# Patient Record
Sex: Male | Born: 1940 | Race: White | Hispanic: No | Marital: Married | Smoking: Former smoker
Health system: Southern US, Community
[De-identification: ages and names within clinical notes are randomized; demographics above are authoritative.]

## PROBLEM LIST (undated history)

## (undated) DIAGNOSIS — R0789 Other chest pain: Secondary | ICD-10-CM

## (undated) DIAGNOSIS — N390 Urinary tract infection, site not specified: Secondary | ICD-10-CM

## (undated) DIAGNOSIS — H9190 Unspecified hearing loss, unspecified ear: Secondary | ICD-10-CM

## (undated) DIAGNOSIS — F329 Major depressive disorder, single episode, unspecified: Secondary | ICD-10-CM

## (undated) DIAGNOSIS — F32A Depression, unspecified: Secondary | ICD-10-CM

## (undated) DIAGNOSIS — M109 Gout, unspecified: Secondary | ICD-10-CM

## (undated) DIAGNOSIS — B351 Tinea unguium: Secondary | ICD-10-CM

## (undated) DIAGNOSIS — E785 Hyperlipidemia, unspecified: Secondary | ICD-10-CM

## (undated) DIAGNOSIS — E11319 Type 2 diabetes mellitus with unspecified diabetic retinopathy without macular edema: Secondary | ICD-10-CM

## (undated) DIAGNOSIS — I10 Essential (primary) hypertension: Secondary | ICD-10-CM

## (undated) DIAGNOSIS — E119 Type 2 diabetes mellitus without complications: Secondary | ICD-10-CM

## (undated) HISTORY — DX: Depression, unspecified: F32.A

## (undated) HISTORY — DX: Tinea unguium: B35.1

## (undated) HISTORY — PX: COLONOSCOPY: SHX174

## (undated) HISTORY — PX: CHOLECYSTECTOMY: SHX55

## (undated) HISTORY — DX: Unspecified hearing loss, unspecified ear: H91.90

## (undated) HISTORY — DX: Other chest pain: R07.89

## (undated) HISTORY — DX: Urinary tract infection, site not specified: N39.0

## (undated) HISTORY — DX: Essential (primary) hypertension: I10

## (undated) HISTORY — PX: TONSILLECTOMY: SUR1361

## (undated) HISTORY — DX: Gout, unspecified: M10.9

## (undated) HISTORY — DX: Type 2 diabetes mellitus without complications: E11.9

## (undated) HISTORY — DX: Major depressive disorder, single episode, unspecified: F32.9

## (undated) HISTORY — PX: CATARACT EXTRACTION, BILATERAL: SHX1313

---

## 1998-05-25 ENCOUNTER — Encounter: Payer: Self-pay | Admitting: Cardiology

## 1998-05-25 ENCOUNTER — Ambulatory Visit (HOSPITAL_COMMUNITY): Admission: RE | Admit: 1998-05-25 | Discharge: 1998-05-25 | Payer: Self-pay | Admitting: Cardiology

## 2004-02-23 ENCOUNTER — Encounter (INDEPENDENT_AMBULATORY_CARE_PROVIDER_SITE_OTHER): Payer: Self-pay | Admitting: *Deleted

## 2004-02-23 ENCOUNTER — Ambulatory Visit (HOSPITAL_COMMUNITY): Admission: RE | Admit: 2004-02-23 | Discharge: 2004-02-23 | Payer: Self-pay | Admitting: Gastroenterology

## 2008-11-02 ENCOUNTER — Observation Stay (HOSPITAL_COMMUNITY): Admission: EM | Admit: 2008-11-02 | Discharge: 2008-11-03 | Payer: Self-pay | Admitting: Emergency Medicine

## 2010-07-24 LAB — CBC
HCT: 42.7 % (ref 39.0–52.0)
Hemoglobin: 14.6 g/dL (ref 13.0–17.0)
MCHC: 34.2 g/dL (ref 30.0–36.0)
MCV: 89.5 fL (ref 78.0–100.0)
Platelets: 264 10*3/uL (ref 150–400)
RBC: 4.77 MIL/uL (ref 4.22–5.81)
RDW: 13.7 % (ref 11.5–15.5)
WBC: 7.9 10*3/uL (ref 4.0–10.5)

## 2010-07-24 LAB — DIFFERENTIAL
Basophils Absolute: 0.1 10*3/uL (ref 0.0–0.1)
Basophils Relative: 2 % — ABNORMAL HIGH (ref 0–1)
Eosinophils Absolute: 0.1 10*3/uL (ref 0.0–0.7)
Monocytes Absolute: 0.7 10*3/uL (ref 0.1–1.0)
Monocytes Relative: 9 % (ref 3–12)
Neutrophils Relative %: 59 % (ref 43–77)

## 2010-07-24 LAB — POCT I-STAT, CHEM 8
Calcium, Ion: 1.19 mmol/L (ref 1.12–1.32)
Glucose, Bld: 156 mg/dL — ABNORMAL HIGH (ref 70–99)
HCT: 44 % (ref 39.0–52.0)
Hemoglobin: 15 g/dL (ref 13.0–17.0)
Potassium: 4.1 mEq/L (ref 3.5–5.1)

## 2010-07-24 LAB — GLUCOSE, CAPILLARY: Glucose-Capillary: 163 mg/dL — ABNORMAL HIGH (ref 70–99)

## 2010-08-30 NOTE — Op Note (Signed)
NAME:  TATE, ZAGAL NO.:  [id number]   MEDICAL RECORD NO.:  [id number]          PATIENT TYPE:  OBV   LOCATION:  0098                         FACILITY:  [facility]   PHYSICIAN:  Artist Pais. Weingold, M.D.DATE OF BIRTH:  April 13, 1941   DATE OF PROCEDURE:  11/02/2008  DATE OF DISCHARGE:                               OPERATIVE REPORT   PREOPERATIVE DIAGNOSIS:  Right long, ring, and small finger deep  laceration, status post chain saw injury.   POSTOPERATIVE DIAGNOSIS:  Right long, ring, and small finger deep  laceration, status post chain saw injury.   OPERATION/PROCEDURE:  Exploration and repair right small finger ulnar  digital artery and nerve and right ring and long finger radial digital  artery and nerve.   ANESTHESIA:  General.   TOURNIQUET TIME:  Two hours.   COMPLICATIONS:  None.   DRAINS:  None.   DESCRIPTION OF PROCEDURE:  The patient taken to operating suite.  After  induction of adequate general anesthesia, right upper extremity is  prepped and draped in sterile fashion.  An Esmarch was used to  exsanguinate the limb.  Tourniquet inflated to 265 mmHg.  At this point  in time the right hand was explored.  There were lacerations to the  long, ring and small fingers.  They were each extended proximally and  distally in gentle S type fashion.  Visualization revealed complete  lacerations of the radial, neurovascular bundles of the long ring finger  and the ulnar neurovascular bundle of the small finger.  Tendons were  all intact.  Microscope was brought into the field.  Under microscopic  magnification, the ulnar artery and ulnar nerves were repaired using a  10-0 nylon in the digital arteries and 9-0 nylon on the digital nerves  on the radial side of the long and ring fingers and ulnar side of the  small finger.  After this was done, wound was irrigated and loosely  closed with 4-0 Vicryl Rapide suture.  Xeroform, 4 x 4s, fluffs and a  dorsal extension  block splint were applied.  The patient tolerated micro  procedures well and went to the recovery room in stable condition.      Artist Pais Mina Marble, M.D.  Electronically Signed     MAW/MEDQ  D:  11/02/2008  T:  11/03/2008  Job:  [id number]

## 2010-08-30 NOTE — Consult Note (Signed)
NAME:  Vincent Alvarez, Vincent Alvarez NO.:  [id number]   MEDICAL RECORD NO.:  [id number]          PATIENT TYPE:  OBV   LOCATION:  0098                         [facility]ACILITY:  [facility]   PHYSICIAN:  Artist Pais. Weingold, M.D.DATE OF BIRTH:  02/20/1941   DATE OF CONSULTATION:  DATE OF DISCHARGE:                                 CONSULTATION   REASON FOR CONSULTATION:  [name] is a 70 year old right-hand  dominant male who was working with a chain saw, accidentally lacerated  the right hand of long, ring and small fingers over the proximal  phalanges with arterial bleeding noted in the emergency room.  He is an  otherwise healthy 70 year old male.   ALLERGIES:  He has no known drug allergies.   MEDICATIONS:  He is currently on, Glucophage, Januvia, Actos, Prozac and  one antihypertensive which he cannot recall the name of.   ALLERGIES:  NO KNOWN DRUG ALLERGIES.   SOCIAL HISTORY:  He occasionally drinks alcohol, does not smoke.   PAST MEDICAL HISTORY:  Non-insulin diabetes mellitus and hypertension.   PHYSICAL EXAMINATION:  GENERAL:  A well-nourished male, pleasant, alert  and oriented x3.  Has deep lacerations of long, ring and small fingers.  The radial side of the long and ring finger, ulnar side of the small  finger with arterial bleeding.  X-ray showed small possible fracture of  the corners of the proximal phalanges of long and ring finger.  No other  significant findings.   HISTORY OF PRESENT ILLNESS:  A 70 year old male with deep lacerations to  the volar aspect of his dominant right long, ring and small fingers with  probable tendon, artery and nerve damage.  I discussed with both Mr.  Smolinsky and his wife we will take him to the operating room to explore  and repair as necessary the above injuries.      Artist Pais Mina Marble, M.D.  Electronically Signed     MAW/MEDQ  D:  11/02/2008  T:  11/03/2008  Job:  [id number]

## 2011-05-02 DIAGNOSIS — E1139 Type 2 diabetes mellitus with other diabetic ophthalmic complication: Secondary | ICD-10-CM | POA: Diagnosis not present

## 2011-05-02 DIAGNOSIS — E11319 Type 2 diabetes mellitus with unspecified diabetic retinopathy without macular edema: Secondary | ICD-10-CM | POA: Diagnosis not present

## 2011-05-25 DIAGNOSIS — H40009 Preglaucoma, unspecified, unspecified eye: Secondary | ICD-10-CM | POA: Diagnosis not present

## 2011-05-25 DIAGNOSIS — E1139 Type 2 diabetes mellitus with other diabetic ophthalmic complication: Secondary | ICD-10-CM | POA: Diagnosis not present

## 2011-06-06 DIAGNOSIS — H40009 Preglaucoma, unspecified, unspecified eye: Secondary | ICD-10-CM | POA: Diagnosis not present

## 2011-06-06 DIAGNOSIS — H2510 Age-related nuclear cataract, unspecified eye: Secondary | ICD-10-CM | POA: Diagnosis not present

## 2011-06-06 DIAGNOSIS — E1139 Type 2 diabetes mellitus with other diabetic ophthalmic complication: Secondary | ICD-10-CM | POA: Diagnosis not present

## 2011-08-28 DIAGNOSIS — E119 Type 2 diabetes mellitus without complications: Secondary | ICD-10-CM | POA: Diagnosis not present

## 2011-08-28 DIAGNOSIS — E785 Hyperlipidemia, unspecified: Secondary | ICD-10-CM | POA: Diagnosis not present

## 2011-08-28 DIAGNOSIS — I10 Essential (primary) hypertension: Secondary | ICD-10-CM | POA: Diagnosis not present

## 2011-08-28 DIAGNOSIS — B351 Tinea unguium: Secondary | ICD-10-CM | POA: Diagnosis not present

## 2011-09-13 DIAGNOSIS — B351 Tinea unguium: Secondary | ICD-10-CM | POA: Diagnosis not present

## 2011-09-13 DIAGNOSIS — M79609 Pain in unspecified limb: Secondary | ICD-10-CM | POA: Diagnosis not present

## 2011-12-05 DIAGNOSIS — H40019 Open angle with borderline findings, low risk, unspecified eye: Secondary | ICD-10-CM | POA: Diagnosis not present

## 2011-12-05 DIAGNOSIS — H40059 Ocular hypertension, unspecified eye: Secondary | ICD-10-CM | POA: Diagnosis not present

## 2012-01-30 DIAGNOSIS — E1139 Type 2 diabetes mellitus with other diabetic ophthalmic complication: Secondary | ICD-10-CM | POA: Diagnosis not present

## 2012-01-30 DIAGNOSIS — E11319 Type 2 diabetes mellitus with unspecified diabetic retinopathy without macular edema: Secondary | ICD-10-CM | POA: Diagnosis not present

## 2012-02-21 DIAGNOSIS — M542 Cervicalgia: Secondary | ICD-10-CM | POA: Diagnosis not present

## 2012-02-27 DIAGNOSIS — R011 Cardiac murmur, unspecified: Secondary | ICD-10-CM | POA: Diagnosis not present

## 2012-02-27 DIAGNOSIS — E119 Type 2 diabetes mellitus without complications: Secondary | ICD-10-CM | POA: Diagnosis not present

## 2012-02-27 DIAGNOSIS — Z23 Encounter for immunization: Secondary | ICD-10-CM | POA: Diagnosis not present

## 2012-02-27 DIAGNOSIS — Z125 Encounter for screening for malignant neoplasm of prostate: Secondary | ICD-10-CM | POA: Diagnosis not present

## 2012-02-27 DIAGNOSIS — R030 Elevated blood-pressure reading, without diagnosis of hypertension: Secondary | ICD-10-CM | POA: Diagnosis not present

## 2012-02-27 DIAGNOSIS — Z0000 Encounter for general adult medical examination without abnormal findings: Secondary | ICD-10-CM | POA: Diagnosis not present

## 2012-03-01 DIAGNOSIS — R011 Cardiac murmur, unspecified: Secondary | ICD-10-CM | POA: Diagnosis not present

## 2012-03-06 ENCOUNTER — Other Ambulatory Visit: Payer: Self-pay | Admitting: Gastroenterology

## 2012-03-06 DIAGNOSIS — Z86010 Personal history of colonic polyps: Secondary | ICD-10-CM | POA: Diagnosis not present

## 2012-03-06 DIAGNOSIS — Z09 Encounter for follow-up examination after completed treatment for conditions other than malignant neoplasm: Secondary | ICD-10-CM | POA: Diagnosis not present

## 2012-03-06 DIAGNOSIS — D126 Benign neoplasm of colon, unspecified: Secondary | ICD-10-CM | POA: Diagnosis not present

## 2012-03-06 DIAGNOSIS — Z800 Family history of malignant neoplasm of digestive organs: Secondary | ICD-10-CM | POA: Diagnosis not present

## 2012-06-11 DIAGNOSIS — E1139 Type 2 diabetes mellitus with other diabetic ophthalmic complication: Secondary | ICD-10-CM | POA: Diagnosis not present

## 2012-06-11 DIAGNOSIS — H2510 Age-related nuclear cataract, unspecified eye: Secondary | ICD-10-CM | POA: Diagnosis not present

## 2012-06-11 DIAGNOSIS — H52209 Unspecified astigmatism, unspecified eye: Secondary | ICD-10-CM | POA: Diagnosis not present

## 2012-06-11 DIAGNOSIS — E11319 Type 2 diabetes mellitus with unspecified diabetic retinopathy without macular edema: Secondary | ICD-10-CM | POA: Diagnosis not present

## 2012-08-08 DIAGNOSIS — S298XXA Other specified injuries of thorax, initial encounter: Secondary | ICD-10-CM | POA: Diagnosis not present

## 2012-08-08 DIAGNOSIS — E119 Type 2 diabetes mellitus without complications: Secondary | ICD-10-CM | POA: Diagnosis not present

## 2012-08-26 DIAGNOSIS — E119 Type 2 diabetes mellitus without complications: Secondary | ICD-10-CM | POA: Diagnosis not present

## 2012-08-26 DIAGNOSIS — F329 Major depressive disorder, single episode, unspecified: Secondary | ICD-10-CM | POA: Diagnosis not present

## 2012-08-26 DIAGNOSIS — E785 Hyperlipidemia, unspecified: Secondary | ICD-10-CM | POA: Diagnosis not present

## 2012-08-26 DIAGNOSIS — I10 Essential (primary) hypertension: Secondary | ICD-10-CM | POA: Diagnosis not present

## 2012-10-09 ENCOUNTER — Other Ambulatory Visit: Payer: Self-pay | Admitting: Gastroenterology

## 2012-10-09 DIAGNOSIS — Z86010 Personal history of colonic polyps: Secondary | ICD-10-CM | POA: Diagnosis not present

## 2012-10-09 DIAGNOSIS — D126 Benign neoplasm of colon, unspecified: Secondary | ICD-10-CM | POA: Diagnosis not present

## 2012-10-09 DIAGNOSIS — K620 Anal polyp: Secondary | ICD-10-CM | POA: Diagnosis not present

## 2012-10-09 DIAGNOSIS — K621 Rectal polyp: Secondary | ICD-10-CM | POA: Diagnosis not present

## 2012-10-09 DIAGNOSIS — Z09 Encounter for follow-up examination after completed treatment for conditions other than malignant neoplasm: Secondary | ICD-10-CM | POA: Diagnosis not present

## 2013-01-30 DIAGNOSIS — Z23 Encounter for immunization: Secondary | ICD-10-CM | POA: Diagnosis not present

## 2013-03-19 DIAGNOSIS — N400 Enlarged prostate without lower urinary tract symptoms: Secondary | ICD-10-CM | POA: Diagnosis not present

## 2013-03-19 DIAGNOSIS — Z125 Encounter for screening for malignant neoplasm of prostate: Secondary | ICD-10-CM | POA: Diagnosis not present

## 2013-03-19 DIAGNOSIS — M109 Gout, unspecified: Secondary | ICD-10-CM | POA: Diagnosis not present

## 2013-03-19 DIAGNOSIS — Z0000 Encounter for general adult medical examination without abnormal findings: Secondary | ICD-10-CM | POA: Diagnosis not present

## 2013-03-19 DIAGNOSIS — E119 Type 2 diabetes mellitus without complications: Secondary | ICD-10-CM | POA: Diagnosis not present

## 2013-03-20 ENCOUNTER — Other Ambulatory Visit: Payer: Self-pay | Admitting: Family Medicine

## 2013-03-20 DIAGNOSIS — Z139 Encounter for screening, unspecified: Secondary | ICD-10-CM

## 2013-03-26 ENCOUNTER — Ambulatory Visit
Admission: RE | Admit: 2013-03-26 | Discharge: 2013-03-26 | Disposition: A | Discharge status: Home / Self Care | Payer: Medicare Other | Source: Ambulatory Visit | Attending: Family Medicine | Admitting: Family Medicine

## 2013-03-26 DIAGNOSIS — Z139 Encounter for screening, unspecified: Secondary | ICD-10-CM

## 2013-03-26 DIAGNOSIS — Z136 Encounter for screening for cardiovascular disorders: Secondary | ICD-10-CM | POA: Diagnosis not present

## 2013-03-27 DIAGNOSIS — S90129A Contusion of unspecified lesser toe(s) without damage to nail, initial encounter: Secondary | ICD-10-CM | POA: Diagnosis not present

## 2013-03-27 DIAGNOSIS — S9030XA Contusion of unspecified foot, initial encounter: Secondary | ICD-10-CM | POA: Diagnosis not present

## 2013-06-17 DIAGNOSIS — E11319 Type 2 diabetes mellitus with unspecified diabetic retinopathy without macular edema: Secondary | ICD-10-CM | POA: Diagnosis not present

## 2013-06-17 DIAGNOSIS — H40019 Open angle with borderline findings, low risk, unspecified eye: Secondary | ICD-10-CM | POA: Diagnosis not present

## 2013-06-17 DIAGNOSIS — E1139 Type 2 diabetes mellitus with other diabetic ophthalmic complication: Secondary | ICD-10-CM | POA: Diagnosis not present

## 2013-06-17 DIAGNOSIS — H2510 Age-related nuclear cataract, unspecified eye: Secondary | ICD-10-CM | POA: Diagnosis not present

## 2013-07-30 DIAGNOSIS — H6120 Impacted cerumen, unspecified ear: Secondary | ICD-10-CM | POA: Diagnosis not present

## 2013-07-30 DIAGNOSIS — H903 Sensorineural hearing loss, bilateral: Secondary | ICD-10-CM | POA: Diagnosis not present

## 2013-09-18 DIAGNOSIS — I10 Essential (primary) hypertension: Secondary | ICD-10-CM | POA: Diagnosis not present

## 2013-09-18 DIAGNOSIS — Z23 Encounter for immunization: Secondary | ICD-10-CM | POA: Diagnosis not present

## 2013-09-18 DIAGNOSIS — F3289 Other specified depressive episodes: Secondary | ICD-10-CM | POA: Diagnosis not present

## 2013-09-18 DIAGNOSIS — J309 Allergic rhinitis, unspecified: Secondary | ICD-10-CM | POA: Diagnosis not present

## 2013-09-18 DIAGNOSIS — F329 Major depressive disorder, single episode, unspecified: Secondary | ICD-10-CM | POA: Diagnosis not present

## 2013-09-18 DIAGNOSIS — N400 Enlarged prostate without lower urinary tract symptoms: Secondary | ICD-10-CM | POA: Diagnosis not present

## 2013-09-18 DIAGNOSIS — E119 Type 2 diabetes mellitus without complications: Secondary | ICD-10-CM | POA: Diagnosis not present

## 2013-09-18 DIAGNOSIS — B351 Tinea unguium: Secondary | ICD-10-CM | POA: Diagnosis not present

## 2013-12-16 DIAGNOSIS — E1139 Type 2 diabetes mellitus with other diabetic ophthalmic complication: Secondary | ICD-10-CM | POA: Diagnosis not present

## 2013-12-19 ENCOUNTER — Encounter: Payer: Self-pay | Admitting: *Deleted

## 2014-01-19 DIAGNOSIS — Z23 Encounter for immunization: Secondary | ICD-10-CM | POA: Diagnosis not present

## 2014-03-24 DIAGNOSIS — Z0000 Encounter for general adult medical examination without abnormal findings: Secondary | ICD-10-CM | POA: Diagnosis not present

## 2014-03-24 DIAGNOSIS — E119 Type 2 diabetes mellitus without complications: Secondary | ICD-10-CM | POA: Diagnosis not present

## 2014-03-24 DIAGNOSIS — Z125 Encounter for screening for malignant neoplasm of prostate: Secondary | ICD-10-CM | POA: Diagnosis not present

## 2014-03-24 DIAGNOSIS — E785 Hyperlipidemia, unspecified: Secondary | ICD-10-CM | POA: Diagnosis not present

## 2014-03-24 DIAGNOSIS — I10 Essential (primary) hypertension: Secondary | ICD-10-CM | POA: Diagnosis not present

## 2014-07-14 DIAGNOSIS — E11339 Type 2 diabetes mellitus with moderate nonproliferative diabetic retinopathy without macular edema: Secondary | ICD-10-CM | POA: Diagnosis not present

## 2014-08-03 DIAGNOSIS — E11339 Type 2 diabetes mellitus with moderate nonproliferative diabetic retinopathy without macular edema: Secondary | ICD-10-CM | POA: Diagnosis not present

## 2014-08-03 DIAGNOSIS — H52203 Unspecified astigmatism, bilateral: Secondary | ICD-10-CM | POA: Diagnosis not present

## 2014-08-03 DIAGNOSIS — H2513 Age-related nuclear cataract, bilateral: Secondary | ICD-10-CM | POA: Diagnosis not present

## 2014-08-03 DIAGNOSIS — H40013 Open angle with borderline findings, low risk, bilateral: Secondary | ICD-10-CM | POA: Diagnosis not present

## 2014-09-23 DIAGNOSIS — E11319 Type 2 diabetes mellitus with unspecified diabetic retinopathy without macular edema: Secondary | ICD-10-CM | POA: Diagnosis not present

## 2014-11-23 DIAGNOSIS — R21 Rash and other nonspecific skin eruption: Secondary | ICD-10-CM | POA: Diagnosis not present

## 2014-12-31 DIAGNOSIS — H43813 Vitreous degeneration, bilateral: Secondary | ICD-10-CM | POA: Diagnosis not present

## 2014-12-31 DIAGNOSIS — E11339 Type 2 diabetes mellitus with moderate nonproliferative diabetic retinopathy without macular edema: Secondary | ICD-10-CM | POA: Diagnosis not present

## 2015-02-02 DIAGNOSIS — Z23 Encounter for immunization: Secondary | ICD-10-CM | POA: Diagnosis not present

## 2015-03-26 DIAGNOSIS — Z125 Encounter for screening for malignant neoplasm of prostate: Secondary | ICD-10-CM | POA: Diagnosis not present

## 2015-03-26 DIAGNOSIS — I10 Essential (primary) hypertension: Secondary | ICD-10-CM | POA: Diagnosis not present

## 2015-03-26 DIAGNOSIS — F324 Major depressive disorder, single episode, in partial remission: Secondary | ICD-10-CM | POA: Diagnosis not present

## 2015-03-26 DIAGNOSIS — E785 Hyperlipidemia, unspecified: Secondary | ICD-10-CM | POA: Diagnosis not present

## 2015-03-26 DIAGNOSIS — E11319 Type 2 diabetes mellitus with unspecified diabetic retinopathy without macular edema: Secondary | ICD-10-CM | POA: Diagnosis not present

## 2015-03-26 DIAGNOSIS — B351 Tinea unguium: Secondary | ICD-10-CM | POA: Diagnosis not present

## 2015-03-26 DIAGNOSIS — Z7984 Long term (current) use of oral hypoglycemic drugs: Secondary | ICD-10-CM | POA: Diagnosis not present

## 2015-03-26 DIAGNOSIS — N400 Enlarged prostate without lower urinary tract symptoms: Secondary | ICD-10-CM | POA: Diagnosis not present

## 2015-08-04 DIAGNOSIS — H524 Presbyopia: Secondary | ICD-10-CM | POA: Diagnosis not present

## 2015-08-04 DIAGNOSIS — E113393 Type 2 diabetes mellitus with moderate nonproliferative diabetic retinopathy without macular edema, bilateral: Secondary | ICD-10-CM | POA: Diagnosis not present

## 2015-08-04 DIAGNOSIS — H2513 Age-related nuclear cataract, bilateral: Secondary | ICD-10-CM | POA: Diagnosis not present

## 2015-08-19 DIAGNOSIS — H25811 Combined forms of age-related cataract, right eye: Secondary | ICD-10-CM | POA: Diagnosis not present

## 2015-08-19 DIAGNOSIS — H2511 Age-related nuclear cataract, right eye: Secondary | ICD-10-CM | POA: Diagnosis not present

## 2015-08-26 DIAGNOSIS — H2512 Age-related nuclear cataract, left eye: Secondary | ICD-10-CM | POA: Diagnosis not present

## 2015-08-26 DIAGNOSIS — H25812 Combined forms of age-related cataract, left eye: Secondary | ICD-10-CM | POA: Diagnosis not present

## 2015-09-14 DIAGNOSIS — J019 Acute sinusitis, unspecified: Secondary | ICD-10-CM | POA: Diagnosis not present

## 2015-09-14 DIAGNOSIS — J209 Acute bronchitis, unspecified: Secondary | ICD-10-CM | POA: Diagnosis not present

## 2015-09-28 DIAGNOSIS — E11319 Type 2 diabetes mellitus with unspecified diabetic retinopathy without macular edema: Secondary | ICD-10-CM | POA: Diagnosis not present

## 2015-09-28 DIAGNOSIS — I10 Essential (primary) hypertension: Secondary | ICD-10-CM | POA: Diagnosis not present

## 2015-09-28 DIAGNOSIS — Z7984 Long term (current) use of oral hypoglycemic drugs: Secondary | ICD-10-CM | POA: Diagnosis not present

## 2015-09-30 DIAGNOSIS — E113393 Type 2 diabetes mellitus with moderate nonproliferative diabetic retinopathy without macular edema, bilateral: Secondary | ICD-10-CM | POA: Diagnosis not present

## 2015-11-16 DIAGNOSIS — L509 Urticaria, unspecified: Secondary | ICD-10-CM | POA: Diagnosis not present

## 2015-11-18 DIAGNOSIS — R21 Rash and other nonspecific skin eruption: Secondary | ICD-10-CM | POA: Diagnosis not present

## 2015-11-18 DIAGNOSIS — L509 Urticaria, unspecified: Secondary | ICD-10-CM | POA: Diagnosis not present

## 2015-11-22 DIAGNOSIS — L509 Urticaria, unspecified: Secondary | ICD-10-CM | POA: Diagnosis not present

## 2015-11-22 DIAGNOSIS — R21 Rash and other nonspecific skin eruption: Secondary | ICD-10-CM | POA: Diagnosis not present

## 2015-11-22 DIAGNOSIS — Z7984 Long term (current) use of oral hypoglycemic drugs: Secondary | ICD-10-CM | POA: Diagnosis not present

## 2015-11-22 DIAGNOSIS — E11319 Type 2 diabetes mellitus with unspecified diabetic retinopathy without macular edema: Secondary | ICD-10-CM | POA: Diagnosis not present

## 2015-11-22 DIAGNOSIS — L239 Allergic contact dermatitis, unspecified cause: Secondary | ICD-10-CM | POA: Diagnosis not present

## 2015-12-18 ENCOUNTER — Emergency Department (HOSPITAL_COMMUNITY): Payer: Medicare Other

## 2015-12-18 ENCOUNTER — Encounter (HOSPITAL_COMMUNITY): Payer: Self-pay

## 2015-12-18 ENCOUNTER — Observation Stay (HOSPITAL_COMMUNITY)
Admission: EM | Admit: 2015-12-18 | Discharge: 2015-12-20 | Disposition: A | Discharge status: Home / Self Care | Payer: Medicare Other | Attending: Family Medicine | Admitting: Family Medicine

## 2015-12-18 DIAGNOSIS — G459 Transient cerebral ischemic attack, unspecified: Principal | ICD-10-CM | POA: Insufficient documentation

## 2015-12-18 DIAGNOSIS — E11319 Type 2 diabetes mellitus with unspecified diabetic retinopathy without macular edema: Secondary | ICD-10-CM | POA: Insufficient documentation

## 2015-12-18 DIAGNOSIS — I10 Essential (primary) hypertension: Secondary | ICD-10-CM | POA: Diagnosis not present

## 2015-12-18 DIAGNOSIS — Z66 Do not resuscitate: Secondary | ICD-10-CM | POA: Diagnosis not present

## 2015-12-18 DIAGNOSIS — Z6835 Body mass index (BMI) 35.0-35.9, adult: Secondary | ICD-10-CM | POA: Insufficient documentation

## 2015-12-18 DIAGNOSIS — R4701 Aphasia: Secondary | ICD-10-CM | POA: Insufficient documentation

## 2015-12-18 DIAGNOSIS — E1169 Type 2 diabetes mellitus with other specified complication: Secondary | ICD-10-CM | POA: Insufficient documentation

## 2015-12-18 DIAGNOSIS — R200 Anesthesia of skin: Secondary | ICD-10-CM | POA: Diagnosis not present

## 2015-12-18 DIAGNOSIS — Z7984 Long term (current) use of oral hypoglycemic drugs: Secondary | ICD-10-CM | POA: Diagnosis not present

## 2015-12-18 DIAGNOSIS — E669 Obesity, unspecified: Secondary | ICD-10-CM | POA: Insufficient documentation

## 2015-12-18 DIAGNOSIS — Z87891 Personal history of nicotine dependence: Secondary | ICD-10-CM | POA: Insufficient documentation

## 2015-12-18 DIAGNOSIS — F329 Major depressive disorder, single episode, unspecified: Secondary | ICD-10-CM

## 2015-12-18 DIAGNOSIS — Z79899 Other long term (current) drug therapy: Secondary | ICD-10-CM | POA: Insufficient documentation

## 2015-12-18 DIAGNOSIS — E119 Type 2 diabetes mellitus without complications: Secondary | ICD-10-CM

## 2015-12-18 DIAGNOSIS — G451 Carotid artery syndrome (hemispheric): Secondary | ICD-10-CM

## 2015-12-18 DIAGNOSIS — R29810 Facial weakness: Secondary | ICD-10-CM | POA: Diagnosis present

## 2015-12-18 DIAGNOSIS — F32A Depression, unspecified: Secondary | ICD-10-CM | POA: Diagnosis present

## 2015-12-18 DIAGNOSIS — E785 Hyperlipidemia, unspecified: Secondary | ICD-10-CM | POA: Insufficient documentation

## 2015-12-18 DIAGNOSIS — I639 Cerebral infarction, unspecified: Secondary | ICD-10-CM

## 2015-12-18 HISTORY — DX: Hyperlipidemia, unspecified: E78.5

## 2015-12-18 HISTORY — DX: Type 2 diabetes mellitus with unspecified diabetic retinopathy without macular edema: E11.319

## 2015-12-18 LAB — COMPREHENSIVE METABOLIC PANEL
ALK PHOS: 68 U/L (ref 38–126)
ALT: 25 U/L (ref 17–63)
AST: 20 U/L (ref 15–41)
Albumin: 4.4 g/dL (ref 3.5–5.0)
Anion gap: 8 (ref 5–15)
BILIRUBIN TOTAL: 0.7 mg/dL (ref 0.3–1.2)
BUN: 19 mg/dL (ref 6–20)
CALCIUM: 9.9 mg/dL (ref 8.9–10.3)
CO2: 26 mmol/L (ref 22–32)
CREATININE: 0.92 mg/dL (ref 0.61–1.24)
Chloride: 101 mmol/L (ref 101–111)
GFR calc Af Amer: >60 mL/min (ref >60)
Glucose, Bld: 166 mg/dL — ABNORMAL HIGH (ref 65–99)
Potassium: 4.1 mmol/L (ref 3.5–5.1)
Sodium: 135 mmol/L (ref 135–145)
TOTAL PROTEIN: 7.3 g/dL (ref 6.5–8.1)

## 2015-12-18 LAB — DIFFERENTIAL
BASOS ABS: 0 10*3/uL (ref 0.0–0.1)
Basophils Relative: 0 %
Eosinophils Absolute: 0.2 10*3/uL (ref 0.0–0.7)
Eosinophils Relative: 3 %
LYMPHS ABS: 2 10*3/uL (ref 0.7–4.0)
Lymphocytes Relative: 27 %
MONOS PCT: 10 %
Monocytes Absolute: 0.7 10*3/uL (ref 0.1–1.0)
NEUTROS ABS: 4.4 10*3/uL (ref 1.7–7.7)
Neutrophils Relative %: 60 %

## 2015-12-18 LAB — CBC
HEMATOCRIT: 41 % (ref 39.0–52.0)
HEMOGLOBIN: 14.2 g/dL (ref 13.0–17.0)
MCH: 30.4 pg (ref 26.0–34.0)
MCHC: 34.6 g/dL (ref 30.0–36.0)
MCV: 87.8 fL (ref 78.0–100.0)
Platelets: 209 10*3/uL (ref 150–400)
RBC: 4.67 MIL/uL (ref 4.22–5.81)
RDW: 13.6 % (ref 11.5–15.5)
WBC: 7.4 10*3/uL (ref 4.0–10.5)

## 2015-12-18 LAB — PROTIME-INR
INR: 1.01
Prothrombin Time: 13.3 seconds (ref 11.4–15.2)

## 2015-12-18 LAB — I-STAT TROPONIN, ED: TROPONIN I, POC: 0 ng/mL (ref 0.00–0.08)

## 2015-12-18 LAB — GLUCOSE, CAPILLARY: Glucose-Capillary: 149 mg/dL — ABNORMAL HIGH (ref 65–99)

## 2015-12-18 LAB — I-STAT CHEM 8, ED
BUN: 18 mg/dL (ref 6–20)
CREATININE: 1 mg/dL (ref 0.61–1.24)
Calcium, Ion: 1.22 mmol/L (ref 1.15–1.40)
Chloride: 97 mmol/L — ABNORMAL LOW (ref 101–111)
Glucose, Bld: 158 mg/dL — ABNORMAL HIGH (ref 65–99)
HEMATOCRIT: 43 % (ref 39.0–52.0)
Hemoglobin: 14.6 g/dL (ref 13.0–17.0)
Potassium: 4.1 mmol/L (ref 3.5–5.1)
Sodium: 136 mmol/L (ref 135–145)
TCO2: 26 mmol/L (ref 0–100)

## 2015-12-18 LAB — CBG MONITORING, ED: Glucose-Capillary: 158 mg/dL — ABNORMAL HIGH (ref 65–99)

## 2015-12-18 LAB — MRSA PCR SCREENING: MRSA by PCR: NEGATIVE

## 2015-12-18 LAB — APTT: APTT: 30 s (ref 24–36)

## 2015-12-18 MED ORDER — INSULIN ASPART 100 UNIT/ML ~~LOC~~ SOLN
0.0000 [IU] | Freq: Three times a day (TID) | SUBCUTANEOUS | Status: DC
  Administered 2015-12-19: 2 [IU] via SUBCUTANEOUS
  Administered 2015-12-19: 3 [IU] via SUBCUTANEOUS
  Administered 2015-12-19: 5 [IU] via SUBCUTANEOUS
  Administered 2015-12-20: 2 [IU] via SUBCUTANEOUS
  Administered 2015-12-20: 3 [IU] via SUBCUTANEOUS

## 2015-12-18 MED ORDER — ASPIRIN 325 MG PO TABS
325.0000 mg | ORAL_TABLET | Freq: Every day | ORAL | Status: DC
  Administered 2015-12-19 – 2015-12-20 (×2): 325 mg via ORAL
  Filled 2015-12-18 (×2): qty 1

## 2015-12-18 MED ORDER — FLUOXETINE HCL 20 MG PO CAPS
20.0000 mg | ORAL_CAPSULE | Freq: Every day | ORAL | Status: DC
  Administered 2015-12-19 – 2015-12-20 (×2): 20 mg via ORAL
  Filled 2015-12-18 (×2): qty 1

## 2015-12-18 MED ORDER — ASPIRIN 300 MG RE SUPP: 300.0000 mg | Freq: Every day | RECTAL | Status: DC

## 2015-12-18 MED ORDER — ATORVASTATIN CALCIUM 10 MG PO TABS
20.0000 mg | ORAL_TABLET | Freq: Every day | ORAL | Status: DC
  Administered 2015-12-19 – 2015-12-20 (×2): 20 mg via ORAL
  Filled 2015-12-18 (×2): qty 2

## 2015-12-18 MED ORDER — SODIUM CHLORIDE 0.9 % IV SOLN
INTRAVENOUS | Status: DC
  Administered 2015-12-19: 01:00:00 via INTRAVENOUS

## 2015-12-18 MED ORDER — ACETAMINOPHEN 650 MG RE SUPP: 650.0000 mg | RECTAL | Status: DC | PRN

## 2015-12-18 MED ORDER — ENOXAPARIN SODIUM 40 MG/0.4ML ~~LOC~~ SOLN
40.0000 mg | SUBCUTANEOUS | Status: DC
  Administered 2015-12-19 – 2015-12-20 (×2): 40 mg via SUBCUTANEOUS
  Filled 2015-12-18 (×2): qty 0.4

## 2015-12-18 MED ORDER — SENNOSIDES-DOCUSATE SODIUM 8.6-50 MG PO TABS: 1.0000 | ORAL_TABLET | Freq: Every evening | ORAL | Status: DC | PRN

## 2015-12-18 MED ORDER — STROKE: EARLY STAGES OF RECOVERY BOOK
Freq: Once | Status: AC
  Administered 2015-12-19: 01:00:00
  Filled 2015-12-18: qty 1

## 2015-12-18 MED ORDER — ACETAMINOPHEN 325 MG PO TABS
650.0000 mg | ORAL_TABLET | ORAL | Status: DC | PRN
  Administered 2015-12-19: 650 mg via ORAL
  Filled 2015-12-18: qty 2

## 2015-12-18 NOTE — H&P (Signed)
History and Physical    Vincent Alvarez [id number] DOB: [birth year] DOA: 12/18/2015  PCP: [facility] Consultants:  Ophthalmology - [place] Patient coming from: home - lives with wife Chief Complaint: R arm and facial numbess  HPI: Vincent Alvarez is a 75 y.o. male with medical history significant of DM with retinopathy, depression, HTN, HLD presenting with possible CVA.  Patient reports that he was watching a ball game about 1430 and noticed R-sided facial numbness.  Half of his tongue and gums on right felt like he had a shot of novicaine.  Then his arm started going numb, he couldn't hold a glass of tea without it turning over.   Also with difficulty word finding, expressive aphasia and some slurring and right facial droop noticed by wife.  Went to [facility] and was transferred to [facility].  No h/o prior.  He did have a concussion from a fall 10-12 years ago.  Only symptom now is a frontal headache which started after all of the other symptoms.    ED Course: Per Dr. Leonette Monarch: Seen at [facility] long prior to transfer for sudden onset of right-sided facial and upper extremity numbness. Code stroke initiated. Case was discussed with neurology who decided to hold TPA at that time given the improving symptoms. Patient was hemodynamically stable. CT head without acute intracranial hemorrhage. Transferred here without complication. Upon arrival symptoms have completely resolved.  Review of Systems: As per HPI; otherwise 10 point review of systems reviewed and negative.   Ambulatory Status:  Occasionally uses a cane for walking - some stumbling in the last few months without obvious reason  Past Medical History:  Diagnosis Date  . Atypical chest pain   . Depression   . Diabetes mellitus without complication ([place])   . Diabetic retinopathy ([place])   . Gout   . Hearing loss   . Hyperlipidemia   . Hypertension   . Onychomycosis   . UTI (lower urinary tract infection)     Past Surgical History:  Procedure  Laterality Date  . CATARACT EXTRACTION, BILATERAL    . CHOLECYSTECTOMY    . COLONOSCOPY    . TONSILLECTOMY      Social History   Social History  . Marital status: Married    Spouse name: N/A  . Number of children: N/A  . Years of education: N/A   Occupational History  . retired - Actor    Social History Main Topics  . Smoking status: Former Smoker    Quit date: 06/18/1985  . Smokeless tobacco: Never Used  . Alcohol use 1.2 oz/week    2 Glasses of wine per week     Comment: thinks he may be drinking too much, 2/4 + CAGE questions  . Drug use: No  . Sexual activity: Not on file   Other Topics Concern  . Not on file   Social History Narrative  . No narrative on file    Allergies  Allergen Reactions  . Shrimp [Shellfish Allergy] Nausea And Vomiting    Family History  Problem Relation Age of Onset  . CAD Mother 7  . Colon cancer Father 31    Prior to Admission medications   Medication Sig Start Date End Date Taking? Authorizing Provider  amLODipine (NORVASC) 10 MG tablet Take 10 mg by mouth every morning.    Yes Historical Provider, MD  atorvastatin (LIPITOR) 20 MG tablet Take 20 mg by mouth every morning.    Yes Historical Provider, MD  FLUoxetine (PROZAC) 20  MG capsule Take 20 mg by mouth every morning.    Yes Historical Provider, MD  metFORMIN (GLUCOPHAGE) 500 MG tablet Take 1,000 mg by mouth 2 (two) times daily with a meal.    Yes Historical Provider, MD  ramipril (ALTACE) 10 MG capsule Take 10 mg by mouth daily.   Yes Historical Provider, MD  sitaGLIPtin (JANUVIA) 100 MG tablet Take 100 mg by mouth daily.   Yes Historical Provider, MD  tamsulosin (FLOMAX) 0.4 MG CAPS capsule Take 0.4 mg by mouth daily after supper.    Yes Historical Provider, MD    Physical Exam: Vitals:   12/18/15 1830 12/18/15 1900 12/18/15 1915 12/18/15 2026  BP: 142/75 144/75 125/84 (!) 153/71  Pulse: 76 67 69 67  Resp: 17 19 12 16   Temp:    98.6 F (37 C)  TempSrc:    Oral   SpO2: 94% 95% 94% 95%  Weight:    104.1 kg (229 lb 6.4 oz)  Height:    5\' 8"  (1.727 m)     General: Appears calm and comfortable and is NAD; very pleasant and conversant Eyes:  PERRL, EOMI, normal lids, iris ENT:  grossly normal hearing, lips & tongue, mmm Neck:  no LAD, masses or thyromegaly Cardiovascular:  RRR, no m/r/g. No LE edema.  Respiratory:  CTA bilaterally, no w/r/r. Normal respiratory effort. Abdomen:  soft, ntnd, NABS Skin:  no rash or induration seen on limited exam Musculoskeletal:  grossly normal tone BUE/BLE, good ROM, no bony abnormality Psychiatric:  grossly normal mood and affect, speech fluent and appropriate, AOx3 Neurologic:  CN 2-12 grossly intact, moves all extremities in coordinated fashion, sensation intact  Labs on Admission: I have personally reviewed following labs and imaging studies  CBC:  Recent Labs Lab 12/18/15 1615 12/18/15 1628  WBC 7.4  --   NEUTROABS 4.4  --   HGB 14.2 14.6  HCT 41.0 43.0  MCV 87.8  --   PLT 209  --    Basic Metabolic Panel:  Recent Labs Lab 12/18/15 1615 12/18/15 1628  NA 135 136  K 4.1 4.1  CL 101 97*  CO2 26  --   GLUCOSE 166* 158*  BUN 19 18  CREATININE 0.92 1.00  CALCIUM 9.9  --    GFR: Estimated Creatinine Clearance: 73.5 mL/min (by C-G formula based on SCr of 1 mg/dL). Liver Function Tests:  Recent Labs Lab 12/18/15 1615  AST 20  ALT 25  ALKPHOS 68  BILITOT 0.7  PROT 7.3  ALBUMIN 4.4   No results for input(s): LIPASE, AMYLASE in the last 168 hours. No results for input(s): AMMONIA in the last 168 hours. Coagulation Profile:  Recent Labs Lab 12/18/15 1615  INR 1.01   Cardiac Enzymes: No results for input(s): CKTOTAL, CKMB, CKMBINDEX, TROPONINI in the last 168 hours. BNP (last 3 results) No results for input(s): PROBNP in the last 8760 hours. HbA1C: No results for input(s): HGBA1C in the last 72 hours. CBG:  Recent Labs Lab 12/18/15 1615 12/18/15 2131  GLUCAP 158* 149*    Lipid Profile: No results for input(s): CHOL, HDL, LDLCALC, TRIG, CHOLHDL, LDLDIRECT in the last 72 hours. Thyroid Function Tests: No results for input(s): TSH, T4TOTAL, FREET4, T3FREE, THYROIDAB in the last 72 hours. Anemia Panel: No results for input(s): VITAMINB12, FOLATE, FERRITIN, TIBC, IRON, RETICCTPCT in the last 72 hours. Urine analysis: No results found for: COLORURINE, APPEARANCEUR, LABSPEC, PHURINE, GLUCOSEU, HGBUR, BILIRUBINUR, KETONESUR, PROTEINUR, UROBILINOGEN, NITRITE, LEUKOCYTESUR  Creatinine Clearance: Estimated Creatinine Clearance:  73.5 mL/min (by C-G formula based on SCr of 1 mg/dL).  Sepsis Labs: @LABRCNTIP (procalcitonin:4,lacticidven:4) )No results found for this or any previous visit (from the past 240 hour(s)).   Radiological Exams on Admission: Ct Head Code Stroke W/o Cm  Result Date: 12/18/2015 CLINICAL DATA:  Code stroke. Right-sided weakness and numbness beginning today. EXAM: CT HEAD WITHOUT CONTRAST TECHNIQUE: Contiguous axial images were obtained from the base of the skull through the vertex without intravenous contrast. COMPARISON:  None. FINDINGS: There is no evidence of acute cortical infarct, intracranial hemorrhage, mass, midline shift, or extra-axial fluid collection. Ventricles and sulci are within normal limits for age. 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 Prior bilateral cataract extraction is noted. The visualized paranasal sinuses and mastoid air cells are clear. No acute osseous abnormality is seen. Calcified atherosclerosis is noted at the skullbase. ASPECTS [facility] Stroke Program Early CT Score) - Ganglionic level infarction (caudate, lentiform nuclei, internal capsule, insula, M1-M3 cortex): 7 - Supraganglionic infarction (M4-M6 cortex): 3 Total score (0-10  with 10 being normal): 10 IMPRESSION: 1. No evidence of acute intracranial abnormality. 2. ASPECTS is 10. 3. Extensive chronic small vessel ischemic disease. These results were called by telephone at the time of interpretation on 12/18/2015 at 4:37 pm to Dr. Julianne Rice , who verbally acknowledged these results. Electronically Signed   By: Logan Bores M.D.   On: 12/18/2015 16:37    EKG: Independently reviewed.  NSR with rate 73; RBBB and nonspecific ST changes with no evidence of acute ischemia  Assessment/Plan Active Problems:   TIA (transient ischemic attack)   Diabetes mellitus, type 2 (HCC)   Essential hypertension   Hyperlipidemia   Depression   TIA -Symptoms concerning for CVA, although symptoms did resolve quickly  -Will admit to observation status for CVA/TIA evaluation -Telemetry monitoring -MRI/MRA -Carotid dopplers -Echo -Risk stratification with FLP, A1c -ASA daily  HTN -Allow permissive HTN -Hold CCB, alpha blocker, and ACE; plan to restart in 48-72 hours  HLD -Continue Lipitor -Check FLP  DM -Will check A1c -hold Glucophage and Januvia -Cover with SSI  Depression -Continue Prozac  DVT prophylaxis:  Lovenox  Code Status: DNR - confirmed with patient Family Communication: None present Disposition Plan:  Home once clinically improved assuming PT/OT/SLP agree Consults called: Neurology (by ER)  Admission status: Observation to telemetry    Karmen Bongo MD [facility]  If 7PM-7AM, please contact night-coverage [email or web address] Password [email or web address]  12/18/2015, 11:09 PM

## 2015-12-18 NOTE — ED Notes (Signed)
Dr. Kirkpatrick Neurology at bedside. 

## 2015-12-18 NOTE — ED Provider Notes (Signed)
[facility] DEPT Provider Note   CSN: [id number] Arrival date & time: 12/18/15  1601     History   Chief Complaint Chief Complaint  Patient presents with  . Code Stroke    HPI Vincent Alvarez is a 75 y.o. male.  HPI  The patient was transferred here from [facility] with concern for acute stroke. Last known normal 2:30 this afternoon. Patient with right face and upper extremity numbness. Code stroke was initiated there and CT head unremarkable. Neurology was consulted at that time who chose to hold TPA due to the improving symptoms. They did request transfer here for admission and continued workup.  Upon arrival here patient's symptoms had completely resolved. Currently not complaining of any deficits.  Past Medical History:  Diagnosis Date  . Atypical chest pain   . Depression   . Depression   . Diabetes mellitus without complication ([place])   . Gout   . Hearing loss   . Hypertension   . Onychomycosis   . UTI (lower urinary tract infection)     There are no active problems to display for this patient.   Past Surgical History:  Procedure Laterality Date  . COLONOSCOPY         Home Medications    Prior to Admission medications   Medication Sig Start Date End Date Taking? Authorizing Provider  amLODipine (NORVASC) 10 MG tablet Take 10 mg by mouth daily.    Historical Provider, MD  atorvastatin (LIPITOR) 20 MG tablet Take 20 mg by mouth daily.    Historical Provider, MD  FLUoxetine (PROZAC) 20 MG capsule Take 20 mg by mouth daily.    Historical Provider, MD  metFORMIN (GLUCOPHAGE) 500 MG tablet Take by mouth 2 (two) times daily with a meal.    Historical Provider, MD  ramipril (ALTACE) 10 MG capsule Take 10 mg by mouth daily.    Historical Provider, MD  sitaGLIPtin (JANUVIA) 100 MG tablet Take 100 mg by mouth daily.    Historical Provider, MD  tamsulosin (FLOMAX) 0.4 MG CAPS capsule Take 0.4 mg by mouth.    Historical Provider, MD    Family History Family  History  Problem Relation Age of Onset  . Family history unknown: Yes    Social History Social History  Substance Use Topics  . Smoking status: Former Smoker    Quit date: 06/19/1995  . Smokeless tobacco: Not on file  . Alcohol use 0.6 oz/week    1 Glasses of wine per week     Allergies   Review of patient's allergies indicates no known allergies.   Review of Systems Review of Systems  Constitutional: Negative for chills, fatigue and fever.  HENT: Negative for congestion and sore throat.   Eyes: Negative for visual disturbance.  Respiratory: Negative for cough, chest tightness and shortness of breath.   Cardiovascular: Negative for chest pain and palpitations.  Gastrointestinal: Negative for abdominal pain, blood in stool, diarrhea, nausea and vomiting.  Genitourinary: Negative for decreased urine volume and difficulty urinating.  Musculoskeletal: Negative for back pain and neck stiffness.  Skin: Negative for rash.  Neurological: Negative for light-headedness and headaches.  Psychiatric/Behavioral: Negative for confusion.  All other systems reviewed and are negative.    Physical Exam Updated Vital Signs BP 120/79 (BP Location: Left Arm)   Pulse 74   Temp 98 F (36.7 C) (Oral)   Resp 16   Ht 5\' 8"  (1.727 m)   Wt 223 lb (101.2 kg)   SpO2 98%  BMI 33.91 kg/m   Physical Exam  Constitutional: He is oriented to person, place, and time. He appears well-developed and well-nourished. No distress.  HENT:  Head: Normocephalic and atraumatic.  Nose: Nose normal.  Eyes: Conjunctivae and EOM are normal. Pupils are equal, round, and reactive to light. Right eye exhibits no discharge. Left eye exhibits no discharge. No scleral icterus.  Neck: Normal range of motion. Neck supple.  Cardiovascular: Normal rate and regular rhythm.  Exam reveals no gallop and no friction rub.   No murmur heard. Pulmonary/Chest: Effort normal and breath sounds normal. No stridor. No respiratory  distress. He has no rales.  Abdominal: Soft. He exhibits no distension. There is no tenderness.  Musculoskeletal: He exhibits no edema or tenderness.  Neurological: He is alert and oriented to person, place, and time.  Mental Status: Alert and oriented to person, place, and time. Attention and concentration normal. Speech clear. Recent memory is intac  Cranial Nerves  II Visual Fields: Intact to confrontation. Visual fields intact. III, IV, VI: Pupils equal and reactive to light and near. Full eye movement without nystagmus  V Facial Sensation: Normal. No weakness of masticatory muscles  VII: No facial weakness or asymmetry  VIII Auditory Acuity: Grossly normal  IX/X: The uvula is midline; the palate elevates symmetrically  XI: Normal sternocleidomastoid and trapezius strength  XII: The tongue is midline. No atrophy or fasciculations.   Motor System: Muscle Strength: 5/5 and symmetric in the upper and lower extremities. No pronation or drift.  Muscle Tone: Tone and muscle bulk are normal in the upper and lower extremities.   Reflexes: DTRs: 1+ and symmetrical in all four extremities. Plantar responses are flexor bilaterally.  Coordination: Intact finger-to-nose. No tremor.  Sensation: Intact to light touch, and pinprick. Negative Romberg test.  Gait: deferred    Skin: Skin is warm and dry. No rash noted. He is not diaphoretic. No erythema.  Psychiatric: He has a normal mood and affect.  Vitals reviewed.    ED Treatments / Results  Labs (all labs ordered are listed, but only abnormal results are displayed) Labs Reviewed  COMPREHENSIVE METABOLIC PANEL - Abnormal; Notable for the following:       Result Value   Glucose, Bld 166 (*)    All other components within normal limits  CBG MONITORING, ED - Abnormal; Notable for the following:    Glucose-Capillary 158 (*)    All other components within normal limits  I-STAT CHEM 8, ED - Abnormal; Notable for the following:    Chloride 97  (*)    Glucose, Bld 158 (*)    All other components within normal limits  PROTIME-INR  APTT  CBC  DIFFERENTIAL  I-STAT TROPOININ, ED    EKG  EKG Interpretation  Date/Time:  Saturday December 18 2015 16:17:29 EDT Ventricular Rate:  73 PR Interval:    QRS Duration: 137 QT Interval:  407 QTC Calculation: 449 R Axis:   -78 Text Interpretation:  Sinus rhythm Right bundle branch block Inferior infarct, old Confirmed by Lita Mains  MD, DAVID ([id number]) on 12/18/2015 4:28:32 PM       Radiology Ct Head Code Stroke W/o Cm  Result Date: 12/18/2015 CLINICAL DATA:  Code stroke. Right-sided weakness and numbness beginning today. EXAM: CT HEAD WITHOUT CONTRAST TECHNIQUE: Contiguous axial images were obtained from the base of the skull through the vertex without intravenous contrast. COMPARISON:  None. FINDINGS: There is no evidence of acute cortical infarct, intracranial hemorrhage, mass, midline shift, or extra-axial fluid  collection. Ventricles and sulci are within normal limits for age. 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 Prior bilateral cataract extraction is noted. The visualized paranasal sinuses and mastoid air cells are clear. No acute osseous abnormality is seen. Calcified atherosclerosis is noted at the skullbase. ASPECTS [facility] Stroke Program Early CT Score) - Ganglionic level infarction (caudate, lentiform nuclei, internal capsule, insula, M1-M3 cortex): 7 - Supraganglionic infarction (M4-M6 cortex): 3 Total score (0-10 with 10 being normal): 10 IMPRESSION: 1. No evidence of acute intracranial abnormality. 2. ASPECTS is 10. 3. Extensive chronic small vessel ischemic disease. These results were called by telephone at the time of interpretation on 12/18/2015 at 4:37 pm to Dr. Julianne Rice , who verbally  acknowledged these results. Electronically Signed   By: Logan Bores M.D.   On: 12/18/2015 16:37    Procedures Procedures (including critical care time)  Medications Ordered in ED Medications - No data to display   Initial Impression / Assessment and Plan / ED Course  I have reviewed the triage vital signs and the nursing notes.  Pertinent labs & imaging results that were available during my care of the patient were reviewed by me and considered in my medical decision making (see chart for details).  Clinical Course    Seen at [facility] long prior to transfer for sudden onset of right-sided facial and upper extremity numbness. Code stroke initiated. Case was discussed with neurology who decided to hold TPA at that time given the improving symptoms. Patient was hemodynamically stable. CT head without acute intracranial hemorrhage. Transferred here without complication. Upon arrival symptoms have completely resolved.  Neurology at bedside who feel that the patient still has mild deficit on the right. Still not a candidate for TPA at this time. We'll continue with admission for continued workup.    Final Clinical Impressions(s) / ED Diagnoses   Final diagnoses:  Cerebral infarction due to unspecified mechanism    Disposition: Admit  Condition: stable    Fatima Blank, MD 12/19/15 0021

## 2015-12-18 NOTE — ED Triage Notes (Signed)
He reports sudden onset of paresthsia of right sided of face and also at that time he discovered that he had weakness and incoordination of his right hand ("couldn't pick up a glass".  He now states his right hand strength and function have recovered; but he continues to have some very mild slurring of speech and he states "It is hard to form my words--to say what I am trying to say". I met him at triage and brought him directly to resuc. B.  He also c/o moderate frontal h/a.

## 2015-12-18 NOTE — ED Provider Notes (Signed)
[place] DEPT Provider Note   CSN: [id number] Arrival date & time: 12/18/15  1601     History   Chief Complaint Chief Complaint  Patient presents with  . Code Stroke    HPI Vincent Alvarez is a 75 y.o. male.  HPI Patient last seen normal at 2:30 pm this afternoon. States he was watching television when developed difficulty speaking, right-sided facial numbness, right grip weakness and word finding difficulty. Weakness has improved significantly. Patient still has some very mild right-sided facial weakness. Also having word finding difficulty and wife states his speech is slightly slurred.   Past Medical History:  Diagnosis Date  . Atypical chest pain   . Depression   . Depression   . Diabetes mellitus without complication ([place])   . Gout   . Hearing loss   . Hypertension   . Onychomycosis   . UTI (lower urinary tract infection)     There are no active problems to display for this patient.   Past Surgical History:  Procedure Laterality Date  . COLONOSCOPY         Home Medications    Prior to Admission medications   Medication Sig Start Date End Date Taking? Authorizing Provider  amLODipine (NORVASC) 10 MG tablet Take 10 mg by mouth daily.    Historical Provider, MD  atorvastatin (LIPITOR) 20 MG tablet Take 20 mg by mouth daily.    Historical Provider, MD  FLUoxetine (PROZAC) 20 MG capsule Take 20 mg by mouth daily.    Historical Provider, MD  metFORMIN (GLUCOPHAGE) 500 MG tablet Take by mouth 2 (two) times daily with a meal.    Historical Provider, MD  ramipril (ALTACE) 10 MG capsule Take 10 mg by mouth daily.    Historical Provider, MD  sitaGLIPtin (JANUVIA) 100 MG tablet Take 100 mg by mouth daily.    Historical Provider, MD  tamsulosin (FLOMAX) 0.4 MG CAPS capsule Take 0.4 mg by mouth.    Historical Provider, MD    Family History Family History  Problem Relation Age of Onset  . Family history unknown: Yes    Social History Social History    Substance Use Topics  . Smoking status: Former Smoker    Quit date: 06/19/1995  . Smokeless tobacco: Not on file  . Alcohol use 0.6 oz/week    1 Glasses of wine per week     Allergies   Review of patient's allergies indicates no known allergies.   Review of Systems Review of Systems  Constitutional: Negative for chills and fever.  Eyes: Negative for visual disturbance.  Respiratory: Negative for shortness of breath.   Cardiovascular: Negative for chest pain.  Gastrointestinal: Negative for abdominal pain, nausea and vomiting.  Musculoskeletal: Negative for neck pain.  Skin: Negative for rash and wound.  Neurological: Positive for speech difficulty, weakness and numbness. Negative for light-headedness and headaches.  Psychiatric/Behavioral: Positive for confusion.  All other systems reviewed and are negative.    Physical Exam Updated Vital Signs BP 120/79 (BP Location: Left Arm)   Pulse 74   Temp 98 F (36.7 C) (Oral)   Resp 16   Ht 5\' 8"  (1.727 m)   Wt 223 lb (101.2 kg)   SpO2 98%   BMI 33.91 kg/m   Physical Exam  Constitutional: He is oriented to person, place, and time. He appears well-developed and well-nourished.  HENT:  Head: Normocephalic and atraumatic.  Mouth/Throat: Oropharynx is clear and moist. No oropharyngeal exudate.  Eyes: EOM are normal. Pupils  are equal, round, and reactive to light.  Neck: Normal range of motion. Neck supple.  Cardiovascular: Normal rate and regular rhythm.   Pulmonary/Chest: Effort normal and breath sounds normal.  Abdominal: Soft. Bowel sounds are normal. There is no tenderness. There is no rebound and no guarding.  Musculoskeletal: Normal range of motion. He exhibits no edema or tenderness.  Neurological: He is alert and oriented to person, place, and time.  Patient with slight slurring of words. Mild expressive aphasia. Mild right-sided facial numbness. Equal grip strength bilaterally. No drift in any extremity. Bilateral  finger to nose intact.  Skin: Skin is warm and dry. Capillary refill takes less than 2 seconds. No rash noted. No erythema.  Psychiatric: He has a normal mood and affect. His behavior is normal.  Nursing note and vitals reviewed.    ED Treatments / Results  Labs (all labs ordered are listed, but only abnormal results are displayed) Labs Reviewed  COMPREHENSIVE METABOLIC PANEL - Abnormal; Notable for the following:       Result Value   Glucose, Bld 166 (*)    All other components within normal limits  CBG MONITORING, ED - Abnormal; Notable for the following:    Glucose-Capillary 158 (*)    All other components within normal limits  I-STAT CHEM 8, ED - Abnormal; Notable for the following:    Chloride 97 (*)    Glucose, Bld 158 (*)    All other components within normal limits  PROTIME-INR  APTT  CBC  DIFFERENTIAL  I-STAT TROPOININ, ED    EKG  EKG Interpretation  Date/Time:  Saturday December 18 2015 16:17:29 EDT Ventricular Rate:  73 PR Interval:    QRS Duration: 137 QT Interval:  407 QTC Calculation: 449 R Axis:   -78 Text Interpretation:  Sinus rhythm Right bundle branch block Inferior infarct, old Confirmed by Lita Mains  MD, Tiandra Swoveland ([id number]) on 12/18/2015 4:28:32 PM       Radiology Ct Head Code Stroke W/o Cm  Result Date: 12/18/2015 CLINICAL DATA:  Code stroke. Right-sided weakness and numbness beginning today. EXAM: CT HEAD WITHOUT CONTRAST TECHNIQUE: Contiguous axial images were obtained from the base of the skull through the vertex without intravenous contrast. COMPARISON:  None. FINDINGS: There is no evidence of acute cortical infarct, intracranial hemorrhage, mass, midline shift, or extra-axial fluid collection. Ventricles and sulci are within normal limits for age. 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 Prior bilateral cataract extraction is noted. The visualized paranasal sinuses and mastoid air cells are clear. No acute osseous abnormality is seen. Calcified atherosclerosis is noted at the skullbase. ASPECTS [facility] Stroke Program Early CT Score) - Ganglionic level infarction (caudate, lentiform nuclei, internal capsule, insula, M1-M3 cortex): 7 - Supraganglionic infarction (M4-M6 cortex): 3 Total score (0-10 with 10 being normal): 10 IMPRESSION: 1. No evidence of acute intracranial abnormality. 2. ASPECTS is 10. 3. Extensive chronic small vessel ischemic disease. These results were called by telephone at the time of interpretation on 12/18/2015 at 4:37 pm to Dr. Julianne Rice , who verbally acknowledged these results. Electronically Signed   By: Logan Bores M.D.   On: 12/18/2015 16:37   CRITICAL CARE Performed by: Lita Mains, Anitra Doxtater Total critical care time: 30 minutes Critical care time was exclusive of separately billable procedures and treating other patients. Critical care was necessary to treat or prevent  imminent or life-threatening deterioration. Critical care was time spent personally by me on the following activities: development of treatment plan with patient and/or surrogate as well as nursing, discussions with consultants, evaluation of patient's response to treatment, examination of patient, obtaining history from patient or surrogate, ordering and performing treatments and interventions, ordering and review of laboratory studies, ordering and review of radiographic studies, pulse oximetry and re-evaluation of patient's condition. Procedures Procedures (including critical care time)  Medications Ordered in ED Medications - No data to display   Initial Impression / Assessment and Plan / ED Course  I have reviewed the triage vital signs and the nursing notes.  Pertinent labs & imaging results that were available during my care of the patient were  reviewed by me and considered in my medical decision making (see chart for details).  Clinical Course   Code stroke called after initial evaluation for mild ongoing speech deficits. Discussed with Dr. Shon Hale. Given the fact the patient is only having very intermittent word finding ability and no definite slurred speech inject with the wife states his speech is different than normal, decision made to withhold TPA. Discussed this with the patient and he agrees. Dr. Shon Hale. Patient in [facility] emergency [facility] at [facility]. Discussed with Dr. Wyvonnia Dusky will accept the patient in transfer. CT head performed and shows no acute abnormality. Vital signs remained stable.  Final Clinical Impressions(s) / ED Diagnoses   Final diagnoses:  Cerebral infarction due to unspecified mechanism    New Prescriptions New Prescriptions   No medications on file     Julianne Rice, MD 12/18/15 1710

## 2015-12-18 NOTE — ED Notes (Signed)
Pt came in c/o of right sided tingling of the face that progressed to right sided weakness and slurred speech. Symptoms have resolved as of now. Pt A+O x4 and passed his swallow study. Tiny amount of right sided weakness but pt could not tell

## 2015-12-18 NOTE — ED Notes (Signed)
He remains awake, alert and in no distress and is transferred to [facility] via [facility] at this time.

## 2015-12-18 NOTE — ED Notes (Signed)
My efforts thus far have been to establish IV therapy and draw and send labs, which were obtained before he went to CT. As I write this, he has just returned from CT.  He remains awake, alert and in no distress. He is able to tell me immediately and correctly without detectable slurring his height and weight.

## 2015-12-18 NOTE — Consult Note (Signed)
Neurology Consultation Reason for Consult: Right-sided weakness Referring Physician: Cardama, P  CC: Right-sided weakness  History is obtained from: Patient  HPI: Vincent Alvarez is a 75 y.o. male who complained of right facial and arm numbness started around 2:30 PM. He states that he was dropping things repeatedly from his right arm. He then noticed significant improvement after coming to the hospital. Initially, Code stroke was considered, but given the improvement in the mild symptoms, it was decided that TPA was not indicated.   LKW: 2:30 PM tpa given?: no, mild symptoms, rapidly improving symptoms    ROS: A 14 point ROS was performed and is negative except as noted in the HPI.   Past Medical History:  Diagnosis Date  . Atypical chest pain   . Depression   . Depression   . Diabetes mellitus without complication ([place])   . Gout   . Hearing loss   . Hypertension   . Onychomycosis   . UTI (lower urinary tract infection)      Family History  Problem Relation Age of Onset  . Family history unknown: Yes     Social History:  reports that he quit smoking about 20 years ago. He does not have any smokeless tobacco history on file. He reports that he drinks about 0.6 oz of alcohol per week . He reports that he does not use drugs.   Exam: Current vital signs: BP 125/84   Pulse 69   Temp 98 F (36.7 C) (Oral)   Resp 12   Ht 5\' 8"  (1.727 m)   Wt 101.2 kg (223 lb)   SpO2 94%   BMI 33.91 kg/m  Vital signs in last 24 hours: Temp:  [98 F (36.7 C)] 98 F (36.7 C) (09/02 1606) Pulse Rate:  [64-82] 69 (09/02 1915) Resp:  [12-19] 12 (09/02 1915) BP: (120-159)/(66-84) 125/84 (09/02 1915) SpO2:  [94 %-100 %] 94 % (09/02 1915) Weight:  [101.2 kg (223 lb)] 101.2 kg (223 lb) (09/02 1628)   Physical Exam  Constitutional: Appears well-developed and well-nourished.  Psych: Affect appropriate to situation Eyes: No scleral injection HENT: No OP obstrucion Head:  Normocephalic.  Cardiovascular: Normal rate and regular rhythm.  Respiratory: Effort normal and breath sounds normal to anterior ascultation GI: Soft.  No distension. There is no tenderness.  Skin: WDI  Neuro: Mental Status: Patient is awake, alert, oriented to person, place, month, year, and situation. Patient is able to give a clear and coherent history. No signs of aphasia or neglect Cranial Nerves: II: Visual Fields are full. Pupils are equal, round, and reactive to light.   III,IV, VI: EOMI without ptosis or diploplia.  V: Facial sensation is symmetric to temperature VII: Facial movement is very slightly weak on the right VIII: hearing is intact to voice X: Uvula elevates symmetrically XI: Shoulder shrug is symmetric. XII: tongue is midline without atrophy or fasciculations.  Motor: Tone is normal. Bulk is normal. 5/5 strength was present in the left arm and leg, 4+/5 weakness to confrontation in the right arm and leg without drift. Sensory: Sensation is symmetric to light touch and temperature in the arms and legs. Cerebellar: FNF and HKS are intact on the right, on the left he has mild horizontal tremor on finger nose finger of the left arm as well as difficulty with rapid repetitive movements in the left arm. This is very mild and there is no passpointing.   I have reviewed labs in epic and the results pertinent to  this consultation are: CMP-unremarkable  I have reviewed the images obtained:ct head - no acute findings  Impression: 75 yo M with mild right sided weakness. I suspect that he has had a small ischemic stroke and he will need to be admitted for further workup. With improvement, TIA is possible, but I suspect that there is some infarct given persistent symptoms at this time.   Recommendations: 1. HgbA1c, fasting lipid panel 2. MRI, MRA  of the brain without contrast 3. Frequent neuro checks 4. Echocardiogram 5. Carotid dopplers 6. Prophylactic  therapy-Antiplatelet med: Aspirin - dose 325mg  PO or 300mg  PR 7. Risk factor modification 8. Telemetry monitoring 9. PT consult, OT consult, Speech consult 10. please page stroke NP  Or  PA  Or MD  from 8am -4 pm starting 9/3 as this patient will be followed by the stroke team at this point.   You can look them up on [email or web address]     Roland Rack, MD [facility] [phone]  If 7pm- 7am, please page neurology on call as listed in [place].

## 2015-12-18 NOTE — ED Notes (Signed)
Patient has mild numbness in right arm at present time, states has significantly resolved since episode starting at 1430 today. Pt sts had mild frontal headache and dizziness that has resolved at this time. Patient speech is clear and patient is able to communicate without difficulty.

## 2015-12-18 NOTE — ED Notes (Signed)
Neuro MD at bedside

## 2015-12-18 NOTE — ED Notes (Signed)
I have just phoned report to [facility], who are en route. Dr. Lita Mains has just spoken with pt. In the presence of his wife and informed them pt. Will be imminently transferred to [facility] and that since his neuro symptoms are improving we do NOT intend to administer tpa, this plan with which they agree.

## 2015-12-19 ENCOUNTER — Observation Stay (HOSPITAL_BASED_OUTPATIENT_CLINIC_OR_DEPARTMENT_OTHER): Payer: Medicare Other

## 2015-12-19 ENCOUNTER — Observation Stay (HOSPITAL_COMMUNITY): Payer: Medicare Other

## 2015-12-19 DIAGNOSIS — I639 Cerebral infarction, unspecified: Secondary | ICD-10-CM

## 2015-12-19 DIAGNOSIS — R200 Anesthesia of skin: Secondary | ICD-10-CM | POA: Diagnosis not present

## 2015-12-19 DIAGNOSIS — I10 Essential (primary) hypertension: Secondary | ICD-10-CM | POA: Diagnosis not present

## 2015-12-19 DIAGNOSIS — E785 Hyperlipidemia, unspecified: Secondary | ICD-10-CM | POA: Diagnosis not present

## 2015-12-19 DIAGNOSIS — E11319 Type 2 diabetes mellitus with unspecified diabetic retinopathy without macular edema: Secondary | ICD-10-CM | POA: Diagnosis not present

## 2015-12-19 DIAGNOSIS — G459 Transient cerebral ischemic attack, unspecified: Secondary | ICD-10-CM | POA: Diagnosis not present

## 2015-12-19 LAB — LIPID PANEL
CHOL/HDL RATIO: 2.5 ratio
Cholesterol: 114 mg/dL (ref 0–200)
HDL: 46 mg/dL (ref >40)
LDL Cholesterol: 40 mg/dL (ref 0–99)
Triglycerides: 141 mg/dL (ref <150)
VLDL: 28 mg/dL (ref 0–40)

## 2015-12-19 LAB — GLUCOSE, CAPILLARY
GLUCOSE-CAPILLARY: 140 mg/dL — AB (ref 65–99)
Glucose-Capillary: 188 mg/dL — ABNORMAL HIGH (ref 65–99)
Glucose-Capillary: 209 mg/dL — ABNORMAL HIGH (ref 65–99)
Glucose-Capillary: 196 mg/dL — ABNORMAL HIGH (ref 65–99)

## 2015-12-19 LAB — RAPID URINE DRUG SCREEN, HOSP PERFORMED
Amphetamines: NOT DETECTED
BARBITURATES: NOT DETECTED
Benzodiazepines: NOT DETECTED
COCAINE: NOT DETECTED
Opiates: NOT DETECTED
TETRAHYDROCANNABINOL: NOT DETECTED

## 2015-12-19 LAB — HEMOGLOBIN A1C
HEMOGLOBIN A1C: 7.3 % — AB (ref 4.8–5.6)
MEAN PLASMA GLUCOSE: 163 mg/dL

## 2015-12-19 NOTE — Evaluation (Signed)
Physical Therapy Evaluation Patient Details Name: Vincent Alvarez MRN: [id number] DOB: Jun 30, 1940 Today's Date: 12/19/2015   History of Present Illness  75 y.o. male admitted for difficulty speaking, R sided facial numbness, R grip weakness and numbness. MRI (-) for acute infarct. PMH significant for HTN, DM type II, atypical chest pain, gout, hearing loss, and depression.  Clinical Impression  Pt functioning near baseline but demo's R sided weakness and impaired balance/increased falls risk. Recommended pt use cane to improve safety with ambulation. Will trial next session. Acute PT to follow. Recommend outpt PT to improve balance/decrease falls risk.    Follow Up Recommendations Outpatient PT;Supervision - Intermittent    Equipment Recommendations       Recommendations for Other Services       Precautions / Restrictions Precautions Precautions: Fall Restrictions Weight Bearing Restrictions: No      Mobility  Bed Mobility Overal bed mobility: Modified Independent             General bed mobility comments: use of bed rail  Transfers Overall transfer level: Needs assistance Equipment used: None Transfers: Sit to/from Stand Sit to Stand: Min guard            Ambulation/Gait Ambulation/Gait assistance: Min guard Ambulation Distance (Feet): 200 Feet Assistive device: None Gait Pattern/deviations: Step-through pattern   Gait velocity interpretation: at or above normal speed for age/gender General Gait Details: no overt episodes of LOB  Stairs Stairs: Yes Stairs assistance: Supervision Stair Management: One rail Right;Alternating pattern Number of Stairs: 2 General stair comments: use of R hand rail to mimic home set up  Wheelchair Mobility    Modified Rankin (Stroke Patients Only)       Balance Overall balance assessment: Needs assistance               Single Leg Stance - Right Leg:  (unable without external support) Single Leg Stance -  Left Leg:  (unable without external support) Tandem Stance - Right Leg:  (pt with immeadiate LOB, unable to maintain) Tandem Stance - Left Leg:  (immeadiate LOB, unable to maintain without external support)         Standardized Balance Assessment Standardized Balance Assessment : Dynamic Gait Index   Dynamic Gait Index Level Surface: Normal Change in Gait Speed: Mild Impairment Gait with Horizontal Head Turns: Mild Impairment Gait with Vertical Head Turns: Mild Impairment Gait and Pivot Turn: Mild Impairment Step Over Obstacle: Mild Impairment Step Around Obstacles: Mild Impairment Steps: Mild Impairment Total Score: 17       Pertinent Vitals/Pain Pain Assessment: No/denies pain    Home Living Family/patient expects to be discharged to:: Private residence Living Arrangements: Spouse/significant other Available Help at Discharge: Family;Available PRN/intermittently Type of Home: House Home Access: Stairs to enter Entrance Stairs-Rails: Right Entrance Stairs-Number of Steps: 2 Home Layout: One level Home Equipment: Walker - 2 wheels;Cane - single point      Prior Function Level of Independence: Independent with assistive device(s)         Comments: Occasionally uses [employer] for community mobility, drives, used to be a Dietitian in the [place]: Right    Extremity/Trunk Assessment   Upper Extremity Assessment: Defer to OT evaluation           Lower Extremity Assessment: RLE deficits/detail RLE Deficits / Details: mild weakness in RLE muscle groups, overall 4+/5; AROM wfl; no numbness/tingling reported    Cervical / Trunk Assessment: Normal  Communication   Communication: No difficulties  Cognition Arousal/Alertness: Awake/alert Behavior During Therapy: WFL for tasks assessed/performed Overall Cognitive Status: Within Functional Limits for tasks assessed                      General Comments       Exercises        Assessment/Plan    PT Assessment Patient needs continued PT services  PT Diagnosis Difficulty walking;Generalized weakness   PT Problem List Decreased strength;Decreased activity tolerance;Decreased balance;Decreased mobility  PT Treatment Interventions DME instruction;Gait training;Stair training;Functional mobility training;Therapeutic activities;Therapeutic exercise;Balance training;Neuromuscular re-education   PT Goals (Current goals can be found in the Care Plan section) Acute Rehab PT Goals PT Goal Formulation: With patient/family Time For Goal Achievement: 12/26/15 Potential to Achieve Goals: Good Additional Goals Additional Goal #1: Pt to score >19 on DGI to indicate minimal falls risk.    Frequency Min 3X/week   Barriers to discharge Decreased caregiver support home alone during the day    Co-evaluation               End of Session Equipment Utilized During Treatment: Gait belt Activity Tolerance: Patient tolerated treatment well Patient left: in chair;with call bell/phone within reach;with chair alarm set Nurse Communication: Mobility status    Functional Assessment Tool Used: clinical jdugement Functional Limitation: Mobility: Walking and moving around Mobility: Walking and Moving Around Current Status [id number]): At least 1 percent but less than 20 percent impaired, limited or restricted Mobility: Walking and Moving Around Goal Status [phone]): At least 1 percent but less than 20 percent impaired, limited or restricted    Time: 1432-1449 PT Time Calculation (min) (ACUTE ONLY): 17 min   Charges:   PT Evaluation $PT Eval Moderate Complexity: 1 Procedure     PT G Codes:   PT G-Codes **NOT FOR INPATIENT CLASS** Functional Assessment Tool Used: clinical jdugement Functional Limitation: Mobility: Walking and moving around Mobility: Walking and Moving Around Current Status [id number]): At least 1 percent but less than 20 percent impaired,  limited or restricted Mobility: Walking and Moving Around Goal Status [phone]): At least 1 percent but less than 20 percent impaired, limited or restricted    Vincent Alvarez 12/19/2015, 3:01 PM   Kittie Plater, PT, DPT Pager #: [phone] Office #: [phone]

## 2015-12-19 NOTE — Progress Notes (Signed)
STROKE TEAM PROGRESS NOTE   HISTORY OF PRESENT ILLNESS (per record) Vincent Alvarez is a 75 y.o. male who complained of right facial and arm numbness started around 2:30 PM. He states that he was dropping things repeatedly from his right arm. He then noticed significant improvement after coming to the hospital. Initially, Code stroke was considered, but given the improvement in the mild symptoms, it was decided that TPA was not indicated.   LKW: 2:30 PM tpa given?: no, mild symptoms, rapidly improving symptoms   SUBJECTIVE (INTERVAL HISTORY) His wife was at the bedside and able to describe the event that lasted approximately 2 hours.   is at the bedside.  Overall he feels his condition is completely resolved. He was off the floor for diagnostic studies. Examined upon his return.  ROS positive for left upper quadrant pain that lasted a few minutes, 9/10 and felt like a cramp.  No longer present.  He did have this pain yesterday and it radiated to chest then.   OBJECTIVE Temp:  [98 F (36.7 C)-98.6 F (37 C)] 98.3 F (36.8 C) (09/03 0625) Pulse Rate:  [64-82] 67 (09/03 0625) Cardiac Rhythm: Normal sinus rhythm (09/02 2024) Resp:  [12-19] 18 (09/03 0625) BP: (120-159)/(66-84) 141/70 (09/03 0625) SpO2:  [94 %-100 %] 95 % (09/03 0625) Weight:  [101.2 kg (223 lb)-104.1 kg (229 lb 6.4 oz)] 104.1 kg (229 lb 6.4 oz) (09/02 2026)  CBC:  Recent Labs Lab 12/18/15 1615 12/18/15 1628  WBC 7.4  --   NEUTROABS 4.4  --   HGB 14.2 14.6  HCT 41.0 43.0  MCV 87.8  --   PLT 209  --     Basic Metabolic Panel:  Recent Labs Lab 12/18/15 1615 12/18/15 1628  NA 135 136  K 4.1 4.1  CL 101 97*  CO2 26  --   GLUCOSE 166* 158*  BUN 19 18  CREATININE 0.92 1.00  CALCIUM 9.9  --     Lipid Panel:    Component Value Date/Time   CHOL 114 12/19/2015 0353   TRIG 141 12/19/2015 0353   HDL 46 12/19/2015 0353   CHOLHDL 2.5 12/19/2015 0353   VLDL 28 12/19/2015 0353   LDLCALC 40 12/19/2015 0353    HgbA1c: No results found for: HGBA1C Urine Drug Screen:    Component Value Date/Time   LABOPIA NONE DETECTED 12/19/2015 0300   COCAINSCRNUR NONE DETECTED 12/19/2015 0300   LABBENZ NONE DETECTED 12/19/2015 0300   AMPHETMU NONE DETECTED 12/19/2015 0300   THCU NONE DETECTED 12/19/2015 0300   LABBARB NONE DETECTED 12/19/2015 0300      IMAGING  Mr Jodene Nam Head/brain Wo Cm 12/19/2015  MRI HEAD  1. No acute intracranial infarct or other process identified.  2. 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   MRA HEAD  Normal intracranial MRA.   Ct Head Code Stroke W/o Cm 12/18/2015 1. No evidence of acute intracranial abnormality.  2. ASPECTS is 10.  3. Extensive chronic small vessel ischemic disease.     PHYSICAL EXAM Constitutional: Appears well-developed and well-nourished.  Psych: Affect appropriate to situation Eyes: No scleral injection HENT: No OP obstrucion Head: Normocephalic.  Cardiovascular: Normal rate and regular rhythm.  Respiratory: Effort normal and breath sounds normal to anterior ascultation GI: Soft.  No distension. There is no tenderness.  Skin: WDI  Neuro: Mental Status: Patient is awake, alert, oriented to person, place, month, year, and situation. Patient is able to give a clear and coherent history. Patient  did have a few moments when he had difficulty thinking of a word.  He states this is much better than yesterday when he had trouble finding words and reading words Cranial Nerves: II: Visual Fields are full. Pupils are equal, round, and reactive to light.   III,IV, VI: EOMI without ptosis or diploplia.  V: Facial sensation is symmetric to temperature VII: Facial movement is very slightly weak on the right VIII: hearing is intact to voice; does wear hearing devices X: Uvula elevates symmetrically XI: Shoulder shrug is symmetric. XII: tongue is midline without atrophy or fasciculations.  Motor: Tone is normal. Bulk is  normal. 5/5 strength was present in the left arm and leg, 4+/5 weakness to confrontation in the right arm and leg without drift. Sensory: Sensation is symmetric to light touch and temperature in the arms and legs. Cerebellar: FNF and HKS are intact on the right, on the left he has mild horizontal tremor on finger nose finger of the left arm as well as difficulty with rapid repetitive movements in the left arm. This is very mild and there is no passpointing.   ASSESSMENT/PLAN Vincent Alvarez is a 75 y.o. male with history of hypertension, hyperlipidemia, hearing loss, gout, diabetes mellitus, and depression  presenting with. right facial and arm numbness as well as dropping things from his right upper extremity. He did not receive IV t-PA due to mild deficits.  Possible TIA:  Dominant   Resultant  Right-sided weakness; word finding trouble  MRI  No acute intracranial infarct or other process identified.  MRA  negative  Carotid Doppler - pending  2D Echo - pending  LDL 40  HgbA1c pending  VTE prophylaxis - Lovenox  Diet heart healthy/carb modified Room service appropriate? Yes; Fluid consistency: Thin  No antithrombotic prior to admission, now on aspirin 325 mg daily  Patient counseled to be compliant with his antithrombotic medications  Ongoing aggressive stroke risk factor management  Therapy recommendations:  pending   Disposition: Pending  Hypertension  Stable  Permissive hypertension (OK if < 220/120) but gradually normalize in 5-7 days  Long-term BP goal normotensive  Hyperlipidemia  Home meds:  Lipitor 20 mg daily resumed in hospital  LDL 40, goal < 70  Continue statin at discharge  Diabetes  HgbA1c pending, goal < 7.0  Uncontrolled  Other Stroke Risk Factors  Advanced age  Cigarette smoker - quit 1987  ETOH use - 2 glasses of wine per week  Obesity, Body mass index is 34.88 kg/m., recommend weight loss, diet and exercise as appropriate    Other Active Problems    Hospital day # 0  ATTENDING NOTE: Patient was seen and examined by me personally. Documentation reflects findings. The laboratory and radiographic studies reviewed by me. ROS completed by me personally and pertinent positives fully documented  Condition: stable  Assessment and plan completed by me personally and fully documented above. Plans/Recommendations include:     Completing work-up  Will follow  SIGNED BY: Dr. Elissa Hefty     To contact Stroke Continuity provider, please refer to [email or web address] After hours, contact General Neurology

## 2015-12-19 NOTE — Progress Notes (Addendum)
PROGRESS NOTE    Vincent Alvarez  [id number]  DOB: Mar 01, 1941  DOA: 12/18/2015 PCP: No primary care provider on file. Outpatient Specialists:   Hospital course: Vincent Alvarez is a 75 y.o. male with medical history significant of DM with retinopathy, depression, HTN, HLD presenting with possible CVA.  Patient reports that he was watching a ball game about 1430 and noticed R-sided facial numbness.  Half of his tongue and gums on right felt like he had a shot of novicaine.  Then his arm started going numb, he couldn't hold a glass of tea without it turning over.   Also with difficulty word finding, expressive aphasia and some slurring and right facial droop noticed by wife.  Went to [facility] and was transferred to [facility]  No h/o prior.  He did have a concussion from a fall 10-12 years ago.  Only symptom now is a frontal headache which started after all of the other symptoms.  Assessment & Plan:   TIA -Symptoms initially concerning for CVA, although symptoms did resolve quickly  -Will admit to observation status for CVA/TIA evaluation -Telemetry monitoring -MRI/MRA negative for acute findings, no CVA seen.  -Carotid dopplers pending -Echo pending -Risk stratification with FLP, A1c -ASA daily for antiplatelet therapy -Await stroke team recommendations.  HTN -currently holding CCB, alpha blocker, and ACE; plan to restart in 48-72 hours  HLD -Continue Lipitor -lipids optimally controlled, LDL 40  DM -A1c pending -hold Glucophage and Januvia -Cover with SSI  CBG (last 3)   Recent Labs  12/18/15 1615 12/18/15 2131 12/19/15 0742  GLUCAP 158* 149* 188*   Depression -Continue Prozac  DVT prophylaxis:  Lovenox  Code Status: DNR - confirmed with patient Family Communication: None present Disposition Plan: TBD Consults called: Neurology (by ER)  Admission status: Observation to telemetry   Procedures:  MRI/MRA   Subjective: Pt without complaints today,  headache resolved.  Objective: Vitals:   12/19/15 0025 12/19/15 0225 12/19/15 0425 12/19/15 0625  BP: 134/71 135/71 139/74 (!) 141/70  Pulse: 70 66 64 67  Resp: 16 16 16 18   Temp: 98.1 F (36.7 C) 98.1 F (36.7 C) 98.3 F (36.8 C) 98.3 F (36.8 C)  TempSrc: Oral Oral Oral Oral  SpO2: 97% 96% 96% 95%  Weight:      Height:        Intake/Output Summary (Last 24 hours) at 12/19/15 0923 Last data filed at 12/19/15 0325  Gross per 24 hour  Intake              240 ml  Output              490 ml  Net             -250 ml   Filed Weights   12/18/15 1628 12/18/15 2026  Weight: 101.2 kg (223 lb) 104.1 kg (229 lb 6.4 oz)    Exam:   General: Appears calm and comfortable and is NAD; very pleasant and conversant  Eyes:  PERRL, EOMI, normal lids, iris  ENT:  grossly normal hearing, lips & tongue, mmm  Neck:  no LAD, masses or thyromegaly  Cardiovascular:  RRR, no m/r/g. No LE edema.   Respiratory:  CTA bilaterally, no w/r/r. Normal respiratory effort.  Abdomen:  soft, ntnd, NABS  Skin:  no rash or induration seen on limited exam  Musculoskeletal:  grossly normal tone BUE/BLE, good ROM, no bony abnormality  Psychiatric:  grossly normal mood and affect, speech fluent and appropriate, AOx3  Neurologic:  CN 2-12 grossly intact, moves all extremities in coordinated fashion, sensation intact  Data Reviewed: Basic Metabolic Panel:  Recent Labs Lab 12/18/15 1615 12/18/15 1628  NA 135 136  K 4.1 4.1  CL 101 97*  CO2 26  --   GLUCOSE 166* 158*  BUN 19 18  CREATININE 0.92 1.00  CALCIUM 9.9  --    Liver Function Tests:  Recent Labs Lab 12/18/15 1615  AST 20  ALT 25  ALKPHOS 68  BILITOT 0.7  PROT 7.3  ALBUMIN 4.4   No results for input(s): LIPASE, AMYLASE in the last 168 hours. No results for input(s): AMMONIA in the last 168 hours. CBC:  Recent Labs Lab 12/18/15 1615 12/18/15 1628  WBC 7.4  --   NEUTROABS 4.4  --   HGB 14.2 14.6  HCT 41.0 43.0  MCV  87.8  --   PLT 209  --    Cardiac Enzymes: No results for input(s): CKTOTAL, CKMB, CKMBINDEX, TROPONINI in the last 168 hours. CBG (last 3)   Recent Labs  12/18/15 1615 12/18/15 2131 12/19/15 0742  GLUCAP 158* 149* 188*   Recent Results (from the past 240 hour(s))  MRSA PCR Screening     Status: None   Collection Time: 12/18/15  8:49 PM  Result Value Ref Range Status   MRSA by PCR NEGATIVE NEGATIVE Final    Comment:        The GeneXpert MRSA Assay (FDA approved for NASAL specimens only), is one component of a comprehensive MRSA colonization surveillance program. It is not intended to diagnose MRSA infection nor to guide or monitor treatment for MRSA infections.      Studies: Mr Brain Wo Contrast  Result Date: 12/19/2015 CLINICAL DATA:  Initial evaluation for acute right facial and arm numbness. Now improved. EXAM: MRI HEAD WITHOUT CONTRAST MRA HEAD WITHOUT CONTRAST TECHNIQUE: Multiplanar, multiecho pulse sequences of the brain and surrounding structures were obtained without intravenous contrast. Angiographic images of the head were obtained using MRA technique without contrast. COMPARISON:  Prior CT from 12/19/2015. FINDINGS: MRI HEAD FINDINGS Diffuse prominence of the CSF containing spaces is compatible with generalized cerebral atrophy, moderate nature. Patchy and confluent T2/FLAIR hyperintensity within the periventricular and deep white matter both cerebral hemispheres most consistent with chronic small vessel ischemic disease, also moderate nature. Probable small remote lacunar infarct noted within the left thalamus. No abnormal foci of restricted diffusion to suggest acute or subacute infarct. Gray-white matter differentiation maintained. Major intracranial vascular flow voids are preserved. No acute or chronic intracranial hemorrhage. No mass lesion, midline shift, or mass effect. No hydrocephalus. No extra-axial fluid collection. Major dural sinuses are grossly patent.  Craniocervical junction within normal limits. Mild degenerate spondylolysis noted at C3-4 without significant stenosis. Remainder the visualized upper cervical spine unremarkable. Pituitary gland within normal limits. No acute abnormality about the globes and orbits. Patient is status post lens extraction bilaterally. Mild mucosal thickening throughout the paranasal sinuses. No air-fluid level to suggest active sinus infection. Trace opacity within the right mastoid air cells. Inner ear structures grossly normal. Bone marrow signal intensity within normal limits. No scalp soft tissue abnormality. MRA HEAD FINDINGS ANTERIOR CIRCULATION: Distal cervical segments of the internal carotid arteries are patent with antegrade flow. Petrous, cavernous, and supraclinoid segments widely patent without significant stenosis. Left A1 segment dominant and widely patent. Right A1 segment hypoplastic, all likely accounts for the slightly diminutive right ICA as compared the left. Anterior communicating artery normal. Anterior cerebral arteries well  opacified. M1 segments patent without stenosis or occlusion. MCA bifurcations normal. Distal MCA branches well opacified and symmetric. POSTERIOR CIRCULATION: Right vertebral artery dominant and widely patent to the vertebrobasilar junction. Left vertebral artery diffusely hypoplastic but patent as well. Basilar artery mildly tortuous but widely patent to its distal aspect. Superior cerebral arteries patent bilaterally. Both of the posterior cerebral arteries arise from the basilar artery are well opacified to their distal aspects. Small left posterior communicating artery noted. No aneurysm or vascular malformation. IMPRESSION: MRI HEAD IMPRESSION: 1. No acute intracranial infarct or other process identified. 2. 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 MRA HEAD IMPRESSION: Normal intracranial MRA. Electronically Signed   By: Jeannine Boga M.D.   On:  12/19/2015 06:45   Mr Jodene Nam Head/brain [id number] Cm  Result Date: 12/19/2015 CLINICAL DATA:  Initial evaluation for acute right facial and arm numbness. Now improved. EXAM: MRI HEAD WITHOUT CONTRAST MRA HEAD WITHOUT CONTRAST TECHNIQUE: Multiplanar, multiecho pulse sequences of the brain and surrounding structures were obtained without intravenous contrast. Angiographic images of the head were obtained using MRA technique without contrast. COMPARISON:  Prior CT from 12/19/2015. FINDINGS: MRI HEAD FINDINGS Diffuse prominence of the CSF containing spaces is compatible with generalized cerebral atrophy, moderate nature. Patchy and confluent T2/FLAIR hyperintensity within the periventricular and deep white matter both cerebral hemispheres most consistent with chronic small vessel ischemic disease, also moderate nature. Probable small remote lacunar infarct noted within the left thalamus. No abnormal foci of restricted diffusion to suggest acute or subacute infarct. Gray-white matter differentiation maintained. Major intracranial vascular flow voids are preserved. No acute or chronic intracranial hemorrhage. No mass lesion, midline shift, or mass effect. No hydrocephalus. No extra-axial fluid collection. Major dural sinuses are grossly patent. Craniocervical junction within normal limits. Mild degenerate spondylolysis noted at C3-4 without significant stenosis. Remainder the visualized upper cervical spine unremarkable. Pituitary gland within normal limits. No acute abnormality about the globes and orbits. Patient is status post lens extraction bilaterally. Mild mucosal thickening throughout the paranasal sinuses. No air-fluid level to suggest active sinus infection. Trace opacity within the right mastoid air cells. Inner ear structures grossly normal. Bone marrow signal intensity within normal limits. No scalp soft tissue abnormality. MRA HEAD FINDINGS ANTERIOR CIRCULATION: Distal cervical segments of the internal carotid  arteries are patent with antegrade flow. Petrous, cavernous, and supraclinoid segments widely patent without significant stenosis. Left A1 segment dominant and widely patent. Right A1 segment hypoplastic, all likely accounts for the slightly diminutive right ICA as compared the left. Anterior communicating artery normal. Anterior cerebral arteries well opacified. M1 segments patent without stenosis or occlusion. MCA bifurcations normal. Distal MCA branches well opacified and symmetric. POSTERIOR CIRCULATION: Right vertebral artery dominant and widely patent to the vertebrobasilar junction. Left vertebral artery diffusely hypoplastic but patent as well. Basilar artery mildly tortuous but widely patent to its distal aspect. Superior cerebral arteries patent bilaterally. Both of the posterior cerebral arteries arise from the basilar artery are well opacified to their distal aspects. Small left posterior communicating artery noted. No aneurysm or vascular malformation. IMPRESSION: MRI HEAD IMPRESSION: 1. No acute intracranial infarct or other process identified. 2. 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 MRA HEAD IMPRESSION: Normal intracranial MRA. Electronically Signed   By: Jeannine Boga M.D.   On: 12/19/2015 06:45   Ct Head Code Stroke W/o Cm  Result Date: 12/18/2015 CLINICAL DATA:  Code stroke. Right-sided weakness and numbness beginning today. EXAM: CT HEAD WITHOUT CONTRAST TECHNIQUE: Contiguous axial  images were obtained from the base of the skull through the vertex without intravenous contrast. COMPARISON:  None. FINDINGS: There is no evidence of acute cortical infarct, intracranial hemorrhage, mass, midline shift, or extra-axial fluid collection. Ventricles and sulci are within normal limits for age. 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 Chronic lacunar  infarcts are noted in the left putamen and external capsule. Prior bilateral cataract extraction is noted. The visualized paranasal sinuses and mastoid air cells are clear. No acute osseous abnormality is seen. Calcified atherosclerosis is noted at the skullbase. ASPECTS [facility] Stroke Program Early CT Score) - Ganglionic level infarction (caudate, lentiform nuclei, internal capsule, insula, M1-M3 cortex): 7 - Supraganglionic infarction (M4-M6 cortex): 3 Total score (0-10 with 10 being normal): 10 IMPRESSION: 1. No evidence of acute intracranial abnormality. 2. ASPECTS is 10. 3. Extensive chronic small vessel ischemic disease. These results were called by telephone at the time of interpretation on 12/18/2015 at 4:37 pm to Dr. Julianne Rice , who verbally acknowledged these results. Electronically Signed   By: Logan Bores M.D.   On: 12/18/2015 16:37     Scheduled Meds: . aspirin  300 mg Rectal Daily   Or  . aspirin  325 mg Oral Daily  . atorvastatin  20 mg Oral Daily  . enoxaparin (LOVENOX) injection  40 mg Subcutaneous Q24H  . FLUoxetine  20 mg Oral Daily  . insulin aspart  0-15 Units Subcutaneous TID WC   Continuous Infusions: . 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

## 2015-12-19 NOTE — Progress Notes (Signed)
VASCULAR LAB PRELIMINARY  PRELIMINARY  PRELIMINARY  PRELIMINARY  Carotid duplex completed.    Preliminary report:  Bilateral:  1-39% ICA stenosis.  Vertebral artery flow is antegrade.     [place], RVS 12/19/2015, 1:04 PM

## 2015-12-19 NOTE — Evaluation (Addendum)
Occupational Therapy Evaluation Patient Details Name: Vincent Alvarez MRN: [id number] DOB: 22-Oct-1940 Today's Date: 12/19/2015    History of Present Illness 75 y.o. male admitted for difficulty speaking, R sided facial numbness, R grip weakness and numbness. MRI (-) for acute infarct. PMH significant for HTN, DM type II, atypical chest pain, gout, hearing loss, and depression.   Clinical Impression   PTA, pt was independent with all ADLs and occasionally used [facility] for community mobility. Pt currently presents with mild R side weakness, impulsivity, and decreased safety awareness and required supervision for all ADLs and transfers. Pt plans to d/c home with intermittent assistance from his wife. Pt will benefit from continued acute OT to increase independence and safety with ADLs and mobility to allow for safe discharge home. No OT follow up or DME recommended at this time.    Follow Up Recommendations  No OT follow up;Supervision - Intermittent    Equipment Recommendations  None recommended by OT    Recommendations for Other Services       Precautions / Restrictions Precautions Precautions: None Restrictions Weight Bearing Restrictions: No      Mobility Bed Mobility Overal bed mobility: Modified Independent                Transfers Overall transfer level: Needs assistance Equipment used: None Transfers: Sit to/from Stand Sit to Stand: Supervision         General transfer comment: Supervision for safety due to some impulsivity and line management.    Balance Overall balance assessment: Needs assistance Sitting-balance support: No upper extremity supported;Feet supported Sitting balance-Leahy Scale: Normal     Standing balance support: No upper extremity supported;During functional activity Standing balance-Leahy Scale: Good                              ADL Overall ADL's : Needs assistance/impaired     Grooming: Wash/dry  hands;Supervision/safety;Standing   Upper Body Bathing: Supervision/ safety;Sitting   Lower Body Bathing: Supervison/ safety;Sit to/from stand           Toilet Transfer: Supervision/safety;Ambulation;Regular Toilet   Toileting- Water quality scientist and Hygiene: Supervision/safety;Sit to/from stand   Tub/ Shower Transfer: Walk-in shower;Supervision/safety;Ambulation   Functional mobility during ADLs: Supervision/safety General ADL Comments: Supervision mainly for impulsivity and line management.     Vision Vision Assessment?: Yes Eye Alignment: Within Functional Limits Ocular Range of Motion: Within Functional Limits Alignment/Gaze Preference: Within Defined Limits Tracking/Visual Pursuits: Able to track stimulus in all quads without difficulty Saccades: Decreased speed of saccadic movement;Additional head turns occurred during testing Convergence: Impaired (comment) (L eye does not move medially) Visual Fields: No apparent deficits Additional Comments: During convergence testing, pt's L eye noted to not move medially. Horizontal nystagmus noted with tracking. Advised pt to follow up with current eye doctor   Perception Perception Perception Tested?: Yes Perception Deficits: Inattention/neglect Inattention/Neglect: Appears intact   Praxis Praxis Praxis tested?: Within functional limits    Pertinent Vitals/Pain Pain Assessment: No/denies pain     Hand Dominance Right   Extremity/Trunk Assessment Upper Extremity Assessment Upper Extremity Assessment: RUE deficits/detail RUE Deficits / Details: very subtle weakness in all muscle groups of RUE, overall 4+/5; AROM wfl; pt reports some numbness in fingers from previous chainsaw accident RUE Sensation:  (numbness in fingers)   Lower Extremity Assessment Lower Extremity Assessment: RLE deficits/detail RLE Deficits / Details: mild weakness in RLE muscle groups, overall 4+/5; AROM wfl; no numbness/tingling reported  Cervical / Trunk Assessment Cervical / Trunk Assessment: Normal   Communication Communication Communication: No difficulties   Cognition Arousal/Alertness: Awake/alert Behavior During Therapy: WFL for tasks assessed/performed Overall Cognitive Status: No family/caregiver present to determine baseline cognitive functioning                     General Comments       Exercises       Shoulder Instructions      Home Living Family/patient expects to be discharged to:: Private residence Living Arrangements: Spouse/significant other Available Help at Discharge: Family;Available PRN/intermittently (wife works during the day M-F) Type of Home: House Home Access: Stairs to enter [employer] of Steps: 2 Entrance Stairs-Rails: Right Home Layout: One level     Bathroom Shower/Tub: Walk-in shower;Door   [employer] Toilet: Standard     Home Equipment: Environmental consultant - 2 wheels;Cane - single point          Prior Functioning/Environment Level of Independence: Independent with assistive device(s)        Comments: Occasionally uses [facility] for community mobility, drives, used to be a Dietitian in the [employer]    OT Diagnosis: Paresis   OT Problem List: Decreased strength;Impaired balance (sitting and/or standing);Decreased safety awareness;Decreased cognition   OT Treatment/Interventions: Self-care/ADL training;Therapeutic exercise;DME and/or AE instruction;Therapeutic activities;Patient/family education;Balance training    OT Goals(Current goals can be found in the care plan section) Acute Rehab OT Goals Patient Stated Goal: to get some rest OT Goal Formulation: With patient Time For Goal Achievement: 01/02/16 Potential to Achieve Goals: Good ADL Goals Pt Will Transfer to Toilet: (P) with modified independence;ambulating;regular height toilet Pt Will Perform Toileting - Clothing Manipulation and hygiene: (P) with modified independence;sit to/from stand  OT  Frequency: Min 2X/week   Barriers to D/C:            Co-evaluation              End of Session Equipment Utilized During Treatment: Gait belt Nurse Communication: Mobility status  Activity Tolerance: Patient tolerated treatment well Patient left: in bed;with call bell/phone within reach;with bed alarm set   Time: [id number] OT Time Calculation (min): 30 min Charges:  OT General Charges $OT Visit: 1 Procedure OT Evaluation $OT Eval Moderate Complexity: 1 Procedure OT Treatments $Self Care/Home Management : 8-22 mins G-Codes: OT G-codes **NOT FOR INPATIENT CLASS** Functional Assessment Tool Used: clinical judgement Functional Limitation: Self care Self Care Current Status [id number]): At least 1 percent but less than 20 percent impaired, limited or restricted Self Care Goal Status [id number]): 0 percent impaired, limited or restricted  Redmond Baseman, OTR/L Pager: [phone] 12/19/2015, 12:00 PM

## 2015-12-20 ENCOUNTER — Observation Stay (HOSPITAL_BASED_OUTPATIENT_CLINIC_OR_DEPARTMENT_OTHER): Payer: Medicare Other

## 2015-12-20 DIAGNOSIS — E11319 Type 2 diabetes mellitus with unspecified diabetic retinopathy without macular edema: Secondary | ICD-10-CM | POA: Diagnosis not present

## 2015-12-20 DIAGNOSIS — G459 Transient cerebral ischemic attack, unspecified: Secondary | ICD-10-CM | POA: Diagnosis not present

## 2015-12-20 DIAGNOSIS — E785 Hyperlipidemia, unspecified: Secondary | ICD-10-CM | POA: Diagnosis not present

## 2015-12-20 DIAGNOSIS — I10 Essential (primary) hypertension: Secondary | ICD-10-CM | POA: Diagnosis not present

## 2015-12-20 DIAGNOSIS — I639 Cerebral infarction, unspecified: Secondary | ICD-10-CM | POA: Diagnosis not present

## 2015-12-20 LAB — VAS US CAROTID
LCCADDIAS: -12 cm/s
LCCADSYS: -77 cm/s
LEFT ECA DIAS: -10 cm/s
LEFT VERTEBRAL DIAS: 8 cm/s
LICADDIAS: -26 cm/s
LICADSYS: -85 cm/s
Left CCA prox dias: 16 cm/s
Left CCA prox sys: 99 cm/s
Left ICA prox dias: -21 cm/s
Left ICA prox sys: -77 cm/s
RCCADSYS: -74 cm/s
RCCAPSYS: 115 cm/s
RIGHT ECA DIAS: -8 cm/s
RIGHT VERTEBRAL DIAS: -7 cm/s
Right CCA prox dias: 11 cm/s

## 2015-12-20 LAB — GLUCOSE, CAPILLARY
GLUCOSE-CAPILLARY: 162 mg/dL — AB (ref 65–99)
GLUCOSE-CAPILLARY: 146 mg/dL — AB (ref 65–99)

## 2015-12-20 MED ORDER — ASPIRIN 325 MG PO TABS: 325.0000 mg | ORAL_TABLET | Freq: Every day | ORAL | 0 refills | Status: DC

## 2015-12-20 NOTE — Care Management Note (Signed)
Case Management Note  Patient Details  Name: Vincent Alvarez MRN: [id number] Date of Birth: November 22, 1940  Subjective/Objective:  Pt in with TIA. He is from home with his spouse.                  Action/Plan: PT recommending outpatient therapy. MD in agreement. CM met with the patient and his wife and asked about [facility]. He was interested. Orders placed in EPIC and information on the AVS.   Expected Discharge Date:  12/19/15               Expected Discharge Plan:  Home/Self Care  In-House Referral:     Discharge planning Services  CM Consult  Post Acute Care Choice:    Choice offered to:     DME Arranged:    DME Agency:     HH Arranged:    HH Agency:     Status of Service:  Completed, signed off  If discussed at [employer] of Stay Meetings, dates discussed:    Additional Comments:  Pollie Friar, RN 12/20/2015, 3:51 PM

## 2015-12-20 NOTE — Progress Notes (Signed)
Nutrition Brief Note  Received consult per TIA/CVA Protocol. Patient reports good appetite and intake now and PTA. No recent change in weight. Wife is the cook and she says she is too tired to cook "from scratch," therefore, they eat a lot of processed, high sodium, high fat foods.  Wt Readings from Last 15 Encounters:  12/18/15 229 lb 6.4 oz (104.1 kg)    Body mass index is 34.88 kg/m. Patient meets criteria for obesity based on current BMI.   Current diet order is Heart healthy, CHO modified, patient is consuming approximately 100% of meals at this time. Labs and medications reviewed.   At patient's request, provided "TIA Nutrition Therapy" handout from the [employer] of [employer]. Wife and husband to review and contact RD with any questions. RD contact information provided.   No further nutrition interventions warranted at this time. If nutrition issues arise, please consult RD.   Molli Barrows, RD, LDN, [place] Pager [phone] After Hours Pager [phone]

## 2015-12-20 NOTE — Discharge Summary (Addendum)
Physician Discharge Summary  Vincent Alvarez [id number] DOB: 1940-10-23 DOA: 12/18/2015  PCP:  Melinda Crutch, MD  Admit date: 12/18/2015 Discharge date: 12/20/2015  Admitted From: Home Disposition:  Home  Recommendations for Outpatient Follow-up:  1. Follow up with PCP in 1-2 weeks 2. Please obtain BMP/CBC in one week 3. Please follow up on the following pending results: Final Echocardiogram results from 12/20/15. 4. Please arrange outpatient PT for patient   Discharge Condition: STABLE CODE STATUS: FULL Diet recommendation: Heart Healthy / Carb Modified  Brief/Interim Summary: Hospital course: Suhan Dumas Whitworthis a 75 y.o.malewith medical history significant of DM with retinopathy, depression, HTN, HLD presenting with possible CVA. Patient reports that he was watching a ball game about 1430 and noticed R-sided facial numbness. Half of his tongue and gums on right felt like he had a shot of novicaine. Then his arm started going numb, he couldn't hold a glass of tea without it turning over. Also with difficulty word finding, expressive aphasia and some slurring and right facial droop noticed by wife. Went to [facility] and was transferred to [facility]. No h/o prior. He did have a concussion from a fall 10-12 years ago. Only symptom nowis a frontal headache which started after all of the other symptoms.  Assessment & Plan:   TIA -Symptoms initially concerning for CVA, although symptoms did resolve quickly  -completing TIA evaluation -Telemetry monitoring: no acute findings. -MRI/MRA negative for acute findings, no CVA seen.  -Carotid dopplers: no significant stenosis.  -Echo pending at time of discharge.  -Risk stratification with FLP, A1c - 7.3%.   Further glycemic control recommendations outpatient with PCP.  -ASA daily for antiplatelet therapy - Follow up final echo results with PCP.   HTN -allowing permissive hypertension, currently holding CCB, alpha blocker,and ACE;plan to  restart at discharge, follow up with PCP.  HLD -Continue Lipitor -lipids optimally controlled, LDL 40  DM, type 2, non insulin requiring with vascular complications, uncontrolled -as evidenced by A1c 7.3%, goal <7.  -hold Glucophage and Januvia -Cover with SSI in hospital., resume home meds at discharge.  CBG (last 3)   Recent Labs (last 2 labs)    Recent Labs  12/19/15 1537 12/19/15 1959 12/20/15 0637  GLUCAP 140* 196* 162*     Depression -Continue Prozac  DVT prophylaxis:Lovenox  Code Status:DNR- confirmed with patient Family Communication:None present Disposition Plan:Home with outpatient PT, possibly dc later today if all studies come back Consults called:Neurology (by ER) Admission status:Observation to telemetry   Procedures:  MRI/MRA  Discharge Diagnoses:  Active Problems:   TIA (transient ischemic attack)   Diabetes mellitus, type 2 ([place])   Essential hypertension   Hyperlipidemia   Depression  Discharge Instructions  Discharge Instructions    Increase activity slowly    Complete by:  As directed       Medication List    TAKE these medications   amLODipine 5 MG tablet Commonly known as:  NORVASC Take 5 mg by mouth every morning.   aspirin 325 MG tablet Take 1 tablet (325 mg total) by mouth daily.   atorvastatin 20 MG tablet Commonly known as:  LIPITOR Take 20 mg by mouth every morning.   FLOMAX 0.4 MG Caps capsule Generic drug:  tamsulosin Take 0.4 mg by mouth daily after supper.   FLUoxetine 20 MG capsule Commonly known as:  PROZAC Take 20 mg by mouth every morning.   fluticasone 50 MCG/ACT nasal spray Commonly known as:  FLONASE Place 1 spray into both nostrils  daily as needed for allergies.   metFORMIN 500 MG tablet Commonly known as:  GLUCOPHAGE Take 1,000 mg by mouth 2 (two) times daily with a meal.   ramipril 10 MG capsule Commonly known as:  ALTACE Take 10 mg by mouth daily.   sitaGLIPtin 100 MG  tablet Commonly known as:  JANUVIA Take 100 mg by mouth daily.      Follow-up Information     Melinda Crutch, MD. Schedule an appointment as soon as possible for a visit in 1 week(s).   Specialty:  Family Medicine Why:  Hospital Follow Up Contact information: [place] [id number] [phone]          Allergies  Allergen Reactions  . Shrimp [Shellfish Allergy] Nausea And Vomiting    Procedures/Studies: Mr Brain Wo Contrast  Result Date: 12/19/2015 CLINICAL DATA:  Initial evaluation for acute right facial and arm numbness. Now improved. EXAM: MRI HEAD WITHOUT CONTRAST MRA HEAD WITHOUT CONTRAST TECHNIQUE: Multiplanar, multiecho pulse sequences of the brain and surrounding structures were obtained without intravenous contrast. Angiographic images of the head were obtained using MRA technique without contrast. COMPARISON:  Prior CT from 12/19/2015. FINDINGS: MRI HEAD FINDINGS Diffuse prominence of the CSF containing spaces is compatible with generalized cerebral atrophy, moderate nature. Patchy and confluent T2/FLAIR hyperintensity within the periventricular and deep white matter both cerebral hemispheres most consistent with chronic small vessel ischemic disease, also moderate nature. Probable small remote lacunar infarct noted within the left thalamus. No abnormal foci of restricted diffusion to suggest acute or subacute infarct. Gray-white matter differentiation maintained. Major intracranial vascular flow voids are preserved. No acute or chronic intracranial hemorrhage. No mass lesion, midline shift, or mass effect. No hydrocephalus. No extra-axial fluid collection. Major dural sinuses are grossly patent. Craniocervical junction within normal limits. Mild degenerate spondylolysis noted at C3-4 without significant stenosis. Remainder the visualized upper cervical spine unremarkable. Pituitary gland within normal limits. No acute abnormality about the globes and orbits.  Patient is status post lens extraction bilaterally. Mild mucosal thickening throughout the paranasal sinuses. No air-fluid level to suggest active sinus infection. Trace opacity within the right mastoid air cells. Inner ear structures grossly normal. Bone marrow signal intensity within normal limits. No scalp soft tissue abnormality. MRA HEAD FINDINGS ANTERIOR CIRCULATION: Distal cervical segments of the internal carotid arteries are patent with antegrade flow. Petrous, cavernous, and supraclinoid segments widely patent without significant stenosis. Left A1 segment dominant and widely patent. Right A1 segment hypoplastic, all likely accounts for the slightly diminutive right ICA as compared the left. Anterior communicating artery normal. Anterior cerebral arteries well opacified. M1 segments patent without stenosis or occlusion. MCA bifurcations normal. Distal MCA branches well opacified and symmetric. POSTERIOR CIRCULATION: Right vertebral artery dominant and widely patent to the vertebrobasilar junction. Left vertebral artery diffusely hypoplastic but patent as well. Basilar artery mildly tortuous but widely patent to its distal aspect. Superior cerebral arteries patent bilaterally. Both of the posterior cerebral arteries arise from the basilar artery are well opacified to their distal aspects. Small left posterior communicating artery noted. No aneurysm or vascular malformation. IMPRESSION: MRI HEAD IMPRESSION: 1. No acute intracranial infarct or other process identified. 2. 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 MRA HEAD IMPRESSION: Normal intracranial MRA. Electronically Signed   By: Jeannine Boga M.D.   On: 12/19/2015 06:45   Mr Jodene Nam Head/brain [id number] Cm  Result Date: 12/19/2015 CLINICAL DATA:  Initial evaluation for acute right facial and arm numbness. Now improved. EXAM:  MRI HEAD WITHOUT CONTRAST MRA HEAD WITHOUT CONTRAST TECHNIQUE: Multiplanar, multiecho pulse sequences  of the brain and surrounding structures were obtained without intravenous contrast. Angiographic images of the head were obtained using MRA technique without contrast. COMPARISON:  Prior CT from 12/19/2015. FINDINGS: MRI HEAD FINDINGS Diffuse prominence of the CSF containing spaces is compatible with generalized cerebral atrophy, moderate nature. Patchy and confluent T2/FLAIR hyperintensity within the periventricular and deep white matter both cerebral hemispheres most consistent with chronic small vessel ischemic disease, also moderate nature. Probable small remote lacunar infarct noted within the left thalamus. No abnormal foci of restricted diffusion to suggest acute or subacute infarct. Gray-white matter differentiation maintained. Major intracranial vascular flow voids are preserved. No acute or chronic intracranial hemorrhage. No mass lesion, midline shift, or mass effect. No hydrocephalus. No extra-axial fluid collection. Major dural sinuses are grossly patent. Craniocervical junction within normal limits. Mild degenerate spondylolysis noted at C3-4 without significant stenosis. Remainder the visualized upper cervical spine unremarkable. Pituitary gland within normal limits. No acute abnormality about the globes and orbits. Patient is status post lens extraction bilaterally. Mild mucosal thickening throughout the paranasal sinuses. No air-fluid level to suggest active sinus infection. Trace opacity within the right mastoid air cells. Inner ear structures grossly normal. Bone marrow signal intensity within normal limits. No scalp soft tissue abnormality. MRA HEAD FINDINGS ANTERIOR CIRCULATION: Distal cervical segments of the internal carotid arteries are patent with antegrade flow. Petrous, cavernous, and supraclinoid segments widely patent without significant stenosis. Left A1 segment dominant and widely patent. Right A1 segment hypoplastic, all likely accounts for the slightly diminutive right ICA as  compared the left. Anterior communicating artery normal. Anterior cerebral arteries well opacified. M1 segments patent without stenosis or occlusion. MCA bifurcations normal. Distal MCA branches well opacified and symmetric. POSTERIOR CIRCULATION: Right vertebral artery dominant and widely patent to the vertebrobasilar junction. Left vertebral artery diffusely hypoplastic but patent as well. Basilar artery mildly tortuous but widely patent to its distal aspect. Superior cerebral arteries patent bilaterally. Both of the posterior cerebral arteries arise from the basilar artery are well opacified to their distal aspects. Small left posterior communicating artery noted. No aneurysm or vascular malformation. IMPRESSION: MRI HEAD IMPRESSION: 1. No acute intracranial infarct or other process identified. 2. 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 MRA HEAD IMPRESSION: Normal intracranial MRA. Electronically Signed   By: Jeannine Boga M.D.   On: 12/19/2015 06:45   Ct Head Code Stroke W/o Cm  Result Date: 12/18/2015 CLINICAL DATA:  Code stroke. Right-sided weakness and numbness beginning today. EXAM: CT HEAD WITHOUT CONTRAST TECHNIQUE: Contiguous axial images were obtained from the base of the skull through the vertex without intravenous contrast. COMPARISON:  None. FINDINGS: There is no evidence of acute cortical infarct, intracranial hemorrhage, mass, midline shift, or extra-axial fluid collection. Ventricles and sulci are within normal limits for age. 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 Prior bilateral cataract extraction is noted. The visualized paranasal sinuses and mastoid air cells are clear. No acute osseous abnormality is seen. Calcified atherosclerosis is noted at the skullbase.  ASPECTS [facility] Stroke Program Early CT Score) - Ganglionic level infarction (caudate, lentiform nuclei, internal capsule, insula, M1-M3 cortex): 7 - Supraganglionic infarction (M4-M6 cortex): 3 Total score (0-10 with 10 being normal): 10 IMPRESSION: 1. No evidence of acute intracranial abnormality. 2. ASPECTS is  10. 3. Extensive chronic small vessel ischemic disease. These results were called by telephone at the time of interpretation on 12/18/2015 at 4:37 pm to Dr. Julianne Rice , who verbally acknowledged these results. Electronically Signed   By: Logan Bores M.D.   On: 12/18/2015 16:37     Subjective: Pt without complaints.  Feels much better.    Discharge Exam: Vitals:   12/20/15 1135 12/20/15 1414  BP: (!) 144/80 140/75  Pulse: 63   Resp: 19 (!) 21  Temp: 98.1 F (36.7 C) 98 F (36.7 C)   Vitals:   12/20/15 0205 12/20/15 0529 12/20/15 1135 12/20/15 1414  BP: (!) 146/81 (!) 146/80 (!) 144/80 140/75  Pulse: 71 68 63   Resp: 18 16 19  (!) 21  Temp: 98.1 F (36.7 C) 98 F (36.7 C) 98.1 F (36.7 C) 98 F (36.7 C)  TempSrc: Oral Oral Oral Oral  SpO2: 98% 96% 100% 100%  Weight:      Height:        General:Appears calm and comfortable and is NAD; very pleasant and conversant  Eyes:PERRL, EOMI, normal lids, iris  [id number] normal hearing, lips &tongue, mmm  Neck:no LAD, masses or thyromegaly  Cardiovascular:RRR, no m/r/g. No LE edema.   Respiratory:CTA bilaterally, no w/r/r. Normal respiratory effort.  Abdomen:soft, ntnd, NABS  Skin:no rash or induration seen on limited exam  Musculoskeletal:grossly normal tone BUE/BLE, good ROM, no bony abnormality  Psychiatric:grossly normal mood and affect, speech fluent and appropriate, AOx3  Neurologic:CN 2-12 grossly intact, moves all extremities in coordinated fashion, sensation intact   The results of significant diagnostics from this hospitalization (including imaging, microbiology, ancillary and  laboratory) are listed below for reference.     Microbiology: Recent Results (from the past 240 hour(s))  MRSA PCR Screening     Status: None   Collection Time: 12/18/15  8:49 PM  Result Value Ref Range Status   MRSA by PCR NEGATIVE NEGATIVE Final    Comment:        The GeneXpert MRSA Assay (FDA approved for NASAL specimens only), is one component of a comprehensive MRSA colonization surveillance program. It is not intended to diagnose MRSA infection nor to guide or monitor treatment for MRSA infections.      Labs: BNP (last 3 results) No results for input(s): BNP in the last 8760 hours. Basic Metabolic Panel:  Recent Labs Lab 12/18/15 1615 12/18/15 1628  NA 135 136  K 4.1 4.1  CL 101 97*  CO2 26  --   GLUCOSE 166* 158*  BUN 19 18  CREATININE 0.92 1.00  CALCIUM 9.9  --    Liver Function Tests:  Recent Labs Lab 12/18/15 1615  AST 20  ALT 25  ALKPHOS 68  BILITOT 0.7  PROT 7.3  ALBUMIN 4.4   No results for input(s): LIPASE, AMYLASE in the last 168 hours. No results for input(s): AMMONIA in the last 168 hours. CBC:  Recent Labs Lab 12/18/15 1615 12/18/15 1628  WBC 7.4  --   NEUTROABS 4.4  --   HGB 14.2 14.6  HCT 41.0 43.0  MCV 87.8  --   PLT 209  --    Cardiac Enzymes: No results for input(s): CKTOTAL, CKMB, CKMBINDEX, TROPONINI in the last 168 hours. BNP: Invalid input(s): POCBNP CBG:  Recent Labs Lab 12/19/15 1046 12/19/15 1537 12/19/15 1959 12/20/15 0637 12/20/15 1135  GLUCAP 209* 140* 196* 162* 146*   D-Dimer No results for input(s): DDIMER in the last 72 hours. Hgb  A1c  Recent Labs  12/19/15 0353  HGBA1C 7.3*   Lipid Profile  Recent Labs  12/19/15 0353  CHOL 114  HDL 46  LDLCALC 40  TRIG 141  CHOLHDL 2.5   Thyroid function studies No results for input(s): TSH, T4TOTAL, T3FREE, THYROIDAB in the last 72 hours.  Invalid input(s): FREET3 Anemia work up No results for input(s): VITAMINB12, FOLATE, FERRITIN, TIBC,  IRON, RETICCTPCT in the last 72 hours. Urinalysis No results found for: COLORURINE, APPEARANCEUR, [place], [place], [place], [place], [place], [place], PROTEINUR, UROBILINOGEN, NITRITE, LEUKOCYTESUR Sepsis Labs Invalid input(s): PROCALCITONIN,  WBC,  LACTICIDVEN Microbiology Recent Results (from the past 240 hour(s))  MRSA PCR Screening     Status: None   Collection Time: 12/18/15  8:49 PM  Result Value Ref Range Status   MRSA by PCR NEGATIVE NEGATIVE Final    Comment:        The GeneXpert MRSA Assay (FDA approved for NASAL specimens only), is one component of a comprehensive MRSA colonization surveillance program. It is not intended to diagnose MRSA infection nor to guide or monitor treatment for MRSA infections.    Time coordinating discharge: Over 30 minutes  SIGNED:   Irwin Brakeman, MD  [facility] 12/20/2015, 3:04 PM Pager   If 7PM-7AM, please contact night-coverage [email or web address] Password [email or web address]

## 2015-12-20 NOTE — Progress Notes (Signed)
Physical Therapy Treatment Patient Details Name: Vincent Alvarez MRN: [id number] DOB: 1940/05/17 Today's Date: 12/20/2015    History of Present Illness 75 y.o. male admitted for difficulty speaking, R sided facial numbness, R grip weakness and numbness. MRI (-) for acute infarct. PMH significant for HTN, DM type II, atypical chest pain, gout, hearing loss, and depression.    PT Comments    Patient is progressing toward mobility goals. Supervision/min guard for OOB mobility. Gait/stair training with SPC this session. Current plan remains appropriate.   Follow Up Recommendations  Outpatient PT;Supervision - Intermittent     Equipment Recommendations  None recommended by PT (pt reported having SPC at home)    Recommendations for Other Services       Precautions / Restrictions Precautions Precautions: Fall Restrictions Weight Bearing Restrictions: No    Mobility  Bed Mobility Overal bed mobility: Modified Independent             General bed mobility comments: HOB elevated   Transfers Overall transfer level: Needs assistance Equipment used: None Transfers: Sit to/from Stand Sit to Stand: Supervision         General transfer comment: supervision for safety  Ambulation/Gait Ambulation/Gait assistance: Supervision Ambulation Distance (Feet): 220 Feet Assistive device: Straight cane Gait Pattern/deviations: Step-through pattern Gait velocity: decreased   General Gait Details: slow, grossly steady gait with some unsteadiness noted with horizontal heads turns especially toward the L side   Stairs   Stairs assistance: Supervision Stair Management: One rail Right;Alternating pattern Number of Stairs: 2 General stair comments: cues for sequencing with use of SPC  Wheelchair Mobility    Modified Rankin (Stroke Patients Only)       Balance                 Single Leg Stance - Right Leg:  (unable without external support) Single Leg Stance - Left  Leg:  (unable without external support) Tandem Stance - Right Leg:  (pt with immeadiate LOB, unable to maintain) Tandem Stance - Left Leg:  (immeadiate LOB, unable to maintain without external support)            Cognition Arousal/Alertness: Awake/alert Behavior During Therapy: WFL for tasks assessed/performed Overall Cognitive Status: Within Functional Limits for tasks assessed                      Exercises      General Comments        Pertinent Vitals/Pain Pain Assessment: No/denies pain    Home Living                      Prior Function            PT Goals (current goals can now be found in the care plan section) Acute Rehab PT Goals Patient Stated Goal: go home PT Goal Formulation: With patient/family Time For Goal Achievement: 12/26/15 Potential to Achieve Goals: Good Progress towards PT goals: Progressing toward goals    Frequency  Min 3X/week    PT Plan Current plan remains appropriate    Co-evaluation             End of Session Equipment Utilized During Treatment: Gait belt Activity Tolerance: Patient tolerated treatment well Patient left: in chair;with call bell/phone within reach;with chair alarm set     Time: [id number] PT Time Calculation (min) (ACUTE ONLY): 23 min  Charges:  $Gait Training: 8-22 mins $Therapeutic Activity: 8-22 mins  G Codes:     [place]ina April, PTA Pager: [phone]   12/20/2015, 3:16 PM

## 2015-12-20 NOTE — Progress Notes (Signed)
STROKE TEAM PROGRESS NOTE   HISTORY OF PRESENT ILLNESS (per record) Vincent Alvarez is a 75 y.o. male who complained of right facial and arm numbness started around 2:30 PM. He states that he was dropping things repeatedly from his right arm. He then noticed significant improvement after coming to the hospital. Initially, Code stroke was considered, but given the improvement in the mild symptoms, it was decided that TPA was not indicated.   LKW: 2:30 PM tpa given?: no, mild symptoms, rapidly improving symptoms   SUBJECTIVE (INTERVAL HISTORY) His family is not at the bedside and able to describe the event that lasted approximately 2 hours.     Overall he feels his condition is completely resolved.    OBJECTIVE Temp:  [98 F (36.7 C)-98.4 F (36.9 C)] 98 F (36.7 C) (09/04 1414) Pulse Rate:  [62-71] 63 (09/04 1135) Cardiac Rhythm: Normal sinus rhythm (09/04 0744) Resp:  [16-21] 21 (09/04 1414) BP: (123-164)/(73-86) 140/75 (09/04 1414) SpO2:  [95 %-100 %] 100 % (09/04 1414)  CBC:   Recent Labs Lab 12/18/15 1615 12/18/15 1628  WBC 7.4  --   NEUTROABS 4.4  --   HGB 14.2 14.6  HCT 41.0 43.0  MCV 87.8  --   PLT 209  --     Basic Metabolic Panel:   Recent Labs Lab 12/18/15 1615 12/18/15 1628  NA 135 136  K 4.1 4.1  CL 101 97*  CO2 26  --   GLUCOSE 166* 158*  BUN 19 18  CREATININE 0.92 1.00  CALCIUM 9.9  --     Lipid Panel:     Component Value Date/Time   CHOL 114 12/19/2015 0353   TRIG 141 12/19/2015 0353   HDL 46 12/19/2015 0353   CHOLHDL 2.5 12/19/2015 0353   VLDL 28 12/19/2015 0353   LDLCALC 40 12/19/2015 0353   HgbA1c:  Lab Results  Component Value Date   HGBA1C 7.3 (H) 12/19/2015   Urine Drug Screen:     Component Value Date/Time   LABOPIA NONE DETECTED 12/19/2015 0300   COCAINSCRNUR NONE DETECTED 12/19/2015 0300   LABBENZ NONE DETECTED 12/19/2015 0300   AMPHETMU NONE DETECTED 12/19/2015 0300   THCU NONE DETECTED 12/19/2015 0300   LABBARB  NONE DETECTED 12/19/2015 0300      IMAGING  Mr Vincent Alvarez Head/brain Wo Cm 12/19/2015  MRI HEAD  1. No acute intracranial infarct or other process identified.  2. 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   MRA HEAD  Normal intracranial MRA.   Ct Head Code Stroke W/o Cm 12/18/2015 1. No evidence of acute intracranial abnormality.  2. ASPECTS is 10.  3. Extensive chronic small vessel ischemic disease.     PHYSICAL EXAM Constitutional: Appears well-developed and well-nourished.  Psych: Affect appropriate to situation Eyes: No scleral injection HENT: No OP obstrucion Head: Normocephalic.  Cardiovascular: Normal rate and regular rhythm.  Respiratory: Effort normal and breath sounds normal to anterior ascultation GI: Soft.  No distension. There is no tenderness.  Skin: WDI  Neuro: Mental Status: Patient is awake, alert, oriented to person, place, month, year, and situation. Patient is able to give a clear and coherent history. Patient did have a few moments when he had difficulty thinking of a word.  He states this is much better than yesterday when he had trouble finding words and reading words Cranial Nerves: II: Visual Fields are full. Pupils are equal, round, and reactive to light.   III,IV, VI: EOMI without ptosis or  diploplia.  V: Facial sensation is symmetric to temperature VII: Facial movement is very slightly weak on the right VIII: hearing is intact to voice; does wear hearing devices X: Uvula elevates symmetrically XI: Shoulder shrug is symmetric. XII: tongue is midline without atrophy or fasciculations.  Motor: Tone is normal. Bulk is normal. 5/5 strength was present in the left arm and leg, 4+/5 weakness to confrontation in the right arm and leg without drift. Sensory: Sensation is symmetric to light touch and temperature in the arms and legs. Cerebellar: FNF and HKS are intact on the right, on the left he has mild horizontal tremor on  finger nose finger of the left arm as well as difficulty with rapid repetitive movements in the left arm. This is very mild and there is no passpointing.   ASSESSMENT/PLAN [name] is a 75 y.o. male with history of hypertension, hyperlipidemia, hearing loss, gout, diabetes mellitus, and depression  presenting with. right facial and arm numbness as well as dropping things from his right upper extremity. He did not receive IV t-PA due to mild deficits.  Possible TIA:  Dominant   Resultant  Right-sided weakness; word finding trouble  MRI  No acute intracranial infarct or other process identified.  MRA  negative  Carotid Doppler - Bilateral:  1-39% ICA stenosis.  Vertebral artery flow is antegrade.    2D Echo - pending  LDL 40  HgbA1c 7.3  VTE prophylaxis - Lovenox Diet heart healthy/carb modified Room service appropriate? Yes; Fluid consistency: Thin  No antithrombotic prior to admission, now on aspirin 325 mg daily  Patient counseled to be compliant with his antithrombotic medications  Ongoing aggressive stroke risk factor management Therapy recommendations:  outpt  Disposition: home Hypertension  Stable  Permissive hypertension (OK if < 220/120) but gradually normalize in 5-7 days  Long-term BP goal normotensive  Hyperlipidemia  Home meds:  Lipitor 20 mg daily resumed in hospital  LDL 40, goal < 70  Continue statin at discharge  Diabetes  HgbA1c pending, goal < 7.0  Uncontrolled  Other Stroke Risk Factors  Advanced age  Cigarette smoker - quit 1987  ETOH use - 2 glasses of wine per week  Obesity, Body mass index is 34.88 kg/m., recommend weight loss, diet and exercise as appropriate   Other Active Problems    Hospital day # 0   I have personally examined this patient, reviewed notes, independently viewed imaging studies, participated in medical decision making and plan of care. I have made any additions or clarifications directly to  the above note. Patient likely had a TIA secondary to small vessel disease. Recommend aspirin for stroke prevention. Check echocardiogram results in indentation can be discharged home. Follow-up as an outpatient in stroke clinic in 2 months and the primary care physician in 2 weeks. Discuss with Dr. Irwin Brakeman. Greater than 50% time during this 25 minute visit was spent on counseling and coordination of care about stroke risk, prevention and treatment.  Antony Contras, MD Medical Director [facility] Pager: [phone] 12/20/2015 2:55 PM  To contact Stroke Continuity provider, please refer to [email or web address] After hours, contact General Neurology

## 2015-12-20 NOTE — Progress Notes (Signed)
  Echocardiogram 2D Echocardiogram has been performed.  Vincent Alvarez 12/20/2015, 1:08 PM

## 2015-12-20 NOTE — Evaluation (Signed)
Speech Language Pathology Evaluation Patient Details Name: Vincent Alvarez MRN: [id number] DOB: [birth year] Today's Date: 12/20/2015 Time: 75-1410 SLP Time Calculation (min) (ACUTE ONLY): 21 min  Problem List:  Patient Active Problem List   Diagnosis Date Noted  . TIA (transient ischemic attack) 12/18/2015  . Diabetes mellitus, type 2 ([place]) 12/18/2015  . Essential hypertension 12/18/2015  . Hyperlipidemia 12/18/2015  . Depression 12/18/2015   Past Medical History:  Past Medical History:  Diagnosis Date  . Atypical chest pain   . Depression   . Diabetes mellitus without complication ([employer])   . Diabetic retinopathy ([place])   . Gout   . Hearing loss   . Hyperlipidemia   . Hypertension   . Onychomycosis   . UTI (lower urinary tract infection)    Past Surgical History:  Past Surgical History:  Procedure Laterality Date  . CATARACT EXTRACTION, BILATERAL    . CHOLECYSTECTOMY    . COLONOSCOPY    . TONSILLECTOMY     HPI:  75 y.o.malewith medical history significant of DM with retinopathy, depression, HTN, HLD, hearing loss presenting with right sided facial numbness, word finding diffculty and slurring. MRI no acute intracranial infarct or other process identified. MRA normal.   Assessment / Plan / Recommendation Clinical Impression  Pt scored a 27/30 on the [facility] standardized assessment. Pt reports on instance of dysnomia today but otherwise symptoms have resolved. Communication-cognitive-linguistic abilities are within functional limits and no further ST needed. Pt and wife in agreement.    SLP Assessment  Patient does not need any further Speech Lanaguage Pathology Services    Follow Up Recommendations  None    Frequency and Duration           SLP Evaluation Prior Functioning  Cognitive/Linguistic Baseline: Within functional limits Type of Home: House  Lives With: Spouse Available Help at Discharge: Family;Available PRN/intermittently Vocation: Retired    Associate Professor  Overall Cognitive Status: Within Functional Limits for tasks assessed Safety/Judgment: Appears intact    Comprehension  Auditory Comprehension Overall Auditory Comprehension: Appears within functional limits for tasks assessed Visual Recognition/Discrimination Discrimination: Not tested Reading Comprehension Reading Status: Within funtional limits    Expression Expression Primary Mode of Expression: Verbal Verbal Expression Overall Verbal Expression: Appears within functional limits for tasks assessed Written Expression Dominant Hand: Right Written Expression: Not tested   Oral / Motor  Oral Motor/Sensory Function Overall Oral Motor/Sensory Function: Within functional limits Motor Speech Overall Motor Speech: Appears within functional limits for tasks assessed   GO          Functional Assessment Tool Used: skilled clinical judgement Functional Limitations: Spoken language comprehension Spoken Language Comprehension Current Status [id number]): 0 percent impaired, limited or restricted Spoken Language Comprehension Goal Status [id number]): 0 percent impaired, limited or restricted Spoken Language Comprehension Discharge Status [phone]): 0 percent impaired, limited or restricted         Houston Siren 12/20/2015, 4:04 PM  Orbie Pyo Colvin Caroli.Ed [employer] [phone]

## 2015-12-20 NOTE — Care Management Obs Status (Signed)
[place] NOTIFICATION   Patient Details  Name: Vincent Alvarez MRN: [id number] Date of Birth: [birth year]   Medicare Observation Status Notification Given:  Yes (MRI negative)    Pollie Friar, RN 12/20/2015, 12:27 PM

## 2015-12-20 NOTE — Progress Notes (Signed)
PROGRESS NOTE   TILL LOUPE  [id number]  DOB: Aug 22, 1940  DOA: 12/18/2015 PCP: No primary care provider on file. Outpatient Specialists:  Hospital course: Vincent Alvarez is a 75 y.o. male with medical history significant of DM with retinopathy, depression, HTN, HLD presenting with possible CVA.  Patient reports that he was watching a ball game about 1430 and noticed R-sided facial numbness.  Half of his tongue and gums on right felt like he had a shot of novicaine.  Then his arm started going numb, he couldn't hold a glass of tea without it turning over.   Also with difficulty word finding, expressive aphasia and some slurring and right facial droop noticed by wife.  Went to [facility] and was transferred to [facility].  No h/o prior.  He did have a concussion from a fall 10-12 years ago.  Only symptom now is a frontal headache which started after all of the other symptoms.  Assessment & Plan:   TIA -Symptoms initially concerning for CVA, although symptoms did resolve quickly  -completing TIA evaluation -Telemetry monitoring: no acute findings. -MRI/MRA negative for acute findings, no CVA seen.  -Carotid dopplers: no significant stenosis.  -Echo pending -Risk stratification with FLP, A1c - 7.3%.    -ASA daily for antiplatelet therapy -Await further stroke team recommendations.  HTN -allowing permissive hypertension, currently holding CCB, alpha blocker, and ACE; plan to restart in 48-72 hours  HLD -Continue Lipitor -lipids optimally controlled, LDL 40  DM, type 2, non insulin requiring with vascular complications, uncontrolled -as evidenced by A1c 7.3%, goal <7.  -hold Glucophage and Januvia -Cover with SSI  CBG (last 3)   Recent Labs  12/19/15 1537 12/19/15 1959 12/20/15 0637  GLUCAP 140* 196* 162*   Depression -Continue Prozac  DVT prophylaxis:  Lovenox  Code Status: DNR - confirmed with patient Family Communication: None present Disposition Plan: Home with  outpatient PT, possibly dc later today if all studies come back Consults called: Neurology (by ER)  Admission status: Observation to telemetry   Procedures:  MRI/MRA  Subjective: Pt without complaints today.  Objective: Vitals:   12/19/15 1724 12/19/15 2134 12/20/15 0205 12/20/15 0529  BP: (!) 147/82 (!) 164/86 (!) 146/81 (!) 146/80  Pulse: 62 70 71 68  Resp: 20 20 18 16   Temp: 98.2 F (36.8 C) 98 F (36.7 C) 98.1 F (36.7 C) 98 F (36.7 C)  TempSrc: Oral Oral Oral Oral  SpO2:  95% 98% 96%  Weight:      Height:        Intake/Output Summary (Last 24 hours) at 12/20/15 0826 Last data filed at 12/20/15 0640  Gross per 24 hour  Intake           1917.5 ml  Output                0 ml  Net           1917.5 ml   Filed Weights   12/18/15 1628 12/18/15 2026  Weight: 101.2 kg (223 lb) 104.1 kg (229 lb 6.4 oz)   Exam:   General: Appears calm and comfortable and is NAD; very pleasant and conversant  Eyes:  PERRL, EOMI, normal lids, iris  ENT:  grossly normal hearing, lips & tongue, mmm  Neck:  no LAD, masses or thyromegaly  Cardiovascular:  RRR, no m/r/g. No LE edema.   Respiratory:  CTA bilaterally, no w/r/r. Normal respiratory effort.  Abdomen:  soft, ntnd, NABS  Skin:  no rash  or induration seen on limited exam  Musculoskeletal:  grossly normal tone BUE/BLE, good ROM, no bony abnormality  Psychiatric:  grossly normal mood and affect, speech fluent and appropriate, AOx3  Neurologic:  CN 2-12 grossly intact, moves all extremities in coordinated fashion, sensation intact  Data Reviewed: Basic Metabolic Panel:  Recent Labs Lab 12/18/15 1615 12/18/15 1628  NA 135 136  K 4.1 4.1  CL 101 97*  CO2 26  --   GLUCOSE 166* 158*  BUN 19 18  CREATININE 0.92 1.00  CALCIUM 9.9  --    Liver Function Tests:  Recent Labs Lab 12/18/15 1615  AST 20  ALT 25  ALKPHOS 68  BILITOT 0.7  PROT 7.3  ALBUMIN 4.4   No results for input(s): LIPASE, AMYLASE in the  last 168 hours. No results for input(s): AMMONIA in the last 168 hours. CBC:  Recent Labs Lab 12/18/15 1615 12/18/15 1628  WBC 7.4  --   NEUTROABS 4.4  --   HGB 14.2 14.6  HCT 41.0 43.0  MCV 87.8  --   PLT 209  --    Cardiac Enzymes: No results for input(s): CKTOTAL, CKMB, CKMBINDEX, TROPONINI in the last 168 hours. CBG (last 3)   Recent Labs  12/19/15 1537 12/19/15 1959 12/20/15 0637  GLUCAP 140* 196* 162*   Recent Results (from the past 240 hour(s))  MRSA PCR Screening     Status: None   Collection Time: 12/18/15  8:49 PM  Result Value Ref Range Status   MRSA by PCR NEGATIVE NEGATIVE Final    Comment:        The GeneXpert MRSA Assay (FDA approved for NASAL specimens only), is one component of a comprehensive MRSA colonization surveillance program. It is not intended to diagnose MRSA infection nor to guide or monitor treatment for MRSA infections.      Studies: Mr Brain Wo Contrast  Result Date: 12/19/2015 CLINICAL DATA:  Initial evaluation for acute right facial and arm numbness. Now improved. EXAM: MRI HEAD WITHOUT CONTRAST MRA HEAD WITHOUT CONTRAST TECHNIQUE: Multiplanar, multiecho pulse sequences of the brain and surrounding structures were obtained without intravenous contrast. Angiographic images of the head were obtained using MRA technique without contrast. COMPARISON:  Prior CT from 12/19/2015. FINDINGS: MRI HEAD FINDINGS Diffuse prominence of the CSF containing spaces is compatible with generalized cerebral atrophy, moderate nature. Patchy and confluent T2/FLAIR hyperintensity within the periventricular and deep white matter both cerebral hemispheres most consistent with chronic small vessel ischemic disease, also moderate nature. Probable small remote lacunar infarct noted within the left thalamus. No abnormal foci of restricted diffusion to suggest acute or subacute infarct. Gray-white matter differentiation maintained. Major intracranial vascular flow voids  are preserved. No acute or chronic intracranial hemorrhage. No mass lesion, midline shift, or mass effect. No hydrocephalus. No extra-axial fluid collection. Major dural sinuses are grossly patent. Craniocervical junction within normal limits. Mild degenerate spondylolysis noted at C3-4 without significant stenosis. Remainder the visualized upper cervical spine unremarkable. Pituitary gland within normal limits. No acute abnormality about the globes and orbits. Patient is status post lens extraction bilaterally. Mild mucosal thickening throughout the paranasal sinuses. No air-fluid level to suggest active sinus infection. Trace opacity within the right mastoid air cells. Inner ear structures grossly normal. Bone marrow signal intensity within normal limits. No scalp soft tissue abnormality. MRA HEAD FINDINGS ANTERIOR CIRCULATION: Distal cervical segments of the internal carotid arteries are patent with antegrade flow. Petrous, cavernous, and supraclinoid segments widely patent without significant stenosis.  Left A1 segment dominant and widely patent. Right A1 segment hypoplastic, all likely accounts for the slightly diminutive right ICA as compared the left. Anterior communicating artery normal. Anterior cerebral arteries well opacified. M1 segments patent without stenosis or occlusion. MCA bifurcations normal. Distal MCA branches well opacified and symmetric. POSTERIOR CIRCULATION: Right vertebral artery dominant and widely patent to the vertebrobasilar junction. Left vertebral artery diffusely hypoplastic but patent as well. Basilar artery mildly tortuous but widely patent to its distal aspect. Superior cerebral arteries patent bilaterally. Both of the posterior cerebral arteries arise from the basilar artery are well opacified to their distal aspects. Small left posterior communicating artery noted. No aneurysm or vascular malformation. IMPRESSION: MRI HEAD IMPRESSION: 1. No acute intracranial infarct or other  process identified. 2. 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 MRA HEAD IMPRESSION: Normal intracranial MRA. Electronically Signed   By: Jeannine Boga M.D.   On: 12/19/2015 06:45   Mr Jodene Nam Head/brain [id number] Cm  Result Date: 12/19/2015 CLINICAL DATA:  Initial evaluation for acute right facial and arm numbness. Now improved. EXAM: MRI HEAD WITHOUT CONTRAST MRA HEAD WITHOUT CONTRAST TECHNIQUE: Multiplanar, multiecho pulse sequences of the brain and surrounding structures were obtained without intravenous contrast. Angiographic images of the head were obtained using MRA technique without contrast. COMPARISON:  Prior CT from 12/19/2015. FINDINGS: MRI HEAD FINDINGS Diffuse prominence of the CSF containing spaces is compatible with generalized cerebral atrophy, moderate nature. Patchy and confluent T2/FLAIR hyperintensity within the periventricular and deep white matter both cerebral hemispheres most consistent with chronic small vessel ischemic disease, also moderate nature. Probable small remote lacunar infarct noted within the left thalamus. No abnormal foci of restricted diffusion to suggest acute or subacute infarct. Gray-white matter differentiation maintained. Major intracranial vascular flow voids are preserved. No acute or chronic intracranial hemorrhage. No mass lesion, midline shift, or mass effect. No hydrocephalus. No extra-axial fluid collection. Major dural sinuses are grossly patent. Craniocervical junction within normal limits. Mild degenerate spondylolysis noted at C3-4 without significant stenosis. Remainder the visualized upper cervical spine unremarkable. Pituitary gland within normal limits. No acute abnormality about the globes and orbits. Patient is status post lens extraction bilaterally. Mild mucosal thickening throughout the paranasal sinuses. No air-fluid level to suggest active sinus infection. Trace opacity within the right mastoid air cells. Inner ear  structures grossly normal. Bone marrow signal intensity within normal limits. No scalp soft tissue abnormality. MRA HEAD FINDINGS ANTERIOR CIRCULATION: Distal cervical segments of the internal carotid arteries are patent with antegrade flow. Petrous, cavernous, and supraclinoid segments widely patent without significant stenosis. Left A1 segment dominant and widely patent. Right A1 segment hypoplastic, all likely accounts for the slightly diminutive right ICA as compared the left. Anterior communicating artery normal. Anterior cerebral arteries well opacified. M1 segments patent without stenosis or occlusion. MCA bifurcations normal. Distal MCA branches well opacified and symmetric. POSTERIOR CIRCULATION: Right vertebral artery dominant and widely patent to the vertebrobasilar junction. Left vertebral artery diffusely hypoplastic but patent as well. Basilar artery mildly tortuous but widely patent to its distal aspect. Superior cerebral arteries patent bilaterally. Both of the posterior cerebral arteries arise from the basilar artery are well opacified to their distal aspects. Small left posterior communicating artery noted. No aneurysm or vascular malformation. IMPRESSION: MRI HEAD IMPRESSION: 1. No acute intracranial infarct or other process identified. 2. 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 MRA HEAD IMPRESSION: Normal intracranial MRA. Electronically Signed   By: Jeannine Boga M.D.   On: 12/19/2015  06:45   Ct Head Code Stroke W/o Cm  Result Date: 12/18/2015 CLINICAL DATA:  Code stroke. Right-sided weakness and numbness beginning today. EXAM: CT HEAD WITHOUT CONTRAST TECHNIQUE: Contiguous axial images were obtained from the base of the skull through the vertex without intravenous contrast. COMPARISON:  None. FINDINGS: There is no evidence of acute cortical infarct, intracranial hemorrhage, mass, midline shift, or extra-axial fluid collection. Ventricles and sulci are  within normal limits for age. 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 Prior bilateral cataract extraction is noted. The visualized paranasal sinuses and mastoid air cells are clear. No acute osseous abnormality is seen. Calcified atherosclerosis is noted at the skullbase. ASPECTS [facility] Stroke Program Early CT Score) - Ganglionic level infarction (caudate, lentiform nuclei, internal capsule, insula, M1-M3 cortex): 7 - Supraganglionic infarction (M4-M6 cortex): 3 Total score (0-10 with 10 being normal): 10 IMPRESSION: 1. No evidence of acute intracranial abnormality. 2. ASPECTS is 10. 3. Extensive chronic small vessel ischemic disease. These results were called by telephone at the time of interpretation on 12/18/2015 at 4:37 pm to Dr. Julianne Rice , who verbally acknowledged these results. Electronically Signed   By: Logan Bores M.D.   On: 12/18/2015 16:37     Scheduled Meds: . aspirin  300 mg Rectal Daily   Or  . aspirin  325 mg Oral Daily  . atorvastatin  20 mg Oral Daily  . enoxaparin (LOVENOX) injection  40 mg Subcutaneous Q24H  . FLUoxetine  20 mg Oral Daily  . insulin aspart  0-15 Units Subcutaneous TID WC   Continuous Infusions: . sodium chloride 50 mL/hr at 12/19/15 0107    Active Problems:   TIA (transient ischemic attack)   Diabetes mellitus, type 2 ([place])   Essential hypertension   Hyperlipidemia   Depression  Time spent:   Irwin Brakeman, MD, FAAFP [facility] Pager [phone]  If 7PM-7AM, please contact night-coverage [email or web address] Password [email or web address] 12/20/2015, 8:26 AM    LOS: 0 days

## 2015-12-20 NOTE — Progress Notes (Signed)
I gave patient a handwritten prescription for outpatient PT with diagnosis code at discharge.  Murvin Natal, MD

## 2015-12-21 LAB — ECHOCARDIOGRAM COMPLETE
Height: 68 in
WEIGHTICAEL: 3670.4 [oz_av]

## 2015-12-24 DIAGNOSIS — I639 Cerebral infarction, unspecified: Secondary | ICD-10-CM | POA: Diagnosis not present

## 2015-12-24 DIAGNOSIS — Z6833 Body mass index (BMI) 33.0-33.9, adult: Secondary | ICD-10-CM | POA: Diagnosis not present

## 2015-12-24 DIAGNOSIS — E6609 Other obesity due to excess calories: Secondary | ICD-10-CM | POA: Diagnosis not present

## 2015-12-24 DIAGNOSIS — E11319 Type 2 diabetes mellitus with unspecified diabetic retinopathy without macular edema: Secondary | ICD-10-CM | POA: Diagnosis not present

## 2015-12-24 DIAGNOSIS — I679 Cerebrovascular disease, unspecified: Secondary | ICD-10-CM | POA: Diagnosis not present

## 2015-12-24 DIAGNOSIS — Z23 Encounter for immunization: Secondary | ICD-10-CM | POA: Diagnosis not present

## 2015-12-24 DIAGNOSIS — Z7984 Long term (current) use of oral hypoglycemic drugs: Secondary | ICD-10-CM | POA: Diagnosis not present

## 2015-12-30 ENCOUNTER — Encounter: Payer: Self-pay | Admitting: Rehabilitation

## 2015-12-30 ENCOUNTER — Ambulatory Visit: Payer: Medicare Other | Attending: Family Medicine | Admitting: Rehabilitation

## 2015-12-30 DIAGNOSIS — R2681 Unsteadiness on feet: Secondary | ICD-10-CM

## 2015-12-30 DIAGNOSIS — R2689 Other abnormalities of gait and mobility: Secondary | ICD-10-CM | POA: Insufficient documentation

## 2015-12-30 DIAGNOSIS — M6281 Muscle weakness (generalized): Secondary | ICD-10-CM | POA: Diagnosis not present

## 2015-12-30 DIAGNOSIS — R42 Dizziness and giddiness: Secondary | ICD-10-CM | POA: Insufficient documentation

## 2015-12-30 NOTE — Patient Instructions (Signed)
   Copyright  VHI. All rights reserved.  Sit to Side-Lying   Sit on edge of bed. Lie down onto the right side (turn head to the Left) and hold until dizziness stops, plus 20 seconds.  Return to sitting and wait until dizziness stops, plus 20 seconds.  Repeat to the left side(turn head to the R) Repeat sequence 5 times per session. Do 2 sessions per day.

## 2015-12-30 NOTE — Therapy (Signed)
[facility] [address] [place], [place], [address] Phone: [phone]   Fax:  [phone]  Physical Therapy Evaluation  Patient Details  Name: Vincent Alvarez MRN: [id number] Date of Birth: [birth year] Referring Provider: Melinda Crutch, MD  Encounter Date: 12/30/2015      PT End of Session - 12/30/15 1047    Visit Number 1   Number of Visits 9   Date for PT Re-Evaluation 01/29/16   Authorization Type MCR-G code every 10th visit   PT Start Time 0930   PT Stop Time 1016   PT Time Calculation (min) 46 min   Activity Tolerance Patient tolerated treatment well   Behavior During Therapy [facility] for tasks assessed/performed      Past Medical History:  Diagnosis Date  . Atypical chest pain   . Depression   . Diabetes mellitus without complication ([place])   . Diabetic retinopathy ([place])   . Gout   . Hearing loss   . Hyperlipidemia   . Hypertension   . Onychomycosis   . UTI (lower urinary tract infection)     Past Surgical History:  Procedure Laterality Date  . CATARACT EXTRACTION, BILATERAL    . CHOLECYSTECTOMY    . COLONOSCOPY    . TONSILLECTOMY      There were no vitals filed for this visit.       Subjective Assessment - 12/30/15 0934    Subjective "The therapist at [facility] recommended this mainly because when they walked me in the hallway, I would fall to the left.  I seemed to get tripped up and stagger, but I catch myself.  I noticed yesterday that I am tripping over the L foot and fall to the L.  If I stand up from chair at home, I tend to do it then too."    Limitations Walking;House hold activities            [facility] PT Assessment - 12/30/15 0001      Assessment   Medical Diagnosis TIA   Referring Provider Melinda Crutch, MD   Onset Date/Surgical Date 12/18/15   Hand Dominance Right   Prior Therapy acute care     Precautions   Precautions Fall     Restrictions   Weight Bearing Restrictions No     Balance Screen   Has the patient fallen in the past 6 months No   Has the patient had a decrease in activity level because of a fear of falling?  Yes   Is the patient reluctant to leave their home because of a fear of falling?  Yes     [place] residence   Living Arrangements Spouse/significant other   Available Help at Discharge Available PRN/intermittently  wife works during the day   Type of [place] to enter   Entrance Stairs-Number of Steps 2   Entrance Stairs-Rails Left  has two back entrances, has a R and [place] One level   [employer] - single point;Shower seat;Grab bars - toilet  doens't use shower seat, uses walk in shower   Additional Comments uses grab bar to get off toilet due to past knee surgery     Prior Function   Level of Independence Independent  using cane intermittently   Vocation Retired   Corporate investment banker, 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), likes to go shopping, used to walk a lot,  likes to drive     Cognition   Overall Cognitive Status Impaired/Different from baseline   Area of Impairment Problem solving;Memory     Sensation   Light Touch Appears Intact   Hot/Cold Appears Intact   Proprioception Appears Intact     Coordination   Gross Motor Movements are Fluid and Coordinated Yes   Fine Motor Movements are Fluid and Coordinated Yes     ROM / Strength   AROM / PROM / Strength Strength     Strength   Overall Strength Deficits   Overall Strength Comments overall 4/5-5/5, except for L hamstrings 3+/5     Transfers   Transfers Sit to Stand;Stand to Sit   Sit to Stand 6: Modified independent (Device/Increase time)   Stand to Sit 6: Modified independent (Device/Increase time)     Ambulation/Gait   Ambulation/Gait Yes   Ambulation/Gait Assistance 6: Modified independent (Device/Increase time);5: Supervision  S with turns, balance challenge   Ambulation  Distance (Feet) 345 Feet   Assistive device None   Gait Pattern Lateral hip instability;Lateral trunk lean to left   Ambulation Surface Level;Indoor   Gait velocity 3.18 ft/sec   Stairs Yes   Stairs Assistance 6: Modified independent (Device/Increase time)   Stair Management Technique One rail Right;Alternating pattern;Forwards   Number of Stairs 4   Height of Stairs 6     Standardized Balance Assessment   Standardized Balance Assessment Dynamic Gait Index     Dynamic Gait Index   Level Surface Normal   Change in Gait Speed Mild Impairment   Gait with Horizontal Head Turns Mild Impairment   Gait with Vertical Head Turns Moderate Impairment   Gait and Pivot Turn Mild Impairment   Step Over Obstacle Mild Impairment   Step Around Obstacles Mild Impairment   Steps Mild Impairment   Total Score 16   DGI comment: Scores under 19 indicative of increased fall risk.             Vestibular Assessment - 12/30/15 0001      Symptom Behavior   Type of Dizziness Spinning   Duration of Dizziness short   Aggravating Factors Supine to sit   Relieving Factors Slow movements     Occulomotor Exam   Occulomotor Alignment Normal   Spontaneous Absent     Positional Testing   Dix-Hallpike Dix-Hallpike Right;Dix-Hallpike Left   Horizontal Canal Testing Horizontal Canal Right;Horizontal Canal Left     Dix-Hallpike Right   Dix-Hallpike Right Symptoms No nystagmus     Dix-Hallpike Left   Dix-Hallpike Left Symptoms No nystagmus     Horizontal Canal Right   Horizontal Canal Right Symptoms Normal     Horizontal Canal Left   Horizontal Canal Left Symptoms Normal                       PT Education - 12/30/15 1046    Education provided Yes   Education Details Education on MaranaBrandt Daroff exercises to decrease vertigo symptoms, see pt instruction    Person(s) Educated Patient   Methods Explanation;Demonstration;Handout   Comprehension Verbalized understanding;Returned  demonstration             PT Long Term Goals - 12/30/15 1055      PT LONG TERM GOAL #1   Title Pt will be independent with HEP in order to indicate improved functional mobility and decreased fall risk.  (Target Date: 01/27/16)   Time 4   Period Weeks   Status  New     PT LONG TERM GOAL #2   Title Pt will improve DGI to >19/24 in order to indicate decreased fall risk.     Time 4   Period Weeks   Status New     PT LONG TERM GOAL #3   Title Pt will ambulate over varying outdoor surfaces x 1000' without AD with no LOB at independent level in order to indicate improved community mobility.      Time 4   Period Weeks   Status New     PT LONG TERM GOAL #4   Title Pt will verbalize understanding of CVA warning signs/risk factors in order to decrease time to seeking medical attention in case of future CVA.     Time 4   Period Weeks   Status New     PT LONG TERM GOAL #5   Title Pt will ambulate x 300' indoors at mod I level while scanning environment in order to indicate no LOB and safe return to community.    Time 4   Period Weeks               Plan - 12/30/15 1049    Clinical Impression Statement Pt presents s/p TIA on 12/18/15 with R sided symtpoms including weakness, decreased sensation and balance deficits.  Pt still with c/o balance deficits, esp when turning to the L and notes that he catches L foot at times.  Note history of DM with peripheral neuropathy and HTN that could impact therapy progress.  Upon PT eval, note gait speed WFL at 3.18 ft/sec, however DGI is 16/24, indicative of increased fall risk.  Also note that he states he has some spinning dizziness with supine to sit, therfore assessed [employer] and roll test with all tests negative but still with some mild spinning during sitting, therefore provided with Nestor Lewandowsky exercises.  Pt will benefit from skilled OP neuro to address deficits.  He is of evolving presentation and mild complexity.     Rehab  Potential Excellent   PT Frequency 2x / week   PT Duration 4 weeks   PT Treatment/Interventions ADLs/Self Care Home Management;Canalith Repostioning;Electrical Stimulation;Gait training;Stair training;Functional mobility training;Therapeutic activities;Therapeutic exercise;Balance training;Neuromuscular re-education;Patient/family education;Vestibular   PT Next Visit Plan Give handout for Nestor Lewandowsky from last session Raquel Sarna forgot!) est HEP for balance, hamstring strength, high level balance with head turns   Consulted and Agree with Plan of Care Patient      Patient will benefit from skilled therapeutic intervention in order to improve the following deficits and impairments:  Abnormal gait, Decreased balance, Decreased activity tolerance, Decreased mobility, Decreased strength, Dizziness, Impaired perceived functional ability, Postural dysfunction  Visit Diagnosis: Unsteadiness on feet - Plan: PT plan of care cert/re-cert  Other abnormalities of gait and mobility - Plan: PT plan of care cert/re-cert  Dizziness and giddiness - Plan: PT plan of care cert/re-cert  Muscle weakness (generalized) - Plan: PT plan of care cert/re-cert      G-Codes - [id number]    Functional Assessment Tool Used DGI: 16/24   Functional Limitation Mobility: Walking and moving around   Mobility: Walking and Moving Around Current Status [phone]) At least 20 percent but less than 40 percent impaired, limited or restricted   Mobility: Walking and Moving Around Goal Status [phone]) At least 1 percent but less than 20 percent impaired, limited or restricted       Problem List Patient Active Problem List   Diagnosis Date  Noted  . TIA (transient ischemic attack) 12/18/2015  . Diabetes mellitus, type 2 ([place]) 12/18/2015  . Essential hypertension 12/18/2015  . Hyperlipidemia 12/18/2015  . Depression 12/18/2015    Cameron Sprang, PT, MPT [facility] [address] [place], [place], [address] Phone: [phone]   Fax:  [phone] 12/30/15, 11:01 AM  Name: JOON CALLISON MRN: [id number] Date of Birth: [birth year]

## 2016-01-03 ENCOUNTER — Ambulatory Visit: Payer: Medicare Other | Admitting: Physical Therapy

## 2016-01-03 ENCOUNTER — Encounter: Payer: Self-pay | Admitting: Physical Therapy

## 2016-01-03 DIAGNOSIS — R2689 Other abnormalities of gait and mobility: Secondary | ICD-10-CM | POA: Diagnosis not present

## 2016-01-03 DIAGNOSIS — R2681 Unsteadiness on feet: Secondary | ICD-10-CM | POA: Diagnosis not present

## 2016-01-03 DIAGNOSIS — R42 Dizziness and giddiness: Secondary | ICD-10-CM | POA: Diagnosis not present

## 2016-01-03 DIAGNOSIS — M6281 Muscle weakness (generalized): Secondary | ICD-10-CM | POA: Diagnosis not present

## 2016-01-03 NOTE — Patient Instructions (Addendum)
Sit to Side-Lying    Sit on edge of bed.  1. Turn head 45 to right.  2. Maintain head position and lie down slowly on left side. Hold until symptoms subside, plus 20 seconds.  3. Sit up slowly. Hold until symptoms subside, plus 20 seconds. 4. Turn head 45 to left.  5. Maintain head position and lie down slowly on right side. Hold until symptoms subside, plus 20 seconds. 6. Sit up slowly hold until symptoms subside, plus 20 seconds. Repeat sequence __3-5_ times each way per session. Do _1-2___ sessions per day.  Copyright  VHI. All rights reserved.    At kitchen counter top for balance with the following exercises/activities:    "I love a Parade" Lift   At counter for balance as needed: high knee marching forward and then backward. 3 second pauses with each knee lift.  Repeat 3 laps each way. Do _1-2_ sessions per day. http://gt2.exer.us/345   Copyright  VHI. All rights reserved.   Walking on Heels   At counter: Walk on heels forward while continuing on a straight path, and then walk on heels backward to starting position. Repeat for 3 laps each way. Do _1-2__ sessions per day.  Copyright  VHI. All rights reserved.  Walking on Toes   At counter for balance as needed: Walk on toes forward while continuing on a straight path, and then backwards on toes to starting position. Repeat 3 laps each way. Do _1-2___ sessions per day.  Copyright  VHI. All rights reserved.  Feet Heel-Toe "Tandem"   At counter: Arms at sides, walk a straight line forward bringing one foot directly in front of the other, and then a straight line backwards bringing one foot directly behind the other one.  Repeat for _3 laps each way. Do _1-2_ sessions per day.  Copyright  VHI. All rights reserved.

## 2016-01-03 NOTE — Therapy (Signed)
[facility] [address] [place], [place], [address] Phone: [phone]   Fax:  [phone]  Physical Therapy Treatment  Patient Details  Name: Vincent Alvarez MRN: [id number] Date of Birth: 1940/05/24 Referring Provider: Melinda Crutch, MD  Encounter Date: 01/03/2016      PT End of Session - 01/03/16 1408    Visit Number 2   Number of Visits 9   Date for PT Re-Evaluation 01/29/16   Authorization Type MCR-G code every 10th visit   PT Start Time [id number]   PT Stop Time 1443   PT Time Calculation (min) 40 min   Equipment Utilized During Treatment Gait belt   Activity Tolerance Patient tolerated treatment well   Behavior During Therapy [facility] for tasks assessed/performed      Past Medical History:  Diagnosis Date  . Atypical chest pain   . Depression   . Diabetes mellitus without complication ([place])   . Diabetic retinopathy ([place])   . Gout   . Hearing loss   . Hyperlipidemia   . Hypertension   . Onychomycosis   . UTI (lower urinary tract infection)     Past Surgical History:  Procedure Laterality Date  . CATARACT EXTRACTION, BILATERAL    . CHOLECYSTECTOMY    . COLONOSCOPY    . TONSILLECTOMY      There were no vitals filed for this visit.      Subjective Assessment - 01/03/16 1407    Subjective No new complaints. No falls, "just stumbles". No pain today. MD has started him on Vitamin B (will bring in dosage next visit).    Limitations Walking;House hold activities   Currently in Pain? No/denies   Pain Score 0-No pain          Vestibular Treatment/Exercise - 01/03/16 1307      Vestibular Treatment/Exercise   Vestibular Treatment Provided Habituation   Habituation Exercises Legrand [place] Daroff   Number of Reps  3   Symptom Description  no dizzines with lying down to either side. mild dizziness with sitting up each time from both sides right x 1 rep, left x 3 reps, decreased in intensity  each rep      Issued the following to pt's HEP:   Sit to Side-Lying    Sit on edge of bed.  1. Turn head 45 to right.  2. Maintain head position and lie down slowly on left side. Hold until symptoms subside, plus 20 seconds.  3. Sit up slowly. Hold until symptoms subside, plus 20 seconds. 4. Turn head 45 to left.  5. Maintain head position and lie down slowly on right side. Hold until symptoms subside, plus 20 seconds. 6. Sit up slowly hold until symptoms subside, plus 20 seconds. Repeat sequence __3-5_ times each way per session. Do _1-2___ sessions per day.  Copyright  VHI. All rights reserved.    At kitchen counter top for balance with the following exercises/activities:    "I love a Parade" Lift   At counter for balance as needed: high knee marching forward and then backward. 3 second pauses with each knee lift.  Repeat 3 laps each way. Do _1-2_ sessions per day. http://gt2.exer.us/345   Copyright  VHI. All rights reserved.   Walking on Heels   At counter: Walk on heels forward while continuing on a straight path, and then walk on heels backward to starting position. Repeat for 3 laps each way. Do _1-2__ sessions per day.  Copyright  VHI. All rights reserved.  Walking on Toes   At counter for balance as needed: Walk on toes forward while continuing on a straight path, and then backwards on toes to starting position. Repeat 3 laps each way. Do _1-2___ sessions per day.  Copyright  VHI. All rights reserved.  Feet Heel-Toe "Tandem"   At counter: Arms at sides, walk a straight line forward bringing one foot directly in front of the other, and then a straight line backwards bringing one foot directly behind the other one.  Repeat for _3 laps each way. Do _1-2_ sessions per day.  Copyright  VHI. All rights reserved.           PT Education - 01/03/16 1440    Education provided Yes   Education Details HEP: for habituation and balance    Person(s)  Educated Patient   Methods Explanation;Demonstration;Verbal cues;Handout   Comprehension Returned demonstration;Verbalized understanding;Verbal cues required;Need further instruction             PT Long Term Goals - 12/30/15 1055      PT LONG TERM GOAL #1   Title Pt will be independent with HEP in order to indicate improved functional mobility and decreased fall risk.  (Target Date: 01/27/16)   Time 4   Period Weeks   Status New     PT LONG TERM GOAL #2   Title Pt will improve DGI to >19/24 in order to indicate decreased fall risk.     Time 4   Period Weeks   Status New     PT LONG TERM GOAL #3   Title Pt will ambulate over varying outdoor surfaces x 1000' without AD with no LOB at independent level in order to indicate improved community mobility.      Time 4   Period Weeks   Status New     PT LONG TERM GOAL #4   Title Pt will verbalize understanding of CVA warning signs/risk factors in order to decrease time to seeking medical attention in case of future CVA.     Time 4   Period Weeks   Status New     PT LONG TERM GOAL #5   Title Pt will ambulate x 300' indoors at mod I level while scanning environment in order to indicate no LOB and safe return to community.    Time 4   Period Weeks            Plan - 01/03/16 1408    Clinical Impression Statement Today's session focused on review of Nestor Lewandowsky for habituation and establishement of HEP for balance. No issues reported with session. Pt is making steady progress toward goals.    Rehab Potential Excellent   PT Frequency 2x / week   PT Duration 4 weeks   PT Treatment/Interventions ADLs/Self Care Home Management;Canalith Repostioning;Electrical Stimulation;Gait training;Stair training;Functional mobility training;Therapeutic activities;Therapeutic exercise;Balance training;Neuromuscular re-education;Patient/family education;Vestibular   PT Next Visit Plan hamstring strength, high level balance with head turns,  dynamic gait/balance, advance HEP as needed.   Consulted and Agree with Plan of Care Patient      Patient will benefit from skilled therapeutic intervention in order to improve the following deficits and impairments:  Abnormal gait, Decreased balance, Decreased activity tolerance, Decreased mobility, Decreased strength, Dizziness, Impaired perceived functional ability, Postural dysfunction  Visit Diagnosis: Dizziness and giddiness  Muscle weakness (generalized)  Other abnormalities of gait and mobility  Unsteadiness on feet     Problem List Patient Active Problem  List   Diagnosis Date Noted  . TIA (transient ischemic attack) 12/18/2015  . Diabetes mellitus, type 2 ([place]) 12/18/2015  . Essential hypertension 12/18/2015  . Hyperlipidemia 12/18/2015  . Depression 12/18/2015    Willow Ora, PTA, [facility] [address] [place], [place] [address] [phone] 01/04/16, 1:07 PM   Name: RABON HAGEE MRN: [id number] Date of Birth: 10/12/1940

## 2016-01-05 ENCOUNTER — Ambulatory Visit: Payer: Medicare Other | Admitting: Physical Therapy

## 2016-01-05 ENCOUNTER — Encounter: Payer: Self-pay | Admitting: Physical Therapy

## 2016-01-05 DIAGNOSIS — R42 Dizziness and giddiness: Secondary | ICD-10-CM | POA: Diagnosis not present

## 2016-01-05 DIAGNOSIS — R2681 Unsteadiness on feet: Secondary | ICD-10-CM

## 2016-01-05 DIAGNOSIS — M6281 Muscle weakness (generalized): Secondary | ICD-10-CM

## 2016-01-05 DIAGNOSIS — R2689 Other abnormalities of gait and mobility: Secondary | ICD-10-CM | POA: Diagnosis not present

## 2016-01-07 NOTE — Therapy (Signed)
[facility] [address] [place], [place], [address] Phone: [phone]   Fax:  [phone]  Physical Therapy Treatment  Patient Details  Name: Vincent Alvarez MRN: [id number] Date of Birth: 1940-08-17 Referring Provider: Melinda Crutch, MD  Encounter Date: 01/05/2016   01/05/16 1023  PT Visits / Re-Eval  Visit Number 3  Number of Visits 9  Date for PT Re-Evaluation 01/29/16  Authorization  Authorization Type MCR-G code every 10th visit  PT Time Calculation  PT Start Time 1018  PT Stop Time 1100  PT Time Calculation (min) 42 min  PT - End of Session  Equipment Utilized During Treatment Gait belt  Activity Tolerance Patient tolerated treatment well  Behavior During Therapy [facility] for tasks assessed/performed     Past Medical History:  Diagnosis Date  . Atypical chest pain   . Depression   . Diabetes mellitus without complication ([place])   . Diabetic retinopathy ([place])   . Gout   . Hearing loss   . Hyperlipidemia   . Hypertension   . Onychomycosis   . UTI (lower urinary tract infection)     Past Surgical History:  Procedure Laterality Date  . CATARACT EXTRACTION, BILATERAL    . CHOLECYSTECTOMY    . COLONOSCOPY    . TONSILLECTOMY      There were no vitals filed for this visit.     01/05/16 1023  Symptoms/Limitations  Subjective No new complaints. Still with dizziness at times. Reports HEP is going well.  Limitations Walking;House hold activities  Pain Assessment  Currently in Pain? No/denies  Pain Score 0      01/05/16 1034  Ambulation/Gait  Ambulation/Gait Yes  Ambulation/Gait Assistance 4: Min guard;4: Min assist  Ambulation/Gait Assistance Details along 50 foot hallway with min guard assist to min assist for balance: forward gait wtih head turns left<>right and nods up<>down x 4 laps each.  Assistive device None  Gait Pattern Step-through pattern;Lateral hip instability;Lateral trunk lean to  left  Ambulation Surface Level;Indoor       01/05/16 1042  Balance Exercises: Standing  Rockerboard Anterior/posterior;Lateral;Head turns;EO;EC;30 seconds;10 reps;Intermittent UE support  Balance Beam standing with feet across blue foam beam: alternating forward stepping to floor and back onto foam x 10 each side, alternating backward stepping to floor and back onto foam x 10 reps with light UE support and min to min guard assist for balance.  Balance Exercises: Standing  Rebounder Limitations performed both ways on balance board with no UE support: EO rocking board with emphasis on tall posture, EC holding board steady with no head movements progressing to head movements up<>down and left<>right.           PT Long Term Goals - 12/30/15 1055      PT LONG TERM GOAL #1   Title Pt will be independent with HEP in order to indicate improved functional mobility and decreased fall risk.  (Target Date: 01/27/16)   Time 4   Period Weeks   Status New     PT LONG TERM GOAL #2   Title Pt will improve DGI to >19/24 in order to indicate decreased fall risk.     Time 4   Period Weeks   Status New     PT LONG TERM GOAL #3   Title Pt will ambulate over varying outdoor surfaces x 1000' without AD with no LOB at independent level in order to indicate improved community mobility.      Time 4  Period Weeks   Status New     PT LONG TERM GOAL #4   Title Pt will verbalize understanding of CVA warning signs/risk factors in order to decrease time to seeking medical attention in case of future CVA.     Time 4   Period Weeks   Status New     PT LONG TERM GOAL #5   Title Pt will ambulate x 300' indoors at mod I level while scanning environment in order to indicate no LOB and safe return to community.    Time 4   Period Weeks        01/05/16 1023  Plan  Clinical Impression Statement Today's session focused on high level balance and dynamic gait activities with out any issues reported. Pt is  making steady progress toward goals and should benefit from continued PT to progress toward unmet goals.   Pt will benefit from skilled therapeutic intervention in order to improve on the following deficits Abnormal gait;Decreased balance;Decreased activity tolerance;Decreased mobility;Decreased strength;Dizziness;Impaired perceived functional ability;Postural dysfunction  Rehab Potential Excellent  PT Frequency 2x / week  PT Duration 4 weeks  PT Treatment/Interventions ADLs/Self Care Home Management;Canalith Repostioning;Electrical Stimulation;Gait training;Stair training;Functional mobility training;Therapeutic activities;Therapeutic exercise;Balance training;Neuromuscular re-education;Patient/family education;Vestibular  PT Next Visit Plan hamstring strength, high level balance with head turns, dynamic gait/balance, advance HEP as needed.  Consulted and Agree with Plan of Care Patient       Patient will benefit from skilled therapeutic intervention in order to improve the following deficits and impairments:  Abnormal gait, Decreased balance, Decreased activity tolerance, Decreased mobility, Decreased strength, Dizziness, Impaired perceived functional ability, Postural dysfunction  Visit Diagnosis: Dizziness and giddiness  Muscle weakness (generalized)  Other abnormalities of gait and mobility  Unsteadiness on feet     Problem List Patient Active Problem List   Diagnosis Date Noted  . TIA (transient ischemic attack) 12/18/2015  . Diabetes mellitus, type 2 ([place]) 12/18/2015  . Essential hypertension 12/18/2015  . Hyperlipidemia 12/18/2015  . Depression 12/18/2015    Willow Ora, PTA, [facility] [address] [place], [place] [address] [phone] 01/07/16, 8:32 AM   Name: Vincent Alvarez MRN: [id number] Date of Birth: 05/11/1940

## 2016-01-12 ENCOUNTER — Encounter: Payer: Self-pay | Admitting: Physical Therapy

## 2016-01-12 ENCOUNTER — Ambulatory Visit: Payer: Medicare Other | Admitting: Physical Therapy

## 2016-01-12 DIAGNOSIS — M6281 Muscle weakness (generalized): Secondary | ICD-10-CM

## 2016-01-12 DIAGNOSIS — R2689 Other abnormalities of gait and mobility: Secondary | ICD-10-CM | POA: Diagnosis not present

## 2016-01-12 DIAGNOSIS — R2681 Unsteadiness on feet: Secondary | ICD-10-CM | POA: Diagnosis not present

## 2016-01-12 DIAGNOSIS — R42 Dizziness and giddiness: Secondary | ICD-10-CM

## 2016-01-12 NOTE — Therapy (Signed)
[facility] [address] [place], [place], [address] Phone: [phone]   Fax:  [phone]  Physical Therapy Treatment  Patient Details  Name: Vincent Alvarez MRN: [id number] Date of Birth: April 25, 1940 Referring Provider: Melinda Crutch, MD  Encounter Date: 01/12/2016      PT End of Session - 01/12/16 0856    Visit Number 4   Number of Visits 9   Date for PT Re-Evaluation 01/29/16   Authorization Type MCR-G code every 10th visit   PT Start Time 0850   PT Stop Time 0930   PT Time Calculation (min) 40 min   Equipment Utilized During Treatment Gait belt   Activity Tolerance Patient tolerated treatment well   Behavior During Therapy [facility] for tasks assessed/performed      Past Medical History:  Diagnosis Date  . Atypical chest pain   . Depression   . Diabetes mellitus without complication ([place])   . Diabetic retinopathy ([place])   . Gout   . Hearing loss   . Hyperlipidemia   . Hypertension   . Onychomycosis   . UTI (lower urinary tract infection)     Past Surgical History:  Procedure Laterality Date  . CATARACT EXTRACTION, BILATERAL    . CHOLECYSTECTOMY    . COLONOSCOPY    . TONSILLECTOMY      There were no vitals filed for this visit.      Subjective Assessment - 01/12/16 0856    Subjective No new complaints. No falls or pain to report. Dizziness "seams to be better, however it does occur at times".    Limitations Walking;House hold activities   Currently in Pain? No/denies   Pain Score 0-No pain              [facility] Adult PT Treatment/Exercise - 01/12/16 0857      Ambulation/Gait   Ambulation/Gait Yes   Ambulation/Gait Assistance 4: Min guard;5: Supervision   Ambulation/Gait Assistance Details cues on posture, for increased step length. occasional foot scuffing noted on pavement.   Ambulation Distance (Feet) 500 Feet   Assistive device None   Gait Pattern Step-through pattern;Decreased stride  length   Ambulation Surface Level;Unlevel;Indoor;Outdoor;Paved     High Level Balance   High Level Balance Activities Marching forwards;Marching backwards;Tandem walking  tandem fwd/bwd, heel walking fwd/bwd, toe walking fwd/bwd   High Level Balance Comments on red mats: 3 laps each with min to mod assist for balance. cues on posture, for a more normalized base of support and weight shifting for balance.                                  Balance Exercises - 01/12/16 0924      Balance Exercises: Standing   Standing Eyes Closed Wide (BOA);Foam/compliant surface;3 reps;30 secs;Limitations   SLS with Vectors Foam/compliant surface;Other reps (comment);Limitations   Balance Beam standing with feet across blue foam beam: alternating forward stepping to floor and back onto foam x 10 each side, alternating backward stepping to floor and back onto foam x 10 reps with light UE support and min to min guard assist for balance.     Balance Exercises: Standing   Standing Eyes Closed Limitations standing with feet across blue foam beam: EC for 30 seconds x 3 reps wiht min to mod assist, cues on posture and weight shifting.    SLS with Vectors Limitations tall cones on blue mat: alternating fwd  toe taps, cross toe taps x 10 reps each bil legs with min to mod assist for balance                PT Long Term Goals - 12/30/15 1055      PT LONG TERM GOAL #1   Title Pt will be independent with HEP in order to indicate improved functional mobility and decreased fall risk.  (Target Date: 01/27/16)   Time 4   Period Weeks   Status New     PT LONG TERM GOAL #2   Title Pt will improve DGI to >19/24 in order to indicate decreased fall risk.     Time 4   Period Weeks   Status New     PT LONG TERM GOAL #3   Title Pt will ambulate over varying outdoor surfaces x 1000' without AD with no LOB at independent level in order to indicate improved community mobility.      Time 4   Period Weeks   Status  New     PT LONG TERM GOAL #4   Title Pt will verbalize understanding of CVA warning signs/risk factors in order to decrease time to seeking medical attention in case of future CVA.     Time 4   Period Weeks   Status New     PT LONG TERM GOAL #5   Title Pt will ambulate x 300' indoors at mod I level while scanning environment in order to indicate no LOB and safe return to community.    Time 4   Period Weeks               Plan - 01/12/16 [id number]    Clinical Impression Statement Today's session continued to focus on high level balance activites with pt needing increased assistance on complaint surfaces and with single leg stance activities for balance. Pt is making steady progress toward goals.   Rehab Potential Excellent   PT Frequency 2x / week   PT Duration 4 weeks   PT Treatment/Interventions ADLs/Self Care Home Management;Canalith Repostioning;Electrical Stimulation;Gait training;Stair training;Functional mobility training;Therapeutic activities;Therapeutic exercise;Balance training;Neuromuscular re-education;Patient/family education;Vestibular   PT Next Visit Plan hamstring strength, high level balance with head turns, dynamic gait/balance, advance HEP as needed.   Consulted and Agree with Plan of Care Patient      Patient will benefit from skilled therapeutic intervention in order to improve the following deficits and impairments:  Abnormal gait, Decreased balance, Decreased activity tolerance, Decreased mobility, Decreased strength, Dizziness, Impaired perceived functional ability, Postural dysfunction  Visit Diagnosis: Dizziness and giddiness  Muscle weakness (generalized)  Other abnormalities of gait and mobility  Unsteadiness on feet     Problem List Patient Active Problem List   Diagnosis Date Noted  . TIA (transient ischemic attack) 12/18/2015  . Diabetes mellitus, type 2 ([place]) 12/18/2015  . Essential hypertension 12/18/2015  . Hyperlipidemia 12/18/2015  .  Depression 12/18/2015    Willow Ora, PTA, [facility] [address], [place], [place] [address] [phone] 01/13/16, 12:24 PM   Name: Vincent Alvarez MRN: [id number] Date of Birth: 1941/01/25

## 2016-01-14 ENCOUNTER — Ambulatory Visit: Payer: Medicare Other | Admitting: Physical Therapy

## 2016-01-14 ENCOUNTER — Encounter: Payer: Self-pay | Admitting: Physical Therapy

## 2016-01-14 DIAGNOSIS — R2681 Unsteadiness on feet: Secondary | ICD-10-CM

## 2016-01-14 DIAGNOSIS — M6281 Muscle weakness (generalized): Secondary | ICD-10-CM | POA: Diagnosis not present

## 2016-01-14 DIAGNOSIS — R42 Dizziness and giddiness: Secondary | ICD-10-CM

## 2016-01-14 DIAGNOSIS — R2689 Other abnormalities of gait and mobility: Secondary | ICD-10-CM

## 2016-01-14 NOTE — Therapy (Signed)
[facility] [address] [place], [place], [address] Phone: [phone]   Fax:  [phone]  Physical Therapy Treatment  Patient Details  Name: Vincent Alvarez MRN: [id number] Date of Birth: 17-May-1940 Referring Provider: Melinda Crutch, MD  Encounter Date: 01/14/2016      PT End of Session - 01/14/16 0853    Visit Number 5   Number of Visits 9   Date for PT Re-Evaluation 01/29/16   Authorization Type MCR-G code every 10th visit   PT Start Time 0849   PT Stop Time 0930   PT Time Calculation (min) 41 min   Equipment Utilized During Treatment Gait belt   Activity Tolerance Patient tolerated treatment well   Behavior During Therapy [facility] for tasks assessed/performed      Past Medical History:  Diagnosis Date  . Atypical chest pain   . Depression   . Diabetes mellitus without complication ([place])   . Diabetic retinopathy ([place])   . Gout   . Hearing loss   . Hyperlipidemia   . Hypertension   . Onychomycosis   . UTI (lower urinary tract infection)     Past Surgical History:  Procedure Laterality Date  . CATARACT EXTRACTION, BILATERAL    . CHOLECYSTECTOMY    . COLONOSCOPY    . TONSILLECTOMY      There were no vitals filed for this visit.      Subjective Assessment - 01/14/16 0851    Subjective No new complaints. No falls or pain to report. Does report having a big tripping incident going into house when hands were full. Caught toes on threshold. Caught self with left hand that was empty. Dizziness "seams to be better, however it does occur at times", mostly when he is tired in pm around 3-4 and gets better after he rests.    Limitations Walking;House hold activities   Currently in Pain? No/denies              [facility] Adult PT Treatment/Exercise - 01/14/16 0853      Transfers   Transfers Sit to Stand;Stand to Sit   Sit to Stand 6: Modified independent (Device/Increase time)   Stand to Sit 6: Modified  independent (Device/Increase time)     Ambulation/Gait   Ambulation/Gait Yes   Ambulation/Gait Assistance 5: Supervision;6: Modified independent (Device/Increase time)   Ambulation/Gait Assistance Details intially cues provided for posture, no balance loss noted, no toe scuffing noted   Ambulation Distance (Feet) 1000 Feet   Assistive device None   Gait Pattern Within Functional Limits   Ambulation Surface Level;Unlevel;Indoor;Outdoor;Paved   Gait Comments in addtion to above: gait along ~50 foot hallway with head movements up<>down and left<>right x 4 laps each way, min guard assist. minor veering and decreased gait speed      High Level Balance   High Level Balance Activities Marching forwards;Marching backwards;Tandem walking  tandem fwd/bwd, heel walking fwd/bwd, toe walking fwd/bwd   High Level Balance Comments on red/blue mat combined: 3 laps each fwdd/bwd with min to mod assist for balance and cues for posture to decreaese posterior lean (mostly with bwd aspects of activity), to increase base of support for more normalized base of support and for weight shifting for improved stance control/balance.  PT Education - 01/14/16 0919    Education provided Yes   Education Details CVA education provided   Person(s) Educated Patient   Methods Explanation;Demonstration;Handout   Comprehension Verbalized understanding;Returned demonstration             PT Long Term Goals - 12/30/15 1055      PT LONG TERM GOAL #1   Title Pt will be independent with HEP in order to indicate improved functional mobility and decreased fall risk.  (Target Date: 01/27/16)   Time 4   Period Weeks   Status New     PT LONG TERM GOAL #2   Title Pt will improve DGI to >19/24 in order to indicate decreased fall risk.     Time 4   Period Weeks   Status New     PT LONG TERM GOAL #3   Title Pt will ambulate over varying outdoor surfaces x 1000' without AD  with no LOB at independent level in order to indicate improved community mobility.      Time 4   Period Weeks   Status New     PT LONG TERM GOAL #4   Title Pt will verbalize understanding of CVA warning signs/risk factors in order to decrease time to seeking medical attention in case of future CVA.     Time 4   Period Weeks   Status New     PT LONG TERM GOAL #5   Title Pt will ambulate x 300' indoors at mod I level while scanning environment in order to indicate no LOB and safe return to community.    Time 4   Period Weeks               Plan - 01/14/16 [id number]    Clinical Impression Statement Today's session continued to address gait and high level balance activities. Pt continues to be most challenged with balance activites, no issues with gait on outdoor surfaces today. Also provided pt with CVA education today. Pt is making steady progress toward goals and should benefit from continued PT to progress toward unmet goals.   Rehab Potential Excellent   PT Frequency 2x / week   PT Duration 4 weeks   PT Treatment/Interventions ADLs/Self Care Home Management;Canalith Repostioning;Electrical Stimulation;Gait training;Stair training;Functional mobility training;Therapeutic activities;Therapeutic exercise;Balance training;Neuromuscular re-education;Patient/family education;Vestibular   PT Next Visit Plan hamstring strength, high level balance with head turns, dynamic gait/balance, advance HEP as needed.   Consulted and Agree with Plan of Care Patient      Patient will benefit from skilled therapeutic intervention in order to improve the following deficits and impairments:  Abnormal gait, Decreased balance, Decreased activity tolerance, Decreased mobility, Decreased strength, Dizziness, Impaired perceived functional ability, Postural dysfunction  Visit Diagnosis: Dizziness and giddiness  Muscle weakness (generalized)  Other abnormalities of gait and mobility  Unsteadiness on  feet     Problem List Patient Active Problem List   Diagnosis Date Noted  . TIA (transient ischemic attack) 12/18/2015  . Diabetes mellitus, type 2 ([place]) 12/18/2015  . Essential hypertension 12/18/2015  . Hyperlipidemia 12/18/2015  . Depression 12/18/2015    Willow Ora, PTA, [facility] [address], [place], [place] [address] [phone] 01/14/16, 9:34 AM   Name: KAYSEAN LITTLETON MRN: [id number] Date of Birth: Jun 02, 1940

## 2016-01-14 NOTE — Patient Instructions (Addendum)
Stroke Prevention °Some health problems and behaviors may make it more likely for you to have a stroke. Below are ways to lessen your risk of having a stroke.  °· Be active for at least 30 minutes on most or all days. °· Do not smoke. Try not to be around others who smoke. °· Do not drink too much alcohol. °¨ Do not have more than 2 drinks a day if you are a man. °¨ Do not have more than 1 drink a day if you are a woman and are not pregnant. °· Eat healthy foods, such as fruits and vegetables. If you were put on a specific diet, follow the diet as told. °· Keep your cholesterol levels under control through diet and medicines. Look for foods that are low in saturated fat, trans fat, cholesterol, and are high in fiber. °· If you have diabetes, follow all diet plans and take your medicine as told. °· Ask your doctor if you need treatment to lower your blood pressure. If you have high blood pressure (hypertension), follow all diet plans and take your medicine as told by your doctor. °· If you are 18-39 years old, have your blood pressure checked every 3-5 years. If you are age 40 or older, have your blood pressure checked every year. °· Keep a healthy weight. Eat foods that are low in calories, salt, saturated fat, trans fat, and cholesterol. °· Do not take drugs. °· Avoid birth control pills, if this applies. Talk to your doctor about the risks of taking birth control pills. °· Talk to your doctor if you have sleep problems (sleep apnea). °· Take all medicine as told by your doctor. °¨ You may be told to take aspirin or blood thinner medicine. Take this medicine as told by your doctor. °¨ Understand your medicine instructions. °· Make sure any other conditions you have are being taken care of. °GET HELP RIGHT AWAY IF: °· You suddenly lose feeling (you feel numb) or have weakness in your face, arm, or leg. °· Your face or eyelid hangs down to one side. °· You suddenly feel confused. °· You have trouble talking (aphasia)  or understanding what people are saying. °· You suddenly have trouble seeing in one or both eyes. °· You suddenly have trouble walking. °· You are dizzy. °· You lose your balance or your movements are clumsy (uncoordinated). °· You suddenly have a very bad headache and you do not know the cause. °· You have new chest pain. °· Your heart feels like it is fluttering or skipping a beat (irregular heartbeat). °Do not wait to see if the symptoms above go away. Get help right away. Call your local emergency services (911 in [place] Do not drive yourself to the hospital. °  °This information is not intended to replace advice given to you by your health care provider. Make sure you discuss any questions you have with your health care provider. °  °Document Released: 10/03/2011 Document Revised: 04/24/2014 Document Reviewed: 10/04/2012 °Elsevier Interactive Patient Education ©2016 [employer] ° °

## 2016-01-17 ENCOUNTER — Ambulatory Visit: Payer: Medicare Other | Attending: Family Medicine | Admitting: Physical Therapy

## 2016-01-17 ENCOUNTER — Encounter: Payer: Self-pay | Admitting: Physical Therapy

## 2016-01-17 DIAGNOSIS — R2689 Other abnormalities of gait and mobility: Secondary | ICD-10-CM

## 2016-01-17 DIAGNOSIS — H524 Presbyopia: Secondary | ICD-10-CM | POA: Diagnosis not present

## 2016-01-17 DIAGNOSIS — M6281 Muscle weakness (generalized): Secondary | ICD-10-CM | POA: Insufficient documentation

## 2016-01-17 DIAGNOSIS — Z961 Presence of intraocular lens: Secondary | ICD-10-CM | POA: Diagnosis not present

## 2016-01-17 DIAGNOSIS — R42 Dizziness and giddiness: Secondary | ICD-10-CM | POA: Diagnosis not present

## 2016-01-17 DIAGNOSIS — R2681 Unsteadiness on feet: Secondary | ICD-10-CM | POA: Insufficient documentation

## 2016-01-17 DIAGNOSIS — E113291 Type 2 diabetes mellitus with mild nonproliferative diabetic retinopathy without macular edema, right eye: Secondary | ICD-10-CM | POA: Diagnosis not present

## 2016-01-17 NOTE — Therapy (Signed)
[facility] [address] [place], [place], [address] Phone: [phone]   Fax:  [phone]  Physical Therapy Treatment  Patient Details  Name: MARLEN STEWARD MRN: [id number] Date of Birth: 09/27/1940 Referring Provider: Melinda Crutch, MD  Encounter Date: 01/17/2016      PT End of Session - 01/17/16 0807    Visit Number 6   Number of Visits 9   Date for PT Re-Evaluation 01/29/16   Authorization Type MCR-G code every 10th visit   PT Start Time 0804   PT Stop Time 0845   PT Time Calculation (min) 41 min   Equipment Utilized During Treatment Gait belt   Activity Tolerance Patient tolerated treatment well   Behavior During Therapy [facility] for tasks assessed/performed      Past Medical History:  Diagnosis Date  . Atypical chest pain   . Depression   . Diabetes mellitus without complication ([place])   . Diabetic retinopathy ([place])   . Gout   . Hearing loss   . Hyperlipidemia   . Hypertension   . Onychomycosis   . UTI (lower urinary tract infection)     Past Surgical History:  Procedure Laterality Date  . CATARACT EXTRACTION, BILATERAL    . CHOLECYSTECTOMY    . COLONOSCOPY    . TONSILLECTOMY      There were no vitals filed for this visit.      Subjective Assessment - 01/17/16 0806    Subjective No new complaints. No falls or pain to report.    Limitations Walking;House hold activities   Currently in Pain? No/denies   Pain Score 0-No pain             [facility] Adult PT Treatment/Exercise - 01/17/16 0808      Transfers   Transfers Sit to Stand;Stand to Sit   Sit to Stand 6: Modified independent (Device/Increase time)   Stand to Sit 6: Modified independent (Device/Increase time)     Ambulation/Gait   Ambulation/Gait Yes   Ambulation/Gait Assistance 5: Supervision;6: Modified independent (Device/Increase time)   Ambulation/Gait Assistance Details occasional cues on posture and for increased step/stride  length, occasional toe scuffing, noted on uneven paved surfaces. had pt scan enviroment while walking a at faster pace set by PTA, min guard assist with this.                     Ambulation Distance (Feet) 1000 Feet   Assistive device None   Gait Pattern Within Functional Limits   Ambulation Surface Level;Unlevel;Indoor;Outdoor;Paved     High Level Balance   High Level Balance Activities Marching forwards;Marching backwards;Tandem walking  tandem fwd/bwd, toe walking fwd/bwd, heel walking fwd/bwd   High Level Balance Comments on both red mats: 3 laps each with min guard to min assist for balance, cues on base of support and weight shifting to assist with balance.     Neuro Re-ed    Neuro Re-ed Details  standing on inverted BOSU ball with intermittent UE support on bars for balance: rocking with EC and emphasis on tall posture both fwd/bwd and laterally; mini squats x 10 reps; holding balance/BOSU steady with EC for 5-10 sec's x 3 reps with up to mod assist needed for balance with this activity.             Balance Exercises - 01/17/16 0834      Balance Exercises: Standing   SLS with Vectors Foam/compliant surface;Other reps (comment);Limitations   Step Over Hurdles /  Cones hurdles of varied heights over red mats: reciprocal stepping over hurdles x 6 laps forwards with min guard to min assist for balance                                 Balance Exercises: Standing   SLS with Vectors Limitations 6 cones along the edge of red mats: alternating toe taps to each with side stepping left<>right, alternating double toe taps to each with side stepping left<>right, min guard to min assist for balance. cues on posture and weight shifting to assist with balance.            PT Long Term Goals - 12/30/15 1055      PT LONG TERM GOAL #1   Title Pt will be independent with HEP in order to indicate improved functional mobility and decreased fall risk.  (Target Date: 01/27/16)   Time 4   Period  Weeks   Status New     PT LONG TERM GOAL #2   Title Pt will improve DGI to >19/24 in order to indicate decreased fall risk.     Time 4   Period Weeks   Status New     PT LONG TERM GOAL #3   Title Pt will ambulate over varying outdoor surfaces x 1000' without AD with no LOB at independent level in order to indicate improved community mobility.      Time 4   Period Weeks   Status New     PT LONG TERM GOAL #4   Title Pt will verbalize understanding of CVA warning signs/risk factors in order to decrease time to seeking medical attention in case of future CVA.     Time 4   Period Weeks   Status New     PT LONG TERM GOAL #5   Title Pt will ambulate x 300' indoors at mod I level while scanning environment in order to indicate no LOB and safe return to community.    Time 4   Period Weeks            Plan - 01/17/16 [id number]    Clinical Impression Statement today's session continued to address high level balance and dynamic gait. Pt continues to be most challenged on complaint surfaces and when he needs to use increased vestibular system imput for balance. Pt is making steady progress toward goals.    Rehab Potential Excellent   PT Frequency 2x / week   PT Duration 4 weeks   PT Treatment/Interventions ADLs/Self Care Home Management;Canalith Repostioning;Electrical Stimulation;Gait training;Stair training;Functional mobility training;Therapeutic activities;Therapeutic exercise;Balance training;Neuromuscular re-education;Patient/family education;Vestibular   PT Next Visit Plan hamstring strength, high level balance with head turns, dynamic gait/balance working on getting increased vestibular system imput, advance HEP as needed.   Consulted and Agree with Plan of Care Patient      Patient will benefit from skilled therapeutic intervention in order to improve the following deficits and impairments:  Abnormal gait, Decreased balance, Decreased activity tolerance, Decreased mobility, Decreased  strength, Dizziness, Impaired perceived functional ability, Postural dysfunction  Visit Diagnosis: Dizziness and giddiness  Muscle weakness (generalized)  Other abnormalities of gait and mobility  Unsteadiness on feet     Problem List Patient Active Problem List   Diagnosis Date Noted  . TIA (transient ischemic attack) 12/18/2015  . Diabetes mellitus, type 2 ([place]) 12/18/2015  . Essential hypertension 12/18/2015  . Hyperlipidemia 12/18/2015  . Depression 12/18/2015  Willow Ora, PTA, [facility] [address], [place], [place] [address] [phone] 01/17/16, 8:49 AM   Name: GRAYLEN POLLETT MRN: [id number] Date of Birth: 09/08/1940

## 2016-01-21 ENCOUNTER — Encounter: Payer: Self-pay | Admitting: Rehabilitation

## 2016-01-21 ENCOUNTER — Ambulatory Visit: Payer: Medicare Other | Admitting: Rehabilitation

## 2016-01-21 DIAGNOSIS — R2689 Other abnormalities of gait and mobility: Secondary | ICD-10-CM

## 2016-01-21 DIAGNOSIS — R2681 Unsteadiness on feet: Secondary | ICD-10-CM

## 2016-01-21 DIAGNOSIS — M6281 Muscle weakness (generalized): Secondary | ICD-10-CM | POA: Diagnosis not present

## 2016-01-21 DIAGNOSIS — R42 Dizziness and giddiness: Secondary | ICD-10-CM | POA: Diagnosis not present

## 2016-01-21 NOTE — Therapy (Signed)
[facility] [address] [place], [place], [address] Phone: [phone]   Fax:  [phone]  Physical Therapy Treatment  Patient Details  Name: Vincent Alvarez MRN: [id number] Date of Birth: [birth year] Referring Provider: Melinda Crutch, MD  Encounter Date: 01/21/2016      PT End of Session - 01/21/16 0853    Visit Number 7   Number of Visits 9   Date for PT Re-Evaluation 01/29/16   Authorization Type MCR-G code every 10th visit   Equipment Utilized During Treatment Gait belt   Activity Tolerance Patient tolerated treatment well   Behavior During Therapy [facility] for tasks assessed/performed      Past Medical History:  Diagnosis Date  . Atypical chest pain   . Depression   . Diabetes mellitus without complication ([place])   . Diabetic retinopathy ([place])   . Gout   . Hearing loss   . Hyperlipidemia   . Hypertension   . Onychomycosis   . UTI (lower urinary tract infection)     Past Surgical History:  Procedure Laterality Date  . CATARACT EXTRACTION, BILATERAL    . CHOLECYSTECTOMY    . COLONOSCOPY    . TONSILLECTOMY      There were no vitals filed for this visit.      Subjective Assessment - 01/21/16 0852    Subjective No complaints other than stiff neck.     Limitations Walking;House hold activities   Currently in Pain? No/denies               NMR:  Corner balance tasks, see pt instruction.  Balance in // bars; standing on small rocker board vertically biased maintaining balance x 3 reps of 30 secs, maintaining balance with EO and head turns up/down x 10 reps x 2 sets (did not do side to side due to pain in neck).  Standing on foam bean in tandem (alternating LEs) x 2 sets holding x 30 secs.  Sit<>stand x 10 reps while standing on foam balance beam with 5 sec holds while in standing.  Initially requires min/guard progressing to S.  Walking along counter top performing head turns up/down x 3 reps forwards  and backwards, gait along counter top walking tandem forwards/backwards.  Pt with increased difficulty with head turns from coordination standpoint, but did note increased imbalance during task.    Self Care:  Briefly went over CVA warning signs and risk factors to address LTG.  Pt able to verbalized understanding during visit.                   PT Education - 01/21/16 0853    Education provided Yes   Person(s) Educated Patient   Methods Explanation   Comprehension Verbalized understanding             PT Long Term Goals - 01/21/16 0855      PT LONG TERM GOAL #1   Title Pt will be independent with HEP in order to indicate improved functional mobility and decreased fall risk.  (Target Date: 01/27/16)   Time 4   Period Weeks   Status New     PT LONG TERM GOAL #2   Title Pt will improve DGI to >19/24 in order to indicate decreased fall risk.     Time 4   Period Weeks   Status New     PT LONG TERM GOAL #3   Title Pt will ambulate over varying outdoor surfaces x 1000' without AD with  no LOB at independent level in order to indicate improved community mobility.      Time 4   Period Weeks   Status New     PT LONG TERM GOAL #4   Title Pt will verbalize understanding of CVA warning signs/risk factors in order to decrease time to seeking medical attention in case of future CVA.     Baseline met 01/21/16   Time 4   Period Weeks   Status Achieved     PT LONG TERM GOAL #5   Title Pt will ambulate x 300' indoors at mod I level while scanning environment in order to indicate no LOB and safe return to community.    Time 4   Period Weeks               Plan - 01/21/16 7209    Rehab Potential Excellent   PT Frequency 2x / week   PT Duration 4 weeks   PT Treatment/Interventions ADLs/Self Care Home Management;Canalith Repostioning;Electrical Stimulation;Gait training;Stair training;Functional mobility training;Therapeutic activities;Therapeutic exercise;Balance  training;Neuromuscular re-education;Patient/family education;Vestibular   PT Next Visit Plan hamstring strength, high level balance with head turns, dynamic gait/balance working on getting increased vestibular system imput, advance HEP as needed.   Consulted and Agree with Plan of Care Patient      Patient will benefit from skilled therapeutic intervention in order to improve the following deficits and impairments:  Abnormal gait, Decreased balance, Decreased activity tolerance, Decreased mobility, Decreased strength, Dizziness, Impaired perceived functional ability, Postural dysfunction  Visit Diagnosis: No diagnosis found.     Problem List Patient Active Problem List   Diagnosis Date Noted  . TIA (transient ischemic attack) 12/18/2015  . Diabetes mellitus, type 2 ([place]) 12/18/2015  . Essential hypertension 12/18/2015  . Hyperlipidemia 12/18/2015  . Depression 12/18/2015    Denice Bors 01/21/2016, 9:24 AM  [facility] [address] [place], [place], [address] Phone: [phone]   Fax:  [phone]  Name: Vincent Alvarez MRN: [id number] Date of Birth: August 02, 1940

## 2016-01-21 NOTE — Patient Instructions (Signed)
Feet Apart (Compliant Surface) Head Motion - Eyes Closed    Stand on compliant surface: ___pillow or cushion_____ with feet shoulder width apart. Close eyes and move head slowly, up and down x 10 reps, side to side (when neck feeling better) x 10 reps, and diagonally (up right, down left x 10 and up left, down right x 10 reps) Repeat __1__ times per session. Do _2___ sessions per day.  Copyright  VHI. All rights reserved.   Feet Together (Compliant Surface) Arm Motion - Eyes Closed    Stand on compliant surface: ___pillow or cushion_____ with feet together. Close eyes and keep your arms by your side.  Repeat __3__ times per session for 30 seconds each. Do __2__ sessions per day.  Copyright  VHI. All rights reserved.

## 2016-01-26 ENCOUNTER — Encounter: Payer: Self-pay | Admitting: Physical Therapy

## 2016-01-26 ENCOUNTER — Ambulatory Visit: Payer: Medicare Other | Admitting: Physical Therapy

## 2016-01-26 DIAGNOSIS — R2681 Unsteadiness on feet: Secondary | ICD-10-CM

## 2016-01-26 DIAGNOSIS — R42 Dizziness and giddiness: Secondary | ICD-10-CM | POA: Diagnosis not present

## 2016-01-26 DIAGNOSIS — M6281 Muscle weakness (generalized): Secondary | ICD-10-CM | POA: Diagnosis not present

## 2016-01-26 DIAGNOSIS — R2689 Other abnormalities of gait and mobility: Secondary | ICD-10-CM

## 2016-01-26 NOTE — Therapy (Signed)
[facility] [address] [place], [place], [address] Phone: [phone]   Fax:  [phone]  Physical Therapy Treatment  Patient Details  Name: Vincent Alvarez MRN: [id number] Date of Birth: 05/01/1940 Referring Provider: Melinda Crutch, MD  Encounter Date: 01/26/2016      PT End of Session - 01/26/16 0939    Visit Number 8   Number of Visits 9   Date for PT Re-Evaluation 01/29/16   Authorization Type MCR-G code every 10th visit   PT Start Time 0934   PT Stop Time 1015   PT Time Calculation (min) 41 min   Equipment Utilized During Treatment Gait belt   Activity Tolerance Patient tolerated treatment well   Behavior During Therapy [facility] for tasks assessed/performed      Past Medical History:  Diagnosis Date  . Atypical chest pain   . Depression   . Diabetes mellitus without complication ([place])   . Diabetic retinopathy ([place])   . Gout   . Hearing loss   . Hyperlipidemia   . Hypertension   . Onychomycosis   . UTI (lower urinary tract infection)     Past Surgical History:  Procedure Laterality Date  . CATARACT EXTRACTION, BILATERAL    . CHOLECYSTECTOMY    . COLONOSCOPY    . TONSILLECTOMY      There were no vitals filed for this visit.      Subjective Assessment - 01/26/16 0939    Subjective No new complaints. No falls or pain to report.    Limitations Walking;House hold activities   Currently in Pain? No/denies   Pain Score 0-No pain           [facility] Adult PT Treatment/Exercise - 01/26/16 0940      Ambulation/Gait   Ambulation/Gait Yes   Assistive device None   Gait Comments gait along ~50 foot hallway with no AD: forward gait with head movements up<>down and left<>right x 4 laps each with min guard to min assist at times. veering of course noted and balance loss x 4 episodes also noted with pt assisting in balance recovery.     High Level Balance   High Level Balance Activities Marching  forwards;Marching backwards;Tandem walking   High Level Balance Comments red/blue mats next to counter top: 3 laps each/each way with min guard to min assist, no UE support on counter with cues on posture, ex form/technique,              Balance Exercises - 01/26/16 0959      Balance Exercises: Standing   Standing Eyes Closed Wide (BOA);Narrow base of support (BOS);Head turns;Foam/compliant surface;Other reps (comment);20 secs;Limitations   Rockerboard Anterior/posterior;Lateral;Head turns;EC;20 seconds;10 reps     Balance Exercises: Standing   Standing Eyes Closed Limitations standing on gray foam pad in corner: pt progressed from wide base of support to narrow base of support- EC no head movements 30 sec's x 3, EC head movements up<>down and left<>right x 10 each, all with min guard to min assist for balance. increased assistance needed with narrow base of support.                        Rebounder Limitations performed both ways on balance board with no UE support: EO rocking board with emphasis on tall posture, EC holding board steady with no head movements progressing to head movements up<>down and left<>right.  PT Long Term Goals - 01/21/16 0855      PT LONG TERM GOAL #1   Title Pt will be independent with HEP in order to indicate improved functional mobility and decreased fall risk.  (Target Date: 01/27/16)   Time 4   Period Weeks   Status New     PT LONG TERM GOAL #2   Title Pt will improve DGI to >19/24 in order to indicate decreased fall risk.     Time 4   Period Weeks   Status New     PT LONG TERM GOAL #3   Title Pt will ambulate over varying outdoor surfaces x 1000' without AD with no LOB at independent level in order to indicate improved community mobility.      Time 4   Period Weeks   Status New     PT LONG TERM GOAL #4   Title Pt will verbalize understanding of CVA warning signs/risk factors in order to decrease time to seeking medical  attention in case of future CVA.     Baseline met 01/21/16   Time 4   Period Weeks   Status Achieved     PT LONG TERM GOAL #5   Title Pt will ambulate x 300' indoors at mod I level while scanning environment in order to indicate no LOB and safe return to community.    Time 4   Period Weeks            Plan - 01/26/16 0998    Clinical Impression Statement Today's skilled session continued to focus on high level balance activities that required increased vestibular system imput for balance without any issues reported. Pt does continued to demo instability and imbalance with challanged on compliant surfaces or with eyes closed. Pt is making steady progress toward goals and should benefit from continued PT to progress toward unmet goals.                                      Rehab Potential Excellent   PT Frequency 2x / week   PT Duration 4 weeks   PT Treatment/Interventions ADLs/Self Care Home Management;Canalith Repostioning;Electrical Stimulation;Gait training;Stair training;Functional mobility training;Therapeutic activities;Therapeutic exercise;Balance training;Neuromuscular re-education;Patient/family education;Vestibular   PT Next Visit Plan assess LTGs due to end of plan of care   Consulted and Agree with Plan of Care Patient      Patient will benefit from skilled therapeutic intervention in order to improve the following deficits and impairments:  Abnormal gait, Decreased balance, Decreased activity tolerance, Decreased mobility, Decreased strength, Dizziness, Impaired perceived functional ability, Postural dysfunction  Visit Diagnosis: Muscle weakness (generalized)  Other abnormalities of gait and mobility  Unsteadiness on feet  Dizziness and giddiness     Problem List Patient Active Problem List   Diagnosis Date Noted  . TIA (transient ischemic attack) 12/18/2015  . Diabetes mellitus, type 2 ([place]) 12/18/2015  . Essential hypertension 12/18/2015  . Hyperlipidemia  12/18/2015  . Depression 12/18/2015    Willow Ora, PTA, [facility] [address] [place], [place] [address] [phone] 01/26/16, 11:57 AM   Name: Vincent Alvarez MRN: [id number] Date of Birth: February 01, 1941

## 2016-01-28 ENCOUNTER — Ambulatory Visit: Payer: Medicare Other | Admitting: Rehabilitation

## 2016-01-28 ENCOUNTER — Encounter: Payer: Self-pay | Admitting: Rehabilitation

## 2016-01-28 DIAGNOSIS — M6281 Muscle weakness (generalized): Secondary | ICD-10-CM

## 2016-01-28 DIAGNOSIS — R2689 Other abnormalities of gait and mobility: Secondary | ICD-10-CM

## 2016-01-28 DIAGNOSIS — R42 Dizziness and giddiness: Secondary | ICD-10-CM | POA: Diagnosis not present

## 2016-01-28 DIAGNOSIS — R2681 Unsteadiness on feet: Secondary | ICD-10-CM

## 2016-01-28 NOTE — Therapy (Signed)
[facility] [address] [place], [place], [address] Phone: [phone]   Fax:  [phone]  Physical Therapy Treatment and D/C Summary  Patient Details  Name: Vincent Alvarez MRN: [id number] Date of Birth: 22-Nov-1940 Referring Provider: Melinda Crutch, MD  Encounter Date: 01/28/2016      PT End of Session - 01/28/16 0941    Visit Number 9   Number of Visits 9   Date for PT Re-Evaluation 01/29/16   Authorization Type MCR-G code every 10th visit   PT Start Time 0935  D/C visit, did not need whole time   PT Stop Time 1008   PT Time Calculation (min) 33 min   Equipment Utilized During Treatment Gait belt   Activity Tolerance Patient tolerated treatment well   Behavior During Therapy [facility] for tasks assessed/performed      Past Medical History:  Diagnosis Date  . Atypical chest pain   . Depression   . Diabetes mellitus without complication ([place])   . Diabetic retinopathy ([place])   . Gout   . Hearing loss   . Hyperlipidemia   . Hypertension   . Onychomycosis   . UTI (lower urinary tract infection)     Past Surgical History:  Procedure Laterality Date  . CATARACT EXTRACTION, BILATERAL    . CHOLECYSTECTOMY    . COLONOSCOPY    . TONSILLECTOMY      There were no vitals filed for this visit.      Subjective Assessment - 01/28/16 0940    Subjective Pt reports no changes since last visit.  Doing new exercises.   Limitations Walking;House hold activities   Currently in Pain? No/denies            [facility] PT Assessment - 01/28/16 0950      Functional Gait  Assessment   Gait assessed  Yes   Gait Level Surface Walks 20 ft in less than 7 sec but greater than 5.5 sec, uses assistive device, slower speed, mild gait deviations, or deviates 6-10 in outside of the 12 in walkway width.   Change in Gait Speed Able to smoothly change walking speed without loss of balance or gait deviation. Deviate no more than 6 in  outside of the 12 in walkway width.   Gait with Horizontal Head Turns Performs head turns smoothly with no change in gait. Deviates no more than 6 in outside 12 in walkway width   Gait with Vertical Head Turns Performs head turns with no change in gait. Deviates no more than 6 in outside 12 in walkway width.   Gait and Pivot Turn Pivot turns safely within 3 sec and stops quickly with no loss of balance.   Step Over Obstacle Is able to step over 2 stacked shoe boxes taped together (9 in total height) without changing gait speed. No evidence of imbalance.   Gait with Narrow Base of Support Is able to ambulate for 10 steps heel to toe with no staggering.   Gait with Eyes Closed Walks 20 ft, uses assistive device, slower speed, mild gait deviations, deviates 6-10 in outside 12 in walkway width. Ambulates 20 ft in less than 9 sec but greater than 7 sec.   Ambulating Backwards Walks 20 ft, uses assistive device, slower speed, mild gait deviations, deviates 6-10 in outside 12 in walkway width.   Steps Alternating feet, no rail.   Total Score 27  [facility] PT Treatment/Exercise - 01/28/16 1005      Ambulation/Gait   Ambulation/Gait Yes   Ambulation/Gait Assistance 6: Modified independent (Device/Increase time)   Ambulation/Gait Assistance Details Assessed gait over varying outdoor surfaces x 1000' for LTG assessment. Pt able to perform at mod I level with no evidence of LOB, even over grass while scanning environment.     Ambulation Distance (Feet) 1000 Feet   Assistive device None   Gait Pattern Within Functional Limits   Ambulation Surface Level;Unlevel;Outdoor;Indoor;Paved;Gravel     Standardized Balance Assessment   Standardized Balance Assessment Dynamic Gait Index     Dynamic Gait Index   Level Surface Normal   Change in Gait Speed Normal   Gait with Horizontal Head Turns Normal   Gait with Vertical Head Turns Normal   Gait and Pivot Turn Normal   Step  Over Obstacle Normal   Step Around Obstacles Normal   Steps Normal   Total Score 24                PT Education - 01/28/16 0940    Education provided Yes   Education Details continued compliance with HEP, increasing activity in community to maintain gains made in therapy.    Person(s) Educated Patient   Methods Explanation   Comprehension Verbalized understanding             PT Long Term Goals - 01/28/16 0941      PT LONG TERM GOAL #1   Title Pt will be independent with HEP in order to indicate improved functional mobility and decreased fall risk.  (Target Date: 01/27/16)   Baseline 01/28/16 met    Time 4   Period Weeks   Status Achieved     PT LONG TERM GOAL #2   Title Pt will improve DGI to >19/24 in order to indicate decreased fall risk.     Baseline 24/24 on 01/28/16, FGA 27/30 on 01/28/16   Time 4   Period Weeks   Status Achieved     PT LONG TERM GOAL #3   Title Pt will ambulate over varying outdoor surfaces x 1000' without AD with no LOB at independent level in order to indicate improved community mobility.      Baseline met 01/28/16   Time 4   Period Weeks   Status Achieved     PT LONG TERM GOAL #4   Title Pt will verbalize understanding of CVA warning signs/risk factors in order to decrease time to seeking medical attention in case of future CVA.     Baseline met 01/21/16   Time 4   Period Weeks   Status Achieved     PT LONG TERM GOAL #5   Title Pt will ambulate x 300' indoors at mod I level while scanning environment in order to indicate no LOB and safe return to community.    Baseline met 01/28/16   Time 4   Period Weeks   Status Achieved               Plan - 01/28/16 0941    Clinical Impression Statement Skilled session focused on assessment of LTGs and DC from therapy.   Pt has met 5/5 LTGs and demonstrates marked improvement in balance and dizziness.  Encouraged pt to continue with HEP for vestibular deficits and community  fitness to maintain gains made in therapy.  Pt verbalized understanding.     Rehab Potential Excellent   PT Frequency 2x / week   PT  Duration 4 weeks   PT Treatment/Interventions ADLs/Self Care Home Management;Canalith Repostioning;Electrical Stimulation;Gait training;Stair training;Functional mobility training;Therapeutic activities;Therapeutic exercise;Balance training;Neuromuscular re-education;Patient/family education;Vestibular   PT Next Visit Plan --   Consulted and Agree with Plan of Care Patient      Patient will benefit from skilled therapeutic intervention in order to improve the following deficits and impairments:  Abnormal gait, Decreased balance, Decreased activity tolerance, Decreased mobility, Decreased strength, Dizziness, Impaired perceived functional ability, Postural dysfunction  Visit Diagnosis: Muscle weakness (generalized)  Other abnormalities of gait and mobility  Unsteadiness on feet       G-Codes - 02/18/16 1003    Functional Assessment Tool Used DGI: 24/24   Functional Limitation Mobility: Walking and moving around   Mobility: Walking and Moving Around Current Status [phone]) 0 percent impaired, limited or restricted   Mobility: Walking and Moving Around Goal Status [phone]) At least 1 percent but less than 20 percent impaired, limited or restricted   Mobility: Walking and Moving Around Discharge Status [phone]) 0 percent impaired, limited or restricted      PHYSICAL THERAPY DISCHARGE SUMMARY  Visits from Start of Care: 9  Current functional level related to goals / functional outcomes: See LTGs above   Remaining deficits: Continues to have very high level balance deficits as related to vestibular system, has HEP to address this.    Education / Equipment: HEP  Plan: Patient agrees to discharge.  Patient goals were met. Patient is being discharged due to meeting the stated rehab goals.  ?????        Problem List Patient Active Problem List    Diagnosis Date Noted  . TIA (transient ischemic attack) 12/18/2015  . Diabetes mellitus, type 2 ([place]) 12/18/2015  . Essential hypertension 12/18/2015  . Hyperlipidemia 12/18/2015  . Depression 12/18/2015    Cameron Sprang, PT, MPT [facility] [address] [place], [place], [address] Phone: [phone]   Fax:  [phone] Feb 18, 2016, 11:02 AM  Name: Vincent Alvarez MRN: [id number] Date of Birth: 1940/11/27

## 2016-02-02 ENCOUNTER — Ambulatory Visit: Payer: Federal, State, Local not specified - PPO | Admitting: Physical Therapy

## 2016-02-04 ENCOUNTER — Ambulatory Visit: Payer: Federal, State, Local not specified - PPO | Admitting: Rehabilitation

## 2016-03-20 ENCOUNTER — Ambulatory Visit (INDEPENDENT_AMBULATORY_CARE_PROVIDER_SITE_OTHER): Payer: Medicare Other | Admitting: Neurology

## 2016-03-20 ENCOUNTER — Encounter: Payer: Self-pay | Admitting: Neurology

## 2016-03-20 VITALS — BP 153/75 | HR 65 | Ht 68.0 in | Wt 218.8 lb

## 2016-03-20 DIAGNOSIS — G459 Transient cerebral ischemic attack, unspecified: Secondary | ICD-10-CM | POA: Diagnosis not present

## 2016-03-20 DIAGNOSIS — I639 Cerebral infarction, unspecified: Secondary | ICD-10-CM | POA: Diagnosis not present

## 2016-03-20 NOTE — Patient Instructions (Addendum)
Stroke Prevention Some medical conditions and behaviors are associated with an increased chance of having a stroke. You may prevent a stroke by making healthy choices and managing medical conditions. How can I reduce my risk of having a stroke?  Stay physically active. Get at least 30 minutes of activity on most or all days.  Do not smoke. It may also be helpful to avoid exposure to secondhand smoke.  Limit alcohol use. Moderate alcohol use is considered to be:  No more than 2 drinks per day for men.  No more than 1 drink per day for nonpregnant women.  Eat healthy foods. This involves:  Eating 5 or more servings of fruits and vegetables a day.  Making dietary changes that address high blood pressure (hypertension), high cholesterol, diabetes, or obesity.  Manage your cholesterol levels.  Making food choices that are high in fiber and low in saturated fat, trans fat, and cholesterol may control cholesterol levels.  Take any prescribed medicines to control cholesterol as directed by your health care provider.  Manage your diabetes.  Controlling your carbohydrate and sugar intake is recommended to manage diabetes.  Take any prescribed medicines to control diabetes as directed by your health care provider.  Control your hypertension.  Making food choices that are low in salt (sodium), saturated fat, trans fat, and cholesterol is recommended to manage hypertension.  Ask your health care provider if you need treatment to lower your blood pressure. Take any prescribed medicines to control hypertension as directed by your health care provider.  If you are 18-39 years of age, have your blood pressure checked every 3-5 years. If you are 40 years of age or older, have your blood pressure checked every year.  Maintain a healthy weight.  Reducing calorie intake and making food choices that are low in sodium, saturated fat, trans fat, and cholesterol are recommended to manage  weight.  Stop drug abuse.  Avoid taking birth control pills.  Talk to your health care provider about the risks of taking birth control pills if you are over 35 years old, smoke, get migraines, or have ever had a blood clot.  Get evaluated for sleep disorders (sleep apnea).  Talk to your health care provider about getting a sleep evaluation if you snore a lot or have excessive sleepiness.  Take medicines only as directed by your health care provider.  For some people, aspirin or blood thinners (anticoagulants) are helpful in reducing the risk of forming abnormal blood clots that can lead to stroke. If you have the irregular heart rhythm of atrial fibrillation, you should be on a blood thinner unless there is a good reason you cannot take them.  Understand all your medicine instructions.  Make sure that other conditions (such as anemia or atherosclerosis) are addressed. Get help right away if:  You have sudden weakness or numbness of the face, arm, or leg, especially on one side of the body.  Your face or eyelid droops to one side.  You have sudden confusion.  You have trouble speaking (aphasia) or understanding.  You have sudden trouble seeing in one or both eyes.  You have sudden trouble walking.  You have dizziness.  You have a loss of balance or coordination.  You have a sudden, severe headache with no known cause.  You have new chest pain or an irregular heartbeat. Any of these symptoms may represent a serious problem that is an emergency. Do not wait to see if the symptoms will go away.   Get medical help at once. Call your local emergency services (911 in [place] Do not drive yourself to the hospital. This information is not intended to replace advice given to you by your health care provider. Make sure you discuss any questions you have with your health care provider. Document Released: 05/11/2004 Document Revised: 09/09/2015 Document Reviewed: 10/04/2012 Elsevier  Interactive Patient Education  2017 Elsevier Inc.  

## 2016-03-20 NOTE — Progress Notes (Signed)
[facility] [address] [place]. [place] [address] [phone]       OFFICE FOLLOW-UP NOTE  [name] Date of Birth:  08-27-1940 Medical Record Number:  [id number]   HPI: 75 year old Caucasian male seen today for first office follow-up visit following hospital hospital admission for TIA in September 2017. Vincent Deborde Whitworthis a 75 y.o.malewho complained of right facial and arm numbness started around 2:30 PM. He states that he was dropping things repeatedly from his right arm. He then noticed significant improvement after coming to the hospital. Initially, Codestroke was considered, but given the improvement in the mild symptoms, it was decided that TPA was not indicated. LKW: 2:30 PM on 12/18/2015 tpa given?: no, mild symptoms, rapidly improving symptoms.Marland Kitchen MRI scan of the brain showed no acute abnormality and only moderate changes of chronic microvascular ischemia. MRI of the brain showed no large vessel intracranial stenosis or occlusion. Transthoracic echo showed normal ejection fraction without cardiac source of embolism. LDL cholesterol was 40 mg percent and hemoglobin A1c was elevated at 7.3. Carotid ultrasound showed no significant extracranial stenosis. Patient was started on aspirin for stroke prevention and states his done well. Is a no recurrent TIA or stroke symptoms. He denies any prior history of TIA and stroke seizures or significant neurological problems. He states his tolerate aspirin well without bleeding or bruising. His blood pressure is quite well controlled at home though today it is elevated at 153/75. His sugars seem to better control as well. His is tolerating his atorvastatin without any side effects. He has no complaints today except mild dizziness and balance difficulties which are particularly when he is tired. He has had no falls or injuries fortunately. ROS:   14 system review of systems is positive for  hearing loss, inanition  problems, headache, dizziness and all other systems negative  PMH:  Past Medical History:  Diagnosis Date  . Atypical chest pain   . Depression   . Diabetes mellitus without complication ([place])   . Diabetic retinopathy ([place])   . Gout   . Hearing loss   . Hyperlipidemia   . Hypertension   . Onychomycosis   . UTI (lower urinary tract infection)     Social History:  Social History   Social History  . Marital status: Married    Spouse name: Olin Hauser  . Number of children: 0  . Years of education: College   Occupational History  . retired - Actor    Social History Main Topics  . Smoking status: Former Smoker    Quit date: 06/18/1985  . Smokeless tobacco: Never Used  . Alcohol use 1.2 oz/week    2 Glasses of wine per week     Comment: thinks he may be drinking too much, 2/4 + CAGE questions  . Drug use: No  . Sexual activity: Not on file   Other Topics Concern  . Not on file   Social History Narrative   Lives with spouse   Caffeine use: 3 coffee/day    Medications:   Current Outpatient Prescriptions on File Prior to Visit  Medication Sig Dispense Refill  . amLODipine (NORVASC) 5 MG tablet Take 5 mg by mouth every morning.    Marland Kitchen aspirin 325 MG tablet Take 1 tablet (325 mg total) by mouth daily. 30 tablet 0  . atorvastatin (LIPITOR) 20 MG tablet Take 20 mg by mouth every morning.     Marland Kitchen FLUoxetine (PROZAC) 20 MG capsule Take 20 mg by mouth  every morning.     . fluticasone (FLONASE) 50 MCG/ACT nasal spray Place 1 spray into both nostrils daily as needed for allergies.     . metFORMIN (GLUCOPHAGE) 500 MG tablet Take 1,000 mg by mouth 2 (two) times daily with a meal.     . ramipril (ALTACE) 10 MG capsule Take 10 mg by mouth daily.    . sitaGLIPtin (JANUVIA) 100 MG tablet Take 100 mg by mouth daily.    . tamsulosin (FLOMAX) 0.4 MG CAPS capsule Take 0.4 mg by mouth daily after supper.     . vitamin B-12 (CYANOCOBALAMIN) 250 MCG tablet Take 250 mcg by mouth daily.      No current facility-administered medications on file prior to visit.     Allergies:   Allergies  Allergen Reactions  . Shrimp [Shellfish Allergy] Nausea And Vomiting    Physical Exam General: well developed, well nourished, seated, in no evident distress Head: head normocephalic and atraumatic.  Neck: supple with no carotid or supraclavicular bruits Cardiovascular: regular rate and rhythm, no murmurs Musculoskeletal: no deformity Skin:  no rash/petichiae Vascular:  Normal pulses all extremities Vitals:   03/20/16 1355  BP: (!) 153/75  Pulse: 65   Neurologic Exam Mental Status: Awake and fully alert. Oriented to place and time. Recent and remote memory intact. Attention span, concentration and fund of knowledge appropriate. Mood and affect appropriate.  Cranial Nerves: Fundoscopic exam reveals sharp disc margins. Pupils equal, briskly reactive to light. Extraocular movements full without nystagmus. Visual fields full to confrontation. Hearing intact. Facial sensation intact. Face, tongue, palate moves normally and symmetrically.  Motor: Normal bulk and tone. Normal strength in all tested extremity muscles. Sensory.: intact to touch ,pinprick .position and vibratory sensation.  Coordination: Rapid alternating movements normal in all extremities. Finger-to-nose and heel-to-shin performed accurately bilaterally. Gait and Station: Arises from chair without difficulty. Stance is normal. Gait demonstrates normal stride length and balance . Unable to heel, toe and tandem walk without difficulty.  Reflexes: 1+ and symmetric except right ankle jerk is depressed. Toes downgoing.   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    PLAN: I had a long d/w patient about his recent TIA risk for recurrent stroke/TIAs,  personally independently reviewed imaging studies and stroke evaluation results and answered questions.Continue aspirin 325 mg daily  for secondary stroke prevention and maintain strict control of hypertension with blood pressure goal below 130/90, diabetes with hemoglobin A1c goal below 6.5% and lipids with LDL cholesterol goal below 70 mg/dL. I also advised the patient to eat a healthy diet with plenty of whole grains, cereals, fruits and vegetables, exercise regularly and maintain ideal body weight Followup in the future with my nurse practitioner in 6 months or call earlier if necessary Greater than 50% of time during this 25 minute visit was spent on counseling,explanation of diagnosis, planning of further management, discussion with patient and family and coordination of care Antony Contras, MD  [facility] [address] [place],  [address]  Phone [phone] Fax [phone] Note: This document was prepared with digital dictation and possible smart phrase technology. Any transcriptional errors that result from this process are unintentional

## 2016-03-31 DIAGNOSIS — I679 Cerebrovascular disease, unspecified: Secondary | ICD-10-CM | POA: Diagnosis not present

## 2016-03-31 DIAGNOSIS — F324 Major depressive disorder, single episode, in partial remission: Secondary | ICD-10-CM | POA: Diagnosis not present

## 2016-03-31 DIAGNOSIS — N400 Enlarged prostate without lower urinary tract symptoms: Secondary | ICD-10-CM | POA: Diagnosis not present

## 2016-03-31 DIAGNOSIS — E785 Hyperlipidemia, unspecified: Secondary | ICD-10-CM | POA: Diagnosis not present

## 2016-03-31 DIAGNOSIS — I10 Essential (primary) hypertension: Secondary | ICD-10-CM | POA: Diagnosis not present

## 2016-03-31 DIAGNOSIS — E11319 Type 2 diabetes mellitus with unspecified diabetic retinopathy without macular edema: Secondary | ICD-10-CM | POA: Diagnosis not present

## 2016-03-31 DIAGNOSIS — Z7984 Long term (current) use of oral hypoglycemic drugs: Secondary | ICD-10-CM | POA: Diagnosis not present

## 2016-06-27 DIAGNOSIS — E113393 Type 2 diabetes mellitus with moderate nonproliferative diabetic retinopathy without macular edema, bilateral: Secondary | ICD-10-CM | POA: Diagnosis not present

## 2016-06-27 DIAGNOSIS — H33301 Unspecified retinal break, right eye: Secondary | ICD-10-CM | POA: Diagnosis not present

## 2016-09-18 ENCOUNTER — Encounter: Payer: Self-pay | Admitting: Nurse Practitioner

## 2016-09-18 ENCOUNTER — Ambulatory Visit (INDEPENDENT_AMBULATORY_CARE_PROVIDER_SITE_OTHER): Payer: Medicare Other | Admitting: Nurse Practitioner

## 2016-09-18 VITALS — BP 137/78 | HR 87 | Ht 68.0 in | Wt 231.0 lb

## 2016-09-18 DIAGNOSIS — E785 Hyperlipidemia, unspecified: Secondary | ICD-10-CM | POA: Diagnosis not present

## 2016-09-18 DIAGNOSIS — E11319 Type 2 diabetes mellitus with unspecified diabetic retinopathy without macular edema: Secondary | ICD-10-CM | POA: Diagnosis not present

## 2016-09-18 DIAGNOSIS — I10 Essential (primary) hypertension: Secondary | ICD-10-CM | POA: Diagnosis not present

## 2016-09-18 DIAGNOSIS — G459 Transient cerebral ischemic attack, unspecified: Secondary | ICD-10-CM

## 2016-09-18 NOTE — Patient Instructions (Addendum)
Stressed the importance of management of risk factors to prevent further stroke Continue aspirinfor secondary stroke prevention/TIA Maintain strict control of hypertension with blood pressure goal below 130/90, today's reading 137/78 continue antihypertensive medications Control of diabetes with hemoglobin A1c below 6.5 followed by primary care continue diabetic medications Cholesterol with LDL cholesterol less than 70, followed by primary care,   continue Lipitor Exercise by walking, stay well hydrated  eat healthy diet with whole grains,  fresh fruits and vegetables Change positions slowly when moving from bed to chair  Will discharge from stroke clinic Stroke Prevention Some medical conditions and behaviors are associated with an increased chance of having a stroke. You may prevent a stroke by making healthy choices and managing medical conditions. How can I reduce my risk of having a stroke?  Stay physically active. Get at least 30 minutes of activity on most or all days.  Do not smoke. It may also be helpful to avoid exposure to secondhand smoke.  Limit alcohol use. Moderate alcohol use is considered to be: ? No more than 2 drinks per day for men. ? No more than 1 drink per day for nonpregnant women.  Eat healthy foods. This involves: ? Eating 5 or more servings of fruits and vegetables a day. ? Making dietary changes that address high blood pressure (hypertension), high cholesterol, diabetes, or obesity.  Manage your cholesterol levels. ? Making food choices that are high in fiber and low in saturated fat, trans fat, and cholesterol may control cholesterol levels. ? Take any prescribed medicines to control cholesterol as directed by your health care provider.  Manage your diabetes. ? Controlling your carbohydrate and sugar intake is recommended to manage diabetes. ? Take any prescribed medicines to control diabetes as directed by your health care provider.  Control your  hypertension. ? Making food choices that are low in salt (sodium), saturated fat, trans fat, and cholesterol is recommended to manage hypertension. ? Ask your health care provider if you need treatment to lower your blood pressure. Take any prescribed medicines to control hypertension as directed by your health care provider. ? If you are 76-22 years of age, have your blood pressure checked every 3-5 years. If you are 78 years of age or older, have your blood pressure checked every year.  Maintain a healthy weight. ? Reducing calorie intake and making food choices that are low in sodium, saturated fat, trans fat, and cholesterol are recommended to manage weight.  Stop drug abuse.  Avoid taking birth control pills. ? Talk to your health care provider about the risks of taking birth control pills if you are over 9 years old, smoke, get migraines, or have ever had a blood clot.  Get evaluated for sleep disorders (sleep apnea). ? Talk to your health care provider about getting a sleep evaluation if you snore a lot or have excessive sleepiness.  Take medicines only as directed by your health care provider. ? For some people, aspirin or blood thinners (anticoagulants) are helpful in reducing the risk of forming abnormal blood clots that can lead to stroke. If you have the irregular heart rhythm of atrial fibrillation, you should be on a blood thinner unless there is a good reason you cannot take them. ? Understand all your medicine instructions.  Make sure that other conditions (such as anemia or atherosclerosis) are addressed. Get help right away if:  You have sudden weakness or numbness of the face, arm, or leg, especially on one side of the body.  Your face or eyelid droops to one side.  You have sudden confusion.  You have trouble speaking (aphasia) or understanding.  You have sudden trouble seeing in one or both eyes.  You have sudden trouble walking.  You have dizziness.  You  have a loss of balance or coordination.  You have a sudden, severe headache with no known cause.  You have new chest pain or an irregular heartbeat. Any of these symptoms may represent a serious problem that is an emergency. Do not wait to see if the symptoms will go away. Get medical help at once. Call your local emergency services (911 in [place] Do not drive yourself to the hospital. This information is not intended to replace advice given to you by your health care provider. Make sure you discuss any questions you have with your health care provider. Document Released: 05/11/2004 Document Revised: 09/09/2015 Document Reviewed: 10/04/2012 Elsevier Interactive Patient Education  2017 [employer]  Dizziness Dizziness is a common problem. It is a feeling of unsteadiness or light-headedness. You may feel like you are about to faint. Dizziness can lead to injury if you stumble or fall. Anyone can become dizzy, but dizziness is more common in older adults. This condition can be caused by a number of things, including medicines, dehydration, or illness. Follow these instructions at home: Taking these steps may help with your condition: Eating and drinking  Drink enough fluid to keep your urine clear or pale yellow. This helps to keep you from becoming dehydrated. Try to drink more clear fluids, such as water.  Do not drink alcohol.  Limit your caffeine intake if directed by your health care provider.  Limit your salt intake if directed by your health care provider. Activity  Avoid making quick movements. ? Rise slowly from chairs and steady yourself until you feel okay. ? In the morning, first sit up on the side of the bed. When you feel okay, stand slowly while you hold onto something until you know that your balance is fine.  Move your legs often if you need to stand in one place for a long time. Tighten and relax your muscles in your legs while you are standing.  Do not drive or  operate heavy machinery if you feel dizzy.  Avoid bending down if you feel dizzy. Place items in your home so that they are easy for you to reach without leaning over. Lifestyle  Do not use any tobacco products, including cigarettes, chewing tobacco, or electronic cigarettes. If you need help quitting, ask your health care provider.  Try to reduce your stress level, such as with yoga or meditation. Talk with your health care provider if you need help. General instructions  Watch your dizziness for any changes.  Take medicines only as directed by your health care provider. Talk with your health care provider if you think that your dizziness is caused by a medicine that you are taking.  Tell a friend or a family member that you are feeling dizzy. If he or she notices any changes in your behavior, have this person call your health care provider.  Keep all follow-up visits as directed by your health care provider. This is important. Contact a health care provider if:  Your dizziness does not go away.  Your dizziness or light-headedness gets worse.  You feel nauseous.  You have reduced hearing.  You have new symptoms.  You are unsteady on your feet or you feel like the room is spinning. Get  help right away if:  You vomit or have diarrhea and are unable to eat or drink anything.  You have problems talking, walking, swallowing, or using your arms, hands, or legs.  You feel generally weak.  You are not thinking clearly or you have trouble forming sentences. It may take a friend or family member to notice this.  You have chest pain, abdominal pain, shortness of breath, or sweating.  Your vision changes.  You notice any bleeding.  You have a headache.  You have neck pain or a stiff neck.  You have a fever. This information is not intended to replace advice given to you by your health care provider. Make sure you discuss any questions you have with your health care  provider. Document Released: 09/27/2000 Document Revised: 09/09/2015 Document Reviewed: 03/30/2014 Elsevier Interactive Patient Education  2017 [employer].

## 2016-09-18 NOTE — Progress Notes (Signed)
[facility]  PATIENT: Vincent Alvarez DOB: [birth year]   REASON FOR VISIT: Follow-up for TIA in September 2017 HISTORY FROM: Patient    HISTORY OF PRESENT ILLNESS:UPDATE 06/04/2018CM [name], 76 year old male returns for follow-up with history of TIA in September 2017. He has  not had further stroke or TIA symptoms since that time. He is currently on aspirin 325 mg daily with minimal bruising and no bleeding. He is also on Lipitor without complaints of myalgias. Blood pressure the office today 137/78 He has diabetes and his fasting glucose runs between 120 and 130 and his most recent hemoglobin A1c was over 7. He walks some for exercise. He does not follow a diabetic diet. He complains with some occasional dizziness especially when changing positions. He claims he stays well-hydrated. He has not had any falls and he ambulates with single-point cane. He returns for reevaluation HISTORY 03/20/16 PS38 year old Caucasian male seen today for first office follow-up visit following hospital hospital admission for TIA in September 2017. Vincent Gillentine Whitworthis a 76 y.o.malewho complained of right facial and arm numbnessstarted around 2:30 PM. He states that he was dropping things repeatedly from his right arm. He then noticed significant improvementafter coming to the hospital. Initially, Codestroke was considered, but given the improvement in the mild symptoms, it was decided that TPA was not indicated. LKW: 2:30 PM on 12/18/2015 tpa given?: no, mild symptoms, rapidly improving symptoms.Marland Kitchen MRI scan of the brain showed no acute abnormality and only moderate changes of chronic microvascular ischemia. MRI of the brain showed no large vessel intracranial stenosis or occlusion. Transthoracic echo showed normal ejection fraction without cardiac source of embolism. LDL cholesterol was 40 mg percent and hemoglobin A1c was elevated at 7.3. Carotid ultrasound showed no significant  extracranial stenosis. Patient was started on aspirin for stroke prevention and states his done well. Is a no recurrent TIA or stroke symptoms. He denies any prior history of TIA and stroke seizures or significant neurological problems. He states his tolerate aspirin well without bleeding or bruising. His blood pressure is quite well controlled at home though today it is elevated at 153/75. His sugars seem to better control as well. His is tolerating his atorvastatin without any side effects. He has no complaints today except mild dizziness and balance difficulties which are particularly when he is tired. He has had no falls or injuries fortunately.   REVIEW OF SYSTEMS: Full 14 system review of systems performed and notable only for those listed, all others are neg:  Constitutional: neg  Cardiovascular: neg Ear/Nose/Throat: neg  Skin: neg Eyes: neg Respiratory: neg Gastroitestinal: neg  Hematology/Lymphatic: neg  Endocrine: neg Musculoskeletal:neg Allergy/Immunology: neg Neurological: Occasional dizziness when changing positions Psychiatric: neg Sleep : neg   ALLERGIES: Allergies  Allergen Reactions  . Shrimp [Shellfish Allergy] Nausea And Vomiting    HOME MEDICATIONS: Outpatient Medications Prior to Visit  Medication Sig Dispense Refill  . amLODipine (NORVASC) 5 MG tablet Take 5 mg by mouth every morning.    Marland Kitchen aspirin 325 MG tablet Take 1 tablet (325 mg total) by mouth daily. 30 tablet 0  . atorvastatin (LIPITOR) 20 MG tablet Take 20 mg by mouth every morning.     . B Complex Vitamins (B COMPLEX PO) Take 1 tablet by mouth daily.    Marland Kitchen FLUoxetine (PROZAC) 20 MG capsule Take 20 mg by mouth every morning.     . fluticasone (FLONASE) 50 MCG/ACT nasal spray Place 1 spray into both nostrils daily as needed for  allergies.     . metFORMIN (GLUCOPHAGE) 500 MG tablet Take 1,000 mg by mouth 2 (two) times daily with a meal.     . ramipril (ALTACE) 10 MG capsule Take 10 mg by mouth daily.      . sitaGLIPtin (JANUVIA) 100 MG tablet Take 100 mg by mouth daily.    . tamsulosin (FLOMAX) 0.4 MG CAPS capsule Take 0.4 mg by mouth daily after supper.     . vitamin B-12 (CYANOCOBALAMIN) 250 MCG tablet Take 250 mcg by mouth daily.     No facility-administered medications prior to visit.     PAST MEDICAL HISTORY: Past Medical History:  Diagnosis Date  . Atypical chest pain   . Depression   . Diabetes mellitus without complication ([place])   . Diabetic retinopathy ([place])   . Gout   . Hearing loss   . Hyperlipidemia   . Hypertension   . Onychomycosis   . UTI (lower urinary tract infection)     PAST SURGICAL HISTORY: Past Surgical History:  Procedure Laterality Date  . CATARACT EXTRACTION, BILATERAL    . CHOLECYSTECTOMY    . COLONOSCOPY    . TONSILLECTOMY      FAMILY HISTORY: Family History  Problem Relation Age of Onset  . CAD Mother 54  . Colon cancer Father 70    SOCIAL HISTORY: Social History   Social History  . Marital status: Married    Spouse name: Olin Hauser  . Number of children: 0  . Years of education: College   Occupational History  . retired - Actor    Social History Main Topics  . Smoking status: Former Smoker    Quit date: 06/18/1985  . Smokeless tobacco: Never Used  . Alcohol use 1.2 oz/week    2 Glasses of wine per week     Comment: thinks he may be drinking too much, 2/4 + CAGE questions  . Drug use: No  . Sexual activity: Not on file   Other Topics Concern  . Not on file   Social History Narrative   Lives with spouse   Caffeine use: 3 coffee/day     PHYSICAL EXAM  Vitals:   09/18/16 1337  BP: 137/78  Pulse: 87  Weight: 231 lb (104.8 kg)  Height: 5\' 8"  (1.727 m)   Body mass index is 35.12 kg/m.  Generalized: Well developed, Obese male in no acute distress  Head: normocephalic and atraumatic,. Oropharynx benign  Neck: Supple, no carotid bruits  Cardiac: Regular rate rhythm, no murmur  Musculoskeletal: No deformity    Neurological examination   Mentation: Alert oriented to time, place, history taking. Attention span and concentration appropriate. Recent and remote memory intact.  Follows all commands speech and language fluent.   Cranial nerve II-XII: Fundoscopic exam not done.Pupils were equal round reactive to light extraocular movements were full, visual field were full on confrontational test. Facial sensation and strength were normal. hearing was intact to finger rubbing bilaterally. Uvula tongue midline. head turning and shoulder shrug were normal and symmetric.Tongue protrusion into cheek strength was normal. Motor: normal bulk and tone, full strength in the BUE, BLE, fine finger movements normal, no pronator drift. No focal weakness Sensory: normal and symmetric to light touch, in the upper and lower extremities Coordination: finger-nose-finger, heel-to-shin bilaterally, no dysmetria Reflexes: 1+ upper lower and symmetric except depressed ankle jerk on the right , plantar responses were flexor bilaterally. Gait and Station: Rising up from seated position without assistance, normal stance,  moderate stride,  good arm swing, smooth turning, able to perform tiptoe, and heel walking without difficulty. Tandem gait is mildly unsteady. Ambulates with a single-point cane  DIAGNOSTIC DATA (LABS, IMAGING, TESTING) - I reviewed patient records, labs, notes, testing and imaging myself where available.  Lab Results  Component Value Date   WBC 7.4 12/18/2015   HGB 14.6 12/18/2015   HCT 43.0 12/18/2015   MCV 87.8 12/18/2015   PLT 209 12/18/2015      Component Value Date/Time   NA 136 12/18/2015 1628   K 4.1 12/18/2015 1628   CL 97 (L) 12/18/2015 1628   CO2 26 12/18/2015 1615   GLUCOSE 158 (H) 12/18/2015 1628   BUN 18 12/18/2015 1628   CREATININE 1.00 12/18/2015 1628   CALCIUM 9.9 12/18/2015 1615   PROT 7.3 12/18/2015 1615   ALBUMIN 4.4 12/18/2015 1615   AST 20 12/18/2015 1615   ALT 25 12/18/2015  1615   ALKPHOS 68 12/18/2015 1615   BILITOT 0.7 12/18/2015 1615   GFRNONAA >60 12/18/2015 1615   GFRAA >60 12/18/2015 1615   Lab Results  Component Value Date   CHOL 114 12/19/2015   HDL 46 12/19/2015   LDLCALC 40 12/19/2015   TRIG 141 12/19/2015   CHOLHDL 2.5 12/19/2015   Lab Results  Component Value Date   HGBA1C 7.3 (H) 12/19/2015    ASSESSMENT AND PLAN 72 year Caucasian male with 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 Patient notes that he has occasional dizziness when changing positions.  Stressed the importance of management of risk factors to prevent further stroke Continue aspirinfor secondary stroke prevention/TIA Maintain strict control of hypertension with blood pressure goal below 130/90, today's reading 137/78 continue antihypertensive medications Control of diabetes with hemoglobin A1c below 6.5 followed by primary care continue diabetic medications Cholesterol with LDL cholesterol less than 70, followed by primary care,   continue Lipitor Exercise by walking, stay well hydrated  eat healthy diet with whole grains,  fresh fruits and vegetables Change positions slowly when moving from bed to chair  Will discharge from stroke clinic I spent 25 min in total face to face time with the patient more than 50% of which was spent counseling and coordination of care, reviewing test results reviewing medications and discussing and reviewing the diagnosis of stroke and importance of management of stroke risk factors . We also discussed dizziness and the importance of limiting caffeine intake, avoiding quick movements slowly from chair. In the morning sitting on the side of the bed and get your  balance for a few minutes. Move the legs often if you need to stand for long periods of time. Watch  dizziness for any changes.  Dennie Bible, [facility], [facility], APRN  [facility] [address], [place], [place] [address] [phone]

## 2016-09-19 NOTE — Progress Notes (Signed)
I agree with the above plan 

## 2016-10-03 DIAGNOSIS — E11319 Type 2 diabetes mellitus with unspecified diabetic retinopathy without macular edema: Secondary | ICD-10-CM | POA: Diagnosis not present

## 2016-12-21 DIAGNOSIS — M7541 Impingement syndrome of right shoulder: Secondary | ICD-10-CM | POA: Diagnosis not present

## 2017-01-24 DIAGNOSIS — E103393 Type 1 diabetes mellitus with moderate nonproliferative diabetic retinopathy without macular edema, bilateral: Secondary | ICD-10-CM | POA: Diagnosis not present

## 2017-01-25 DIAGNOSIS — Z23 Encounter for immunization: Secondary | ICD-10-CM | POA: Diagnosis not present

## 2017-04-04 DIAGNOSIS — I10 Essential (primary) hypertension: Secondary | ICD-10-CM | POA: Diagnosis not present

## 2017-04-04 DIAGNOSIS — E785 Hyperlipidemia, unspecified: Secondary | ICD-10-CM | POA: Diagnosis not present

## 2017-04-04 DIAGNOSIS — H43813 Vitreous degeneration, bilateral: Secondary | ICD-10-CM | POA: Diagnosis not present

## 2017-04-04 DIAGNOSIS — I679 Cerebrovascular disease, unspecified: Secondary | ICD-10-CM | POA: Diagnosis not present

## 2017-04-04 DIAGNOSIS — E113393 Type 2 diabetes mellitus with moderate nonproliferative diabetic retinopathy without macular edema, bilateral: Secondary | ICD-10-CM | POA: Diagnosis not present

## 2017-04-04 DIAGNOSIS — M25559 Pain in unspecified hip: Secondary | ICD-10-CM | POA: Diagnosis not present

## 2017-04-04 DIAGNOSIS — Z125 Encounter for screening for malignant neoplasm of prostate: Secondary | ICD-10-CM | POA: Diagnosis not present

## 2017-04-04 DIAGNOSIS — Z7984 Long term (current) use of oral hypoglycemic drugs: Secondary | ICD-10-CM | POA: Diagnosis not present

## 2017-04-04 DIAGNOSIS — E11319 Type 2 diabetes mellitus with unspecified diabetic retinopathy without macular edema: Secondary | ICD-10-CM | POA: Diagnosis not present

## 2017-05-09 DIAGNOSIS — I10 Essential (primary) hypertension: Secondary | ICD-10-CM | POA: Diagnosis not present

## 2017-05-09 DIAGNOSIS — E11319 Type 2 diabetes mellitus with unspecified diabetic retinopathy without macular edema: Secondary | ICD-10-CM | POA: Diagnosis not present

## 2017-05-09 DIAGNOSIS — F324 Major depressive disorder, single episode, in partial remission: Secondary | ICD-10-CM | POA: Diagnosis not present

## 2017-05-09 DIAGNOSIS — Z0000 Encounter for general adult medical examination without abnormal findings: Secondary | ICD-10-CM | POA: Diagnosis not present

## 2017-05-09 DIAGNOSIS — I679 Cerebrovascular disease, unspecified: Secondary | ICD-10-CM | POA: Diagnosis not present

## 2017-05-09 DIAGNOSIS — E785 Hyperlipidemia, unspecified: Secondary | ICD-10-CM | POA: Diagnosis not present

## 2017-05-09 DIAGNOSIS — N400 Enlarged prostate without lower urinary tract symptoms: Secondary | ICD-10-CM | POA: Diagnosis not present

## 2017-05-09 DIAGNOSIS — Z7984 Long term (current) use of oral hypoglycemic drugs: Secondary | ICD-10-CM | POA: Diagnosis not present

## 2017-11-08 DIAGNOSIS — E669 Obesity, unspecified: Secondary | ICD-10-CM | POA: Diagnosis not present

## 2017-11-08 DIAGNOSIS — Z7984 Long term (current) use of oral hypoglycemic drugs: Secondary | ICD-10-CM | POA: Diagnosis not present

## 2017-11-08 DIAGNOSIS — M179 Osteoarthritis of knee, unspecified: Secondary | ICD-10-CM | POA: Diagnosis not present

## 2017-11-08 DIAGNOSIS — E11319 Type 2 diabetes mellitus with unspecified diabetic retinopathy without macular edema: Secondary | ICD-10-CM | POA: Diagnosis not present

## 2017-11-23 DIAGNOSIS — Z6835 Body mass index (BMI) 35.0-35.9, adult: Secondary | ICD-10-CM | POA: Diagnosis not present

## 2017-11-23 DIAGNOSIS — M1712 Unilateral primary osteoarthritis, left knee: Secondary | ICD-10-CM | POA: Diagnosis not present

## 2017-12-27 DIAGNOSIS — Z23 Encounter for immunization: Secondary | ICD-10-CM | POA: Diagnosis not present

## 2017-12-27 DIAGNOSIS — R361 Hematospermia: Secondary | ICD-10-CM | POA: Diagnosis not present

## 2017-12-27 DIAGNOSIS — I10 Essential (primary) hypertension: Secondary | ICD-10-CM | POA: Diagnosis not present

## 2017-12-31 DIAGNOSIS — Z6835 Body mass index (BMI) 35.0-35.9, adult: Secondary | ICD-10-CM | POA: Diagnosis not present

## 2017-12-31 DIAGNOSIS — M1712 Unilateral primary osteoarthritis, left knee: Secondary | ICD-10-CM | POA: Diagnosis not present

## 2018-01-17 DIAGNOSIS — E113393 Type 2 diabetes mellitus with moderate nonproliferative diabetic retinopathy without macular edema, bilateral: Secondary | ICD-10-CM | POA: Diagnosis not present

## 2018-01-17 DIAGNOSIS — H43813 Vitreous degeneration, bilateral: Secondary | ICD-10-CM | POA: Diagnosis not present

## 2018-01-28 DIAGNOSIS — Z961 Presence of intraocular lens: Secondary | ICD-10-CM | POA: Diagnosis not present

## 2018-06-12 DIAGNOSIS — E11319 Type 2 diabetes mellitus with unspecified diabetic retinopathy without macular edema: Secondary | ICD-10-CM | POA: Diagnosis not present

## 2018-06-12 DIAGNOSIS — I679 Cerebrovascular disease, unspecified: Secondary | ICD-10-CM | POA: Diagnosis not present

## 2018-06-12 DIAGNOSIS — R229 Localized swelling, mass and lump, unspecified: Secondary | ICD-10-CM | POA: Diagnosis not present

## 2018-06-12 DIAGNOSIS — Z0000 Encounter for general adult medical examination without abnormal findings: Secondary | ICD-10-CM | POA: Diagnosis not present

## 2018-06-12 DIAGNOSIS — I10 Essential (primary) hypertension: Secondary | ICD-10-CM | POA: Diagnosis not present

## 2018-06-12 DIAGNOSIS — N400 Enlarged prostate without lower urinary tract symptoms: Secondary | ICD-10-CM | POA: Diagnosis not present

## 2018-06-12 DIAGNOSIS — M179 Osteoarthritis of knee, unspecified: Secondary | ICD-10-CM | POA: Diagnosis not present

## 2018-06-12 DIAGNOSIS — E785 Hyperlipidemia, unspecified: Secondary | ICD-10-CM | POA: Diagnosis not present

## 2018-06-12 DIAGNOSIS — F324 Major depressive disorder, single episode, in partial remission: Secondary | ICD-10-CM | POA: Diagnosis not present

## 2018-10-30 DIAGNOSIS — E113293 Type 2 diabetes mellitus with mild nonproliferative diabetic retinopathy without macular edema, bilateral: Secondary | ICD-10-CM | POA: Diagnosis not present

## 2018-10-30 DIAGNOSIS — H43813 Vitreous degeneration, bilateral: Secondary | ICD-10-CM | POA: Diagnosis not present

## 2018-10-30 DIAGNOSIS — Z961 Presence of intraocular lens: Secondary | ICD-10-CM | POA: Diagnosis not present

## 2018-10-30 DIAGNOSIS — H33311 Horseshoe tear of retina without detachment, right eye: Secondary | ICD-10-CM | POA: Diagnosis not present

## 2018-10-30 DIAGNOSIS — H353131 Nonexudative age-related macular degeneration, bilateral, early dry stage: Secondary | ICD-10-CM | POA: Diagnosis not present

## 2018-10-30 DIAGNOSIS — H31091 Other chorioretinal scars, right eye: Secondary | ICD-10-CM | POA: Diagnosis not present

## 2018-10-30 DIAGNOSIS — H35363 Drusen (degenerative) of macula, bilateral: Secondary | ICD-10-CM | POA: Diagnosis not present

## 2018-12-11 DIAGNOSIS — M179 Osteoarthritis of knee, unspecified: Secondary | ICD-10-CM | POA: Diagnosis not present

## 2018-12-11 DIAGNOSIS — I10 Essential (primary) hypertension: Secondary | ICD-10-CM | POA: Diagnosis not present

## 2018-12-11 DIAGNOSIS — E11319 Type 2 diabetes mellitus with unspecified diabetic retinopathy without macular edema: Secondary | ICD-10-CM | POA: Diagnosis not present

## 2018-12-11 DIAGNOSIS — Z23 Encounter for immunization: Secondary | ICD-10-CM | POA: Diagnosis not present

## 2018-12-18 DIAGNOSIS — M25562 Pain in left knee: Secondary | ICD-10-CM | POA: Diagnosis not present

## 2019-02-04 DIAGNOSIS — H524 Presbyopia: Secondary | ICD-10-CM | POA: Diagnosis not present

## 2019-02-04 DIAGNOSIS — Z961 Presence of intraocular lens: Secondary | ICD-10-CM | POA: Diagnosis not present

## 2019-02-04 DIAGNOSIS — E103293 Type 1 diabetes mellitus with mild nonproliferative diabetic retinopathy without macular edema, bilateral: Secondary | ICD-10-CM | POA: Diagnosis not present

## 2019-05-05 DIAGNOSIS — H353131 Nonexudative age-related macular degeneration, bilateral, early dry stage: Secondary | ICD-10-CM | POA: Diagnosis not present

## 2019-05-05 DIAGNOSIS — Z961 Presence of intraocular lens: Secondary | ICD-10-CM | POA: Diagnosis not present

## 2019-05-05 DIAGNOSIS — H43813 Vitreous degeneration, bilateral: Secondary | ICD-10-CM | POA: Diagnosis not present

## 2019-05-05 DIAGNOSIS — H35363 Drusen (degenerative) of macula, bilateral: Secondary | ICD-10-CM | POA: Diagnosis not present

## 2019-05-05 DIAGNOSIS — E113293 Type 2 diabetes mellitus with mild nonproliferative diabetic retinopathy without macular edema, bilateral: Secondary | ICD-10-CM | POA: Diagnosis not present

## 2019-05-05 DIAGNOSIS — H33311 Horseshoe tear of retina without detachment, right eye: Secondary | ICD-10-CM | POA: Diagnosis not present

## 2019-05-14 ENCOUNTER — Ambulatory Visit: Payer: Federal, State, Local not specified - PPO

## 2019-05-22 ENCOUNTER — Ambulatory Visit: Payer: Medicare Other | Attending: Internal Medicine

## 2019-05-22 DIAGNOSIS — Z23 Encounter for immunization: Secondary | ICD-10-CM | POA: Insufficient documentation

## 2019-05-22 NOTE — Progress Notes (Signed)
   Covid-19 Vaccination Clinic  Name:  Vincent Alvarez    MRN: [id number] DOB: 16-Apr-1941  05/22/2019  [name] was observed post Covid-19 immunization for 15 minutes without incidence. He was provided with Vaccine Information Sheet and instruction to access the V-Safe system.   [name] was instructed to call 911 with any severe reactions post vaccine: Marland Kitchen Difficulty breathing  . Swelling of your face and throat  . A fast heartbeat  . A bad rash all over your body  . Dizziness and weakness    Immunizations Administered    Name Date Dose VIS Date Route   Pfizer COVID-19 Vaccine 05/22/2019  9:24 AM 0.3 mL 03/28/2019 Intramuscular   Manufacturer: [place]   Lot: [id number]   [place]: [id number]

## 2019-06-16 ENCOUNTER — Ambulatory Visit: Payer: Medicare Other | Attending: Internal Medicine

## 2019-06-16 DIAGNOSIS — Z23 Encounter for immunization: Secondary | ICD-10-CM | POA: Insufficient documentation

## 2019-06-16 NOTE — Progress Notes (Signed)
   Covid-19 Vaccination Clinic  Name:  Vincent Alvarez    MRN: [id number] DOB: 1940-11-12  06/16/2019  [name] was observed post Covid-19 immunization for 15 minutes without incidence. He was provided with Vaccine Information Sheet and instruction to access the V-Safe system.   [name] was instructed to call 911 with any severe reactions post vaccine: Marland Kitchen Difficulty breathing  . Swelling of your face and throat  . A fast heartbeat  . A bad rash all over your body  . Dizziness and weakness    Immunizations Administered    Name Date Dose VIS Date Route   Pfizer COVID-19 Vaccine 06/16/2019  1:41 PM 0.3 mL 03/28/2019 Intramuscular   Manufacturer: [place]   Lot: [id number]   [place]: [id number]

## 2019-06-30 DIAGNOSIS — E785 Hyperlipidemia, unspecified: Secondary | ICD-10-CM | POA: Diagnosis not present

## 2019-06-30 DIAGNOSIS — E11319 Type 2 diabetes mellitus with unspecified diabetic retinopathy without macular edema: Secondary | ICD-10-CM | POA: Diagnosis not present

## 2019-06-30 DIAGNOSIS — E669 Obesity, unspecified: Secondary | ICD-10-CM | POA: Diagnosis not present

## 2019-06-30 DIAGNOSIS — F324 Major depressive disorder, single episode, in partial remission: Secondary | ICD-10-CM | POA: Diagnosis not present

## 2019-06-30 DIAGNOSIS — Z125 Encounter for screening for malignant neoplasm of prostate: Secondary | ICD-10-CM | POA: Diagnosis not present

## 2019-06-30 DIAGNOSIS — N400 Enlarged prostate without lower urinary tract symptoms: Secondary | ICD-10-CM | POA: Diagnosis not present

## 2019-06-30 DIAGNOSIS — I10 Essential (primary) hypertension: Secondary | ICD-10-CM | POA: Diagnosis not present

## 2019-06-30 DIAGNOSIS — Z0000 Encounter for general adult medical examination without abnormal findings: Secondary | ICD-10-CM | POA: Diagnosis not present

## 2019-06-30 DIAGNOSIS — M179 Osteoarthritis of knee, unspecified: Secondary | ICD-10-CM | POA: Diagnosis not present

## 2019-08-06 ENCOUNTER — Ambulatory Visit: Payer: Medicare Other | Attending: Internal Medicine

## 2019-08-06 DIAGNOSIS — Z20822 Contact with and (suspected) exposure to covid-19: Secondary | ICD-10-CM | POA: Diagnosis not present

## 2019-08-07 DIAGNOSIS — N390 Urinary tract infection, site not specified: Secondary | ICD-10-CM | POA: Diagnosis not present

## 2019-08-07 LAB — SARS-COV-2, NAA 2 DAY TAT

## 2019-08-07 LAB — NOVEL CORONAVIRUS, NAA: SARS-CoV-2, NAA: NOT DETECTED

## 2019-08-08 DIAGNOSIS — R300 Dysuria: Secondary | ICD-10-CM | POA: Diagnosis not present

## 2019-08-25 DIAGNOSIS — R3989 Other symptoms and signs involving the genitourinary system: Secondary | ICD-10-CM | POA: Diagnosis not present

## 2019-11-05 DIAGNOSIS — H353131 Nonexudative age-related macular degeneration, bilateral, early dry stage: Secondary | ICD-10-CM | POA: Diagnosis not present

## 2019-11-05 DIAGNOSIS — H43813 Vitreous degeneration, bilateral: Secondary | ICD-10-CM | POA: Diagnosis not present

## 2019-11-05 DIAGNOSIS — E113293 Type 2 diabetes mellitus with mild nonproliferative diabetic retinopathy without macular edema, bilateral: Secondary | ICD-10-CM | POA: Diagnosis not present

## 2019-11-05 DIAGNOSIS — H31091 Other chorioretinal scars, right eye: Secondary | ICD-10-CM | POA: Diagnosis not present

## 2019-11-05 DIAGNOSIS — H35363 Drusen (degenerative) of macula, bilateral: Secondary | ICD-10-CM | POA: Diagnosis not present

## 2019-12-31 DIAGNOSIS — E785 Hyperlipidemia, unspecified: Secondary | ICD-10-CM | POA: Diagnosis not present

## 2019-12-31 DIAGNOSIS — I10 Essential (primary) hypertension: Secondary | ICD-10-CM | POA: Diagnosis not present

## 2019-12-31 DIAGNOSIS — E11319 Type 2 diabetes mellitus with unspecified diabetic retinopathy without macular edema: Secondary | ICD-10-CM | POA: Diagnosis not present

## 2019-12-31 DIAGNOSIS — Z23 Encounter for immunization: Secondary | ICD-10-CM | POA: Diagnosis not present

## 2020-01-19 DIAGNOSIS — Z23 Encounter for immunization: Secondary | ICD-10-CM | POA: Diagnosis not present

## 2020-02-17 DIAGNOSIS — Z961 Presence of intraocular lens: Secondary | ICD-10-CM | POA: Diagnosis not present

## 2020-02-17 DIAGNOSIS — E119 Type 2 diabetes mellitus without complications: Secondary | ICD-10-CM | POA: Diagnosis not present

## 2020-02-17 DIAGNOSIS — H52203 Unspecified astigmatism, bilateral: Secondary | ICD-10-CM | POA: Diagnosis not present

## 2020-02-25 DIAGNOSIS — M1711 Unilateral primary osteoarthritis, right knee: Secondary | ICD-10-CM | POA: Diagnosis not present

## 2020-02-25 DIAGNOSIS — M25551 Pain in right hip: Secondary | ICD-10-CM | POA: Diagnosis not present

## 2020-02-25 DIAGNOSIS — M545 Low back pain, unspecified: Secondary | ICD-10-CM | POA: Diagnosis not present

## 2020-03-17 DIAGNOSIS — M1711 Unilateral primary osteoarthritis, right knee: Secondary | ICD-10-CM | POA: Diagnosis not present

## 2020-06-16 DIAGNOSIS — M1711 Unilateral primary osteoarthritis, right knee: Secondary | ICD-10-CM | POA: Diagnosis not present

## 2020-07-05 DIAGNOSIS — E785 Hyperlipidemia, unspecified: Secondary | ICD-10-CM | POA: Diagnosis not present

## 2020-07-05 DIAGNOSIS — N400 Enlarged prostate without lower urinary tract symptoms: Secondary | ICD-10-CM | POA: Diagnosis not present

## 2020-07-05 DIAGNOSIS — I10 Essential (primary) hypertension: Secondary | ICD-10-CM | POA: Diagnosis not present

## 2020-07-05 DIAGNOSIS — E11319 Type 2 diabetes mellitus with unspecified diabetic retinopathy without macular edema: Secondary | ICD-10-CM | POA: Diagnosis not present

## 2020-07-05 DIAGNOSIS — F324 Major depressive disorder, single episode, in partial remission: Secondary | ICD-10-CM | POA: Diagnosis not present

## 2020-07-05 DIAGNOSIS — Z0000 Encounter for general adult medical examination without abnormal findings: Secondary | ICD-10-CM | POA: Diagnosis not present

## 2020-07-05 DIAGNOSIS — G4700 Insomnia, unspecified: Secondary | ICD-10-CM | POA: Diagnosis not present

## 2020-08-02 DIAGNOSIS — K4090 Unilateral inguinal hernia, without obstruction or gangrene, not specified as recurrent: Secondary | ICD-10-CM | POA: Diagnosis not present

## 2020-08-03 ENCOUNTER — Other Ambulatory Visit: Payer: Self-pay | Admitting: Family Medicine

## 2020-08-03 DIAGNOSIS — K4090 Unilateral inguinal hernia, without obstruction or gangrene, not specified as recurrent: Secondary | ICD-10-CM

## 2020-08-04 ENCOUNTER — Other Ambulatory Visit: Payer: Self-pay

## 2020-08-04 ENCOUNTER — Ambulatory Visit
Admission: RE | Admit: 2020-08-04 | Discharge: 2020-08-04 | Disposition: A | Discharge status: Home / Self Care | Payer: Medicare Other | Source: Ambulatory Visit | Attending: Family Medicine | Admitting: Family Medicine

## 2020-08-04 DIAGNOSIS — R1031 Right lower quadrant pain: Secondary | ICD-10-CM | POA: Diagnosis not present

## 2020-08-04 DIAGNOSIS — K4090 Unilateral inguinal hernia, without obstruction or gangrene, not specified as recurrent: Secondary | ICD-10-CM

## 2020-08-18 DIAGNOSIS — K4090 Unilateral inguinal hernia, without obstruction or gangrene, not specified as recurrent: Secondary | ICD-10-CM | POA: Diagnosis not present

## 2020-09-01 DIAGNOSIS — R1031 Right lower quadrant pain: Secondary | ICD-10-CM | POA: Diagnosis not present

## 2020-09-09 ENCOUNTER — Other Ambulatory Visit: Payer: Self-pay | Admitting: Surgery

## 2020-09-09 DIAGNOSIS — K4090 Unilateral inguinal hernia, without obstruction or gangrene, not specified as recurrent: Secondary | ICD-10-CM

## 2020-09-21 ENCOUNTER — Ambulatory Visit: Payer: Medicare Other | Attending: Internal Medicine

## 2020-09-21 ENCOUNTER — Other Ambulatory Visit (HOSPITAL_BASED_OUTPATIENT_CLINIC_OR_DEPARTMENT_OTHER): Payer: Self-pay

## 2020-09-21 ENCOUNTER — Other Ambulatory Visit: Payer: Self-pay

## 2020-09-21 DIAGNOSIS — Z23 Encounter for immunization: Secondary | ICD-10-CM

## 2020-09-21 MED ORDER — PFIZER-BIONT COVID-19 VAC-TRIS 30 MCG/0.3ML IM SUSP
INTRAMUSCULAR | 0 refills | Status: DC
  Filled 2020-09-21: qty 0.3, 1d supply, fill #0

## 2020-09-21 NOTE — Progress Notes (Signed)
   Covid-19 Vaccination Clinic  Name:  Vincent Alvarez    MRN: [id number] DOB: 1940-07-27  09/21/2020  [name] was observed post Covid-19 immunization for 15 minutes without incident. He was provided with Vaccine Information Sheet and instruction to access the V-Safe system.   [name] was instructed to call 911 with any severe reactions post vaccine: Marland Kitchen Difficulty breathing  . Swelling of face and throat  . A fast heartbeat  . A bad rash all over body  . Dizziness and weakness   Immunizations Administered    Name Date Dose VIS Date Route   PFIZER Comrnaty([place] TOP) Covid-19 Vaccine 09/21/2020 10:56 AM 0.3 mL 03/25/2020 Intramuscular   Manufacturer: [place]   Lot: [id number]   [place]: [phone]

## 2020-09-23 ENCOUNTER — Other Ambulatory Visit: Payer: Self-pay

## 2020-09-23 ENCOUNTER — Ambulatory Visit
Admission: RE | Admit: 2020-09-23 | Discharge: 2020-09-23 | Disposition: A | Discharge status: Home / Self Care | Payer: Medicare Other | Source: Ambulatory Visit | Attending: Surgery | Admitting: Surgery

## 2020-09-23 DIAGNOSIS — K449 Diaphragmatic hernia without obstruction or gangrene: Secondary | ICD-10-CM | POA: Diagnosis not present

## 2020-09-23 DIAGNOSIS — K4090 Unilateral inguinal hernia, without obstruction or gangrene, not specified as recurrent: Secondary | ICD-10-CM

## 2020-09-29 ENCOUNTER — Telehealth: Payer: Self-pay | Admitting: Surgery

## 2020-09-29 NOTE — Telephone Encounter (Signed)
Discussed CT results with patient.  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  The left side is asymptomatic, the pain on the right side has significantly diminished but does occur occasionally with certain movements.  We discussed options of proceeding with robotic/laparoscopic repair, versus observation and the pros and cons of each.  At this time he wishes to hold off.  We will schedule him to follow-up with me in about 6 months to reassess symptoms, sooner if needed.

## 2020-11-04 DIAGNOSIS — M545 Low back pain, unspecified: Secondary | ICD-10-CM | POA: Diagnosis not present

## 2020-11-04 DIAGNOSIS — S7001XA Contusion of right hip, initial encounter: Secondary | ICD-10-CM | POA: Diagnosis not present

## 2020-11-22 ENCOUNTER — Other Ambulatory Visit: Payer: Self-pay

## 2020-11-22 ENCOUNTER — Emergency Department (HOSPITAL_BASED_OUTPATIENT_CLINIC_OR_DEPARTMENT_OTHER): Payer: Medicare Other

## 2020-11-22 ENCOUNTER — Observation Stay (HOSPITAL_BASED_OUTPATIENT_CLINIC_OR_DEPARTMENT_OTHER)
Admission: EM | Admit: 2020-11-22 | Discharge: 2020-11-24 | Disposition: A | Discharge status: Home / Self Care | Payer: Medicare Other | Attending: Internal Medicine | Admitting: Internal Medicine

## 2020-11-22 ENCOUNTER — Encounter (HOSPITAL_BASED_OUTPATIENT_CLINIC_OR_DEPARTMENT_OTHER): Payer: Self-pay | Admitting: Emergency Medicine

## 2020-11-22 DIAGNOSIS — Z7984 Long term (current) use of oral hypoglycemic drugs: Secondary | ICD-10-CM | POA: Insufficient documentation

## 2020-11-22 DIAGNOSIS — E785 Hyperlipidemia, unspecified: Secondary | ICD-10-CM | POA: Diagnosis present

## 2020-11-22 DIAGNOSIS — I10 Essential (primary) hypertension: Secondary | ICD-10-CM | POA: Diagnosis not present

## 2020-11-22 DIAGNOSIS — Z79899 Other long term (current) drug therapy: Secondary | ICD-10-CM | POA: Diagnosis not present

## 2020-11-22 DIAGNOSIS — Z87891 Personal history of nicotine dependence: Secondary | ICD-10-CM | POA: Diagnosis not present

## 2020-11-22 DIAGNOSIS — I639 Cerebral infarction, unspecified: Principal | ICD-10-CM | POA: Insufficient documentation

## 2020-11-22 DIAGNOSIS — E119 Type 2 diabetes mellitus without complications: Secondary | ICD-10-CM

## 2020-11-22 DIAGNOSIS — E11319 Type 2 diabetes mellitus with unspecified diabetic retinopathy without macular edema: Secondary | ICD-10-CM | POA: Diagnosis not present

## 2020-11-22 DIAGNOSIS — Z20822 Contact with and (suspected) exposure to covid-19: Secondary | ICD-10-CM | POA: Diagnosis not present

## 2020-11-22 DIAGNOSIS — D500 Iron deficiency anemia secondary to blood loss (chronic): Secondary | ICD-10-CM | POA: Insufficient documentation

## 2020-11-22 DIAGNOSIS — D649 Anemia, unspecified: Secondary | ICD-10-CM | POA: Diagnosis present

## 2020-11-22 DIAGNOSIS — R471 Dysarthria and anarthria: Secondary | ICD-10-CM | POA: Diagnosis not present

## 2020-11-22 DIAGNOSIS — R200 Anesthesia of skin: Secondary | ICD-10-CM | POA: Diagnosis present

## 2020-11-22 DIAGNOSIS — Z7982 Long term (current) use of aspirin: Secondary | ICD-10-CM | POA: Diagnosis not present

## 2020-11-22 DIAGNOSIS — G459 Transient cerebral ischemic attack, unspecified: Secondary | ICD-10-CM

## 2020-11-22 DIAGNOSIS — R29818 Other symptoms and signs involving the nervous system: Secondary | ICD-10-CM | POA: Diagnosis not present

## 2020-11-22 LAB — RESP PANEL BY RT-PCR (FLU A&B, COVID) ARPGX2
Influenza A by PCR: NEGATIVE
Influenza B by PCR: NEGATIVE
SARS Coronavirus 2 by RT PCR: NEGATIVE

## 2020-11-22 LAB — DIFFERENTIAL
Abs Immature Granulocytes: 0.02 10*3/uL (ref 0.00–0.07)
Basophils Absolute: 0.1 10*3/uL (ref 0.0–0.1)
Basophils Relative: 1 %
Eosinophils Absolute: 0.1 10*3/uL (ref 0.0–0.5)
Eosinophils Relative: 2 %
Immature Granulocytes: 0 %
Lymphocytes Relative: 22 %
Lymphs Abs: 1.9 10*3/uL (ref 0.7–4.0)
Monocytes Absolute: 0.7 10*3/uL (ref 0.1–1.0)
Monocytes Relative: 8 %
Neutro Abs: 5.9 10*3/uL (ref 1.7–7.7)
Neutrophils Relative %: 67 %

## 2020-11-22 LAB — COMPREHENSIVE METABOLIC PANEL
ALT: 14 U/L (ref 0–44)
AST: 15 U/L (ref 15–41)
Albumin: 4.3 g/dL (ref 3.5–5.0)
Alkaline Phosphatase: 79 U/L (ref 38–126)
Anion gap: 9 (ref 5–15)
BUN: 19 mg/dL (ref 8–23)
CO2: 26 mmol/L (ref 22–32)
Calcium: 9.2 mg/dL (ref 8.9–10.3)
Chloride: 99 mmol/L (ref 98–111)
Creatinine, Ser: 1.29 mg/dL — ABNORMAL HIGH (ref 0.61–1.24)
GFR, Estimated: 56 mL/min — ABNORMAL LOW (ref >60)
Glucose, Bld: 97 mg/dL (ref 70–99)
Potassium: 4.5 mmol/L (ref 3.5–5.1)
Sodium: 134 mmol/L — ABNORMAL LOW (ref 135–145)
Total Bilirubin: 0.6 mg/dL (ref 0.3–1.2)
Total Protein: 6.8 g/dL (ref 6.5–8.1)

## 2020-11-22 LAB — CBC
HCT: 39.3 % (ref 39.0–52.0)
Hemoglobin: 12.9 g/dL — ABNORMAL LOW (ref 13.0–17.0)
MCH: 29.8 pg (ref 26.0–34.0)
MCHC: 32.8 g/dL (ref 30.0–36.0)
MCV: 90.8 fL (ref 80.0–100.0)
Platelets: 235 10*3/uL (ref 150–400)
RBC: 4.33 MIL/uL (ref 4.22–5.81)
RDW: 13.8 % (ref 11.5–15.5)
WBC: 8.7 10*3/uL (ref 4.0–10.5)
nRBC: 0 % (ref 0.0–0.2)

## 2020-11-22 LAB — CBG MONITORING, ED
Glucose-Capillary: 103 mg/dL — ABNORMAL HIGH (ref 70–99)
Glucose-Capillary: 127 mg/dL — ABNORMAL HIGH (ref 70–99)

## 2020-11-22 LAB — PROTIME-INR
INR: 1 (ref 0.8–1.2)
Prothrombin Time: 12.8 seconds (ref 11.4–15.2)

## 2020-11-22 LAB — APTT: aPTT: 31 seconds (ref 24–36)

## 2020-11-22 MED ORDER — SODIUM CHLORIDE 0.9% FLUSH
3.0000 mL | Freq: Once | INTRAVENOUS | Status: AC
  Administered 2020-11-22: 3 mL via INTRAVENOUS
  Filled 2020-11-22: qty 3

## 2020-11-22 MED ORDER — ASPIRIN EC 81 MG PO TBEC
81.0000 mg | DELAYED_RELEASE_TABLET | Freq: Every day | ORAL | Status: DC
  Administered 2020-11-23 – 2020-11-24 (×2): 81 mg via ORAL
  Filled 2020-11-22 (×2): qty 1

## 2020-11-22 MED ORDER — ASPIRIN 325 MG PO TABS
650.0000 mg | ORAL_TABLET | Freq: Once | ORAL | Status: AC
  Administered 2020-11-22: 650 mg via ORAL
  Filled 2020-11-22: qty 2

## 2020-11-22 MED ORDER — CLOPIDOGREL BISULFATE 75 MG PO TABS
75.0000 mg | ORAL_TABLET | Freq: Every day | ORAL | Status: DC
  Administered 2020-11-23 – 2020-11-24 (×2): 75 mg via ORAL
  Filled 2020-11-22 (×2): qty 1

## 2020-11-22 NOTE — ED Provider Notes (Signed)
[facility] EMERGENC[facility] DEPT Provider Note   CSN: [id number] Arrival date & time: 11/22/20  1445     History Chief Complaint  Patient presents with   Aphasia    LEROYAL LARAMORE is a 80 y.o. male.  He is a history of diabetes hypertension and says he has a TIA in the past.  He is coming in with acute onset of right hand and right face numbness and possibly some slurred speech that occurred about an hour prior to arrival.  Says the symptoms lasted only about 5 minutes.  Triage nurse thought he still had some slurred speech and he says he was having trouble controlling his secretions in triage.  He denies any focal numbness or weakness blurry vision double vision.  He takes a daily aspirin.  Denies any recent falls.  The history is provided by the patient and the spouse.  Cerebrovascular Accident This is a new problem. The current episode started less than 1 hour ago. The problem has been resolved. Pertinent negatives include no chest pain, no abdominal pain, no headaches and no shortness of breath. Nothing aggravates the symptoms. Nothing relieves the symptoms. He has tried nothing for the symptoms. The treatment provided significant relief.      Past Medical History:  Diagnosis Date   Atypical chest pain    Depression    Diabetes mellitus without complication ([place])    Diabetic retinopathy ([place])    Gout    Hearing loss    Hyperlipidemia    Hypertension    Onychomycosis    UTI (lower urinary tract infection)     Patient Active Problem List   Diagnosis Date Noted   TIA (transient ischemic attack) 12/18/2015   Diabetes mellitus, type 2 ([place]) 12/18/2015   Essential hypertension 12/18/2015   Hyperlipidemia 12/18/2015   Depression 12/18/2015    Past Surgical History:  Procedure Laterality Date   CATARACT EXTRACTION, BILATERAL     CHOLECYSTECTOMY     COLONOSCOPY     TONSILLECTOMY         Family History  Problem Relation Age of Onset   CAD Mother 25    Colon cancer Father 20    Social History   Tobacco Use   Smoking status: Former    Types: Cigarettes    Quit date: 06/18/1985    Years since quitting: 35.4   Smokeless tobacco: Never  Substance Use Topics   Alcohol use: Yes    Alcohol/week: 2.0 standard drinks    Types: 2 Glasses of wine per week    Comment: thinks he may be drinking too much, 2/4 + CAGE questions   Drug use: No    Home Medications Prior to Admission medications   Medication Sig Start Date End Date Taking? Authorizing Provider  amLODipine (NORVASC) 5 MG tablet Take 5 mg by mouth every morning. 12/05/15   [provider]  aspirin 325 MG tablet Take 1 tablet (325 mg total) by mouth daily. 12/20/15   Johnson, Clanford L, MD  atorvastatin (LIPITOR) 20 MG tablet Take 20 mg by mouth every morning.     [provider]  B Complex Vitamins (B COMPLEX PO) Take 1 tablet by mouth daily.    [provider]  COVID-19 mRNA Vac-TriS, Pfizer, (PFIZER-BIONT COVID-19 VAC-TRIS) SUSP injection Inject into the muscle. 09/21/20   Carlyle Basques, MD  FLUoxetine (PROZAC) 20 MG capsule Take 20 mg by mouth every morning.     [provider]  fluticasone (FLONASE) 50 MCG/ACT nasal  spray Place 1 spray into both nostrils daily as needed for allergies.  09/14/15   [provider]  metFORMIN (GLUCOPHAGE) 500 MG tablet Take 1,000 mg by mouth 2 (two) times daily with a meal.     [provider]  ramipril (ALTACE) 10 MG capsule Take 10 mg by mouth daily.    [provider]  sitaGLIPtin (JANUVIA) 100 MG tablet Take 100 mg by mouth daily.    [provider]  tamsulosin (FLOMAX) 0.4 MG CAPS capsule Take 0.4 mg by mouth daily after supper.     [provider]  vitamin B-12 (CYANOCOBALAMIN) 250 MCG tablet Take 250 mcg by mouth daily.    [provider]    Allergies    Shrimp [shellfish allergy]  Review of Systems   Review of Systems  Constitutional:  Negative for  fever.  HENT:  Negative for sore throat.   Eyes:  Negative for visual disturbance.  Respiratory:  Negative for shortness of breath.   Cardiovascular:  Negative for chest pain.  Gastrointestinal:  Negative for abdominal pain.  Genitourinary:  Negative for dysuria.  Musculoskeletal:  Negative for neck pain.  Skin:  Negative for rash.  Neurological:  Positive for speech difficulty and numbness. Negative for weakness and headaches.   Physical Exam Updated Vital Signs BP 133/66 (BP Location: Right Arm)   Pulse 75   Temp 98.3 F (36.8 C) (Oral)   Resp 18   Ht '5\' 8"'$  (1.727 m)   Wt 103 kg   SpO2 99%   BMI 34.53 kg/m   Physical Exam Vitals and nursing note reviewed.  Constitutional:      Appearance: Normal appearance. He is well-developed.  HENT:     Head: Normocephalic and atraumatic.  Eyes:     Conjunctiva/sclera: Conjunctivae normal.  Cardiovascular:     Rate and Rhythm: Normal rate and regular rhythm.     Heart sounds: No murmur heard. Pulmonary:     Effort: Pulmonary effort is normal. No respiratory distress.     Breath sounds: Normal breath sounds.  Abdominal:     Palpations: Abdomen is soft.     Tenderness: There is no abdominal tenderness. There is no guarding or rebound.  Musculoskeletal:        General: No deformity or signs of injury. Normal range of motion.     Cervical back: Neck supple.  Skin:    General: Skin is warm and dry.  Neurological:     General: No focal deficit present.     Mental Status: He is alert.     Cranial Nerves: No cranial nerve deficit.     Sensory: No sensory deficit.     Motor: No weakness.    ED Results / Procedures / Treatments   Labs (all labs ordered are listed, but only abnormal results are displayed) Labs Reviewed  CBC - Abnormal; Notable for the following components:      Result Value   Hemoglobin 12.9 (*)    All other components within normal limits  COMPREHENSIVE METABOLIC PANEL - Abnormal; Notable for the following  components:   Sodium 134 (*)    Creatinine, Ser 1.29 (*)    GFR, Estimated 56 (*)    All other components within normal limits  CBG MONITORING, ED - Abnormal; Notable for the following components:   Glucose-Capillary 103 (*)    All other components within normal limits  CBG MONITORING, ED - Abnormal; Notable for the following components:   Glucose-Capillary 127 (*)  All other components within normal limits  RESP PANEL BY RT-PCR (FLU A&B, COVID) ARPGX2  PROTIME-INR  APTT  DIFFERENTIAL    EKG EKG Interpretation  Date/Time:  Monday November 22 2020 15:10:32 EDT Ventricular Rate:  77 PR Interval:  150 QRS Duration: 134 QT Interval:  416 QTC Calculation: 471 R Axis:   264 Text Interpretation: Sinus rhythm RBBB and LAFB No significant change since prior 9/17 Confirmed by Aletta Edouard [phone]) on 11/22/2020 3:30:50 PM  Radiology CT HEAD CODE STROKE WO CONTRAST  Result Date: 11/22/2020 CLINICAL DATA:  Code stroke.  Neuro deficit, acute, stroke suspected EXAM: CT HEAD WITHOUT CONTRAST TECHNIQUE: Contiguous axial images were obtained from the base of the skull through the vertex without intravenous contrast. COMPARISON:  CT head 12/18/2015. FINDINGS: Brain: No evidence of acute large vascular territory infarction, hemorrhage, hydrocephalus, extra-axial collection or mass lesion/mass effect.New left thalamocapsular hypodensities. Additional patchy advanced white matter hypoattenuation, likely related to chronic microvascular ischemic disease. Moderate atrophy. Vascular: No hyperdense vessel identified. Calcific intracranial atherosclerosis. Skull: No acute fracture. Sinuses/Orbits: Visualized sinuses are clear. Other: No mastoid effusions. IMPRESSION: 1. New small left thalamocapsular hypodensities. Although favored remote, acute infarct is not excluded by CT. An MRI could better evaluate if clinically indicated. 2. No acute hemorrhage. 3. Advanced chronic microvascular ischemic disease.  Findings discussed with Dr. Melina Copa via telephone at 3:08 p.m. Electronically Signed   By: Margaretha Sheffield MD   On: 11/22/2020 15:14    Procedures .Critical Care  Date/Time: 11/23/2020 10:26 AM Performed by: Hayden Rasmussen, MD Authorized by: Hayden Rasmussen, MD   Critical care provider statement:    Critical care time (minutes):  45   Critical care time was exclusive of:  Separately billable procedures and treating other patients   Critical care was necessary to treat or prevent imminent or life-threatening deterioration of the following conditions:  CNS failure or compromise   Critical care was time spent personally by me on the following activities:  Discussions with consultants, evaluation of patient's response to treatment, examination of patient, ordering and performing treatments and interventions, ordering and review of laboratory studies, ordering and review of radiographic studies, pulse oximetry, re-evaluation of patient's condition, obtaining history from patient or surrogate, review of old charts and development of treatment plan with patient or surrogate   Medications Ordered in ED Medications  clopidogrel (PLAVIX) tablet 75 mg (75 mg Oral Given 11/23/20 1019)  aspirin EC tablet 81 mg (81 mg Oral Given 11/23/20 1018)  sodium chloride flush (NS) 0.9 % injection 3 mL (3 mLs Intravenous Given 11/22/20 1507)  aspirin tablet 650 mg (650 mg Oral Given 11/22/20 1539)    ED Course  I have reviewed the triage vital signs and the nursing notes.  Pertinent labs & imaging results that were available during my care of the patient were reviewed by me and considered in my medical decision making (see chart for details).  Clinical Course as of 11/23/20 1026  Mon Nov 22, 2020  [facility] Neurologist Dr. Cheral Marker evaluating patient via remote.  Offered patient tPA.  Patient declined tPA. [MB]  57 Dr. Cheral Marker recommending patient be admitted to [facility] for continued TIA stroke work-up with an MRI. [MB]   1531 Discussed with Dr. Rogers Blocker [facility] hospitalist who will put the patient in for a bed at [facility]. [MB]    Clinical Course User Index [MB] Hayden Rasmussen, MD   MDM Rules/Calculators/A&P  This patient complains of right hand and face numbness, possible speech difficulty; this involves an extensive number of treatment Options and is a complaint that carries with it a high risk of complications and Morbidity. The differential includes stroke, TIA, metabolic derangement, hypoglycemia, hypertensive emergency, bleed  I ordered, reviewed and interpreted labs, which included CBC with normal white count, hemoglobin slightly lower than baseline, chemistries fairly normal other than elevated creatinine, COVID testing negative I ordered medication aspirin I ordered imaging studies which included CT head and I independently    visualized and interpreted imaging which showed possible new infarct Additional history obtained from patient's wife Previous records obtained and reviewed in epic, no recent admissions I consulted teleneurology Dr. Cheral Marker and discussed lab and imaging findings  Critical Interventions: Emergent evaluation for possible tPA candidate stroke  After the interventions stated above, I reevaluated the patient and found patient to be fairly asymptomatic.  Neurology offered tPA which the patient and wife declined.  Neurology also agreed that the benefit probably was not worth the risk for his low level of deficit.  Patient will need admission to the hospital for further stroke work-up.   Final Clinical Impression(s) / ED Diagnoses Final diagnoses:  Acute CVA (cerebrovascular accident) [facility])    Rx / [place] Orders ED Discharge Orders     None        Hayden Rasmussen, MD 11/23/20 1030

## 2020-11-22 NOTE — Consult Note (Signed)
[facility] TeleNeurology Consult Services    Date of Service:  11/22/2020    Impression: Acute onset of dysphasia, most likely secondary to small left MCA stroke.    Metrics: Last Known Well: 2:15 PM Symptoms: As per HPI.  Patient is a candidate for thrombolytic; after detailed discussion of risks/benefits, the patient refused tPA administration   Location of the provider: [facility]  Location of the patient: [facility] Pre-Morbid Modified Rankin Scale: 0  This consult was provided via telemedicine with 2-way video and audio communication. The patient/family was informed that care would be provided in this way and agreed to receive care in this manner.   ED Physician notified of diagnostic impression and management plan at: 3:28 PM   Assessment: 80 year old male with a prior history of stroke in 2017, presenting with transient symptoms of dysarthria, dysphasia and right hand numbness.  -Exam reveals decreased FT sensation on the left, mild dysarthria and mild dysphasia. Overall presentation is most consistent with an acute ischemic stroke which is most likely small given his relatively mild deficits.  -CT head: New small left thalamocapsular hypodensities. Although favored remote, acute infarct is not excluded by CT. No acute hemorrhage. Advanced chronic microvascular ischemic disease -The patient has no absolute contraindications to tPA. He is a candidate for thrombolytic; after detailed discussion of risks/benefits, the patient refused tPA administration  Wife expressed agreement with husband's decision. All questions answered.  - Classifiable as having failed ASA monotherapy - Stroke risk factors: Prior stroke, DM, HLD and HTN.      Recommendations: - Will need full stroke work up to include MRI brain, CTA of head and neck, TTE and cardiac telemetry. - Modified permissive HTN protocol given advanced age. Treat SBP if > 180. After 24 hours,  normalize SBP to goal of 120-140.  - ASA 650 mg crushed x 1 now.  - Add Plavix 75 mg po qd to ASA.  - HgbA1c, fasting lipid panel - PT consult, OT consult, Speech consult - Frequent neuro checks - NPO until passes stroke swallow screen - Being transferred to [facility].     ------------------------------------------------------------------------------   History of Present Illness: The patient is an 80 year old male with a history of stroke (2017), atypical chest pain, DM, diabetic retinopathy, gout, HLD and HTN who presents to the [facility] ED with after an acute episode of slurred speech, mild word finding difficulty and right hand numbness that lasted for 1 hour. His wife states that he suddenly started to have a slurred quality to his speech in addition to "slobbering".  LKN was 2:15 PM. His prior stroke in 2017 involved similar speech deficits as well as right sided weakness.   Initial BP in the ED was 146/87.  Home medications include ASA and atorvastatin.   CT head: 1. New small left thalamocapsular hypodensities. Although favored remote, acute infarct is not excluded by CT. An MRI could better evaluate if clinically indicated. 2. No acute hemorrhage. 3. Advanced chronic microvascular ischemic disease.  EKG:  Sinus rhythm RBBB and LAFB       Past Medical History: Past Medical History:  Diagnosis Date   Atypical chest pain    Depression    Diabetes mellitus without complication ([place])    Diabetic retinopathy ([place])    Gout    Hearing loss    Hyperlipidemia    Hypertension    Onychomycosis    UTI (lower urinary tract infection)  Past Surgical History: Past Surgical History:  Procedure Laterality Date   CATARACT EXTRACTION, BILATERAL     CHOLECYSTECTOMY     COLONOSCOPY     TONSILLECTOMY        Medications:  No current facility-administered medications on file prior to encounter.   Current Outpatient Medications on File Prior to Encounter   Medication Sig Dispense Refill   amLODipine (NORVASC) 5 MG tablet Take 5 mg by mouth every morning.     aspirin 325 MG tablet Take 1 tablet (325 mg total) by mouth daily. 30 tablet 0   atorvastatin (LIPITOR) 20 MG tablet Take 20 mg by mouth every morning.      B Complex Vitamins (B COMPLEX PO) Take 1 tablet by mouth daily.     COVID-19 mRNA Vac-TriS, Pfizer, (PFIZER-BIONT COVID-19 VAC-TRIS) SUSP injection Inject into the muscle. 0.3 mL 0   FLUoxetine (PROZAC) 20 MG capsule Take 20 mg by mouth every morning.      fluticasone (FLONASE) 50 MCG/ACT nasal spray Place 1 spray into both nostrils daily as needed for allergies.      metFORMIN (GLUCOPHAGE) 500 MG tablet Take 1,000 mg by mouth 2 (two) times daily with a meal.      ramipril (ALTACE) 10 MG capsule Take 10 mg by mouth daily.     sitaGLIPtin (JANUVIA) 100 MG tablet Take 100 mg by mouth daily.     tamsulosin (FLOMAX) 0.4 MG CAPS capsule Take 0.4 mg by mouth daily after supper.      vitamin B-12 (CYANOCOBALAMIN) 250 MCG tablet Take 250 mcg by mouth daily.        Social History: 2 glasses of wine per week.  No drug use.  Former smoker   Family History:  CAD and colon cancer   ROS: As per HPI    Anticoagulant use:  No   Antiplatelet use: ASA   Examination:     1A: Level of Consciousness - 0 1B: Ask Month and Age - 0 1C: Blink Eyes & Squeeze Hands - 0 2: Test Horizontal Extraocular Movements - 0 3: Test Visual Fields - 0 4: Test Facial Palsy (Use Grimace if Obtunded) - 0 5A: Test Left Arm Motor Drift - 0 5B: Test Right Arm Motor Drift - 0 6A: Test Left Leg Motor Drift - 0 6B: Test Right Leg Motor Drift - 0 7: Test Limb Ataxia (FNF/Heel-Shin) - 0 8: Test Sensation -  1 (left arm and leg with decreased FT sensation) 9: Test Language/Aphasia - 1 10: Test Dysarthria - Severe Dysarthria: 1 11: Test Extinction/Inattention - Extinction to bilateral simultaneous stimulation 0   NIHSS Score: 3     Patient/Family was informed  the Neurology Consult would occur via TeleHealth consult by way of interactive audio and video telecommunications and consented to receiving care in this manner.   Patient is being evaluated for possible acute neurologic impairment and high pretest probability of imminent or life-threatening deterioration. I spent total of 40 minutes providing care to this patient, including time for face to face visit via telemedicine, review of medical records, imaging studies and discussion of findings with providers, the patient and/or family.   Electronically signed: Dr. Kerney Elbe

## 2020-11-22 NOTE — ED Notes (Signed)
Pt ambulatory to restroom

## 2020-11-22 NOTE — ED Notes (Signed)
Pt ambulated to bathroom with minimal assistance.

## 2020-11-22 NOTE — ED Notes (Signed)
Patient transported to CT 

## 2020-11-22 NOTE — Progress Notes (Signed)
Received a phone call from Facility: [facility]  Requesting MD: Dr. Melina Copa  Patient with h/o  presenting with stroke like symptoms of slurred speech, right face numbness and right hand numbness.  Hx of TIA. Pertinent labs: creatinine: 1.29. CTH with new small left thalamocapsular hypodensities. Possible remote, but acute infarct can not be excluded. Needs MRI so [facility]. Vitals stable, on room air and blood pressure stable. Neurology was consulted and will follow. We will call when arrived.  Plan of care: admit to tele. Call neurology when arrives.  The patient will be accepted for admission to telemetry at [facility] when bed is available.    Nursing staff, Please call the [facility] number at the top of Amion at the time of the patient's arrival so that the patient can be paged to the admitting physician.   Casimer Bilis, M.D. [facility]

## 2020-11-22 NOTE — ED Triage Notes (Signed)
Slurred speech and R hand numbness x 1 hour. Hx of TIA

## 2020-11-23 ENCOUNTER — Observation Stay (HOSPITAL_COMMUNITY): Payer: Medicare Other

## 2020-11-23 ENCOUNTER — Encounter (HOSPITAL_COMMUNITY): Payer: Self-pay | Admitting: Family Medicine

## 2020-11-23 ENCOUNTER — Other Ambulatory Visit (HOSPITAL_BASED_OUTPATIENT_CLINIC_OR_DEPARTMENT_OTHER): Payer: Self-pay | Admitting: Emergency Medicine

## 2020-11-23 DIAGNOSIS — Z7982 Long term (current) use of aspirin: Secondary | ICD-10-CM | POA: Diagnosis not present

## 2020-11-23 DIAGNOSIS — R471 Dysarthria and anarthria: Secondary | ICD-10-CM | POA: Diagnosis not present

## 2020-11-23 DIAGNOSIS — G319 Degenerative disease of nervous system, unspecified: Secondary | ICD-10-CM | POA: Diagnosis not present

## 2020-11-23 DIAGNOSIS — E11319 Type 2 diabetes mellitus with unspecified diabetic retinopathy without macular edema: Secondary | ICD-10-CM | POA: Diagnosis not present

## 2020-11-23 DIAGNOSIS — D500 Iron deficiency anemia secondary to blood loss (chronic): Secondary | ICD-10-CM | POA: Diagnosis not present

## 2020-11-23 DIAGNOSIS — D649 Anemia, unspecified: Secondary | ICD-10-CM | POA: Diagnosis present

## 2020-11-23 DIAGNOSIS — I639 Cerebral infarction, unspecified: Secondary | ICD-10-CM | POA: Diagnosis not present

## 2020-11-23 DIAGNOSIS — I672 Cerebral atherosclerosis: Secondary | ICD-10-CM | POA: Diagnosis not present

## 2020-11-23 DIAGNOSIS — E785 Hyperlipidemia, unspecified: Secondary | ICD-10-CM

## 2020-11-23 DIAGNOSIS — Q283 Other malformations of cerebral vessels: Secondary | ICD-10-CM | POA: Diagnosis not present

## 2020-11-23 DIAGNOSIS — Z7984 Long term (current) use of oral hypoglycemic drugs: Secondary | ICD-10-CM | POA: Diagnosis not present

## 2020-11-23 DIAGNOSIS — Z79899 Other long term (current) drug therapy: Secondary | ICD-10-CM | POA: Diagnosis not present

## 2020-11-23 DIAGNOSIS — I10 Essential (primary) hypertension: Secondary | ICD-10-CM | POA: Diagnosis not present

## 2020-11-23 DIAGNOSIS — R200 Anesthesia of skin: Secondary | ICD-10-CM

## 2020-11-23 DIAGNOSIS — Z87891 Personal history of nicotine dependence: Secondary | ICD-10-CM | POA: Diagnosis not present

## 2020-11-23 DIAGNOSIS — Z20822 Contact with and (suspected) exposure to covid-19: Secondary | ICD-10-CM | POA: Diagnosis not present

## 2020-11-23 DIAGNOSIS — I63233 Cerebral infarction due to unspecified occlusion or stenosis of bilateral carotid arteries: Secondary | ICD-10-CM | POA: Diagnosis not present

## 2020-11-23 HISTORY — DX: Cerebral infarction, unspecified: I63.9

## 2020-11-23 LAB — CBC WITH DIFFERENTIAL/PLATELET
Abs Immature Granulocytes: 0.01 10*3/uL (ref 0.00–0.07)
Basophils Absolute: 0 10*3/uL (ref 0.0–0.1)
Basophils Relative: 1 %
Eosinophils Absolute: 0.1 10*3/uL (ref 0.0–0.5)
Eosinophils Relative: 1 %
HCT: 39.5 % (ref 39.0–52.0)
Hemoglobin: 12.8 g/dL — ABNORMAL LOW (ref 13.0–17.0)
Immature Granulocytes: 0 %
Lymphocytes Relative: 19 %
Lymphs Abs: 1.2 10*3/uL (ref 0.7–4.0)
MCH: 29.7 pg (ref 26.0–34.0)
MCHC: 32.4 g/dL (ref 30.0–36.0)
MCV: 91.6 fL (ref 80.0–100.0)
Monocytes Absolute: 0.5 10*3/uL (ref 0.1–1.0)
Monocytes Relative: 8 %
Neutro Abs: 4.6 10*3/uL (ref 1.7–7.7)
Neutrophils Relative %: 71 %
Platelets: 257 10*3/uL (ref 150–400)
RBC: 4.31 MIL/uL (ref 4.22–5.81)
RDW: 13.6 % (ref 11.5–15.5)
WBC: 6.5 10*3/uL (ref 4.0–10.5)
nRBC: 0 % (ref 0.0–0.2)

## 2020-11-23 LAB — BASIC METABOLIC PANEL
Anion gap: 10 (ref 5–15)
BUN: 19 mg/dL (ref 8–23)
CO2: 27 mmol/L (ref 22–32)
Calcium: 9.3 mg/dL (ref 8.9–10.3)
Chloride: 97 mmol/L — ABNORMAL LOW (ref 98–111)
Creatinine, Ser: 1.42 mg/dL — ABNORMAL HIGH (ref 0.61–1.24)
GFR, Estimated: 50 mL/min — ABNORMAL LOW (ref >60)
Glucose, Bld: 254 mg/dL — ABNORMAL HIGH (ref 70–99)
Potassium: 3.8 mmol/L (ref 3.5–5.1)
Sodium: 134 mmol/L — ABNORMAL LOW (ref 135–145)

## 2020-11-23 LAB — GLUCOSE, CAPILLARY
Glucose-Capillary: 234 mg/dL — ABNORMAL HIGH (ref 70–99)
Glucose-Capillary: 48 mg/dL — ABNORMAL LOW (ref 70–99)
Glucose-Capillary: 128 mg/dL — ABNORMAL HIGH (ref 70–99)

## 2020-11-23 LAB — CBG MONITORING, ED: Glucose-Capillary: 174 mg/dL — ABNORMAL HIGH (ref 70–99)

## 2020-11-23 MED ORDER — SODIUM CHLORIDE 0.9 % IV SOLN: INTRAVENOUS | Status: DC

## 2020-11-23 MED ORDER — STROKE: EARLY STAGES OF RECOVERY BOOK
Freq: Once | Status: AC
  Filled 2020-11-23: qty 1

## 2020-11-23 MED ORDER — FLUTICASONE PROPIONATE 50 MCG/ACT NA SUSP
1.0000 | Freq: Every day | NASAL | Status: DC | PRN
  Filled 2020-11-23: qty 16

## 2020-11-23 MED ORDER — INSULIN ASPART 100 UNIT/ML IJ SOLN: 0.0000 [IU] | Freq: Three times a day (TID) | INTRAMUSCULAR | Status: DC

## 2020-11-23 MED ORDER — ACETAMINOPHEN 160 MG/5ML PO SOLN: 650.0000 mg | ORAL | Status: DC | PRN

## 2020-11-23 MED ORDER — TRAZODONE HCL 50 MG PO TABS
50.0000 mg | ORAL_TABLET | Freq: Every day | ORAL | Status: DC
  Administered 2020-11-23: 50 mg via ORAL
  Filled 2020-11-23: qty 1

## 2020-11-23 MED ORDER — ACETAMINOPHEN 650 MG RE SUPP: 650.0000 mg | RECTAL | Status: DC | PRN

## 2020-11-23 MED ORDER — SENNOSIDES-DOCUSATE SODIUM 8.6-50 MG PO TABS: 1.0000 | ORAL_TABLET | Freq: Every evening | ORAL | Status: DC | PRN

## 2020-11-23 MED ORDER — ACETAMINOPHEN 325 MG PO TABS: 650.0000 mg | ORAL_TABLET | ORAL | Status: DC | PRN

## 2020-11-23 MED ORDER — INSULIN ASPART 100 UNIT/ML IJ SOLN: 0.0000 [IU] | Freq: Every day | INTRAMUSCULAR | Status: DC

## 2020-11-23 MED ORDER — IOHEXOL 350 MG/ML SOLN
60.0000 mL | Freq: Once | INTRAVENOUS | Status: AC | PRN
  Administered 2020-11-23: 60 mL via INTRAVENOUS

## 2020-11-23 MED ORDER — INSULIN ASPART 100 UNIT/ML IJ SOLN
0.0000 [IU] | Freq: Three times a day (TID) | INTRAMUSCULAR | Status: DC
  Administered 2020-11-23: 5 [IU] via SUBCUTANEOUS
  Administered 2020-11-24: 3 [IU] via SUBCUTANEOUS
  Administered 2020-11-24: 2 [IU] via SUBCUTANEOUS

## 2020-11-23 MED ORDER — ATORVASTATIN CALCIUM 10 MG PO TABS
20.0000 mg | ORAL_TABLET | Freq: Every day | ORAL | Status: DC
  Administered 2020-11-23 – 2020-11-24 (×2): 20 mg via ORAL
  Filled 2020-11-23 (×2): qty 2

## 2020-11-23 MED ORDER — ENOXAPARIN SODIUM 40 MG/0.4ML IJ SOSY
40.0000 mg | PREFILLED_SYRINGE | INTRAMUSCULAR | Status: DC
  Administered 2020-11-23: 40 mg via SUBCUTANEOUS
  Filled 2020-11-23: qty 0.4

## 2020-11-23 MED ORDER — TAMSULOSIN HCL 0.4 MG PO CAPS
0.4000 mg | ORAL_CAPSULE | Freq: Every day | ORAL | Status: DC
  Administered 2020-11-23: 0.4 mg via ORAL
  Filled 2020-11-23: qty 1

## 2020-11-23 MED ORDER — FLUOXETINE HCL 20 MG PO CAPS
20.0000 mg | ORAL_CAPSULE | Freq: Every day | ORAL | Status: DC
  Administered 2020-11-23 – 2020-11-24 (×2): 20 mg via ORAL
  Filled 2020-11-23 (×2): qty 1

## 2020-11-23 NOTE — Evaluation (Signed)
Physical Therapy Evaluation Patient Details Name: Vincent Alvarez MRN: [id number] DOB: Apr 30, 1940 Today's Date: 11/23/2020   History of Present Illness  Pt is 80 yo who presented on 11/22/20 with transient symptoms of dysarthroia, dysphasia, and R hand numbness.  CT Head revealed New small left thalamocapsular hypodensities-Although favored remote, acute infarct is not excluded by CT. Pt with hx of CVA in 2017, DM, diabetic retinopathy, gout, HLD< and HTN.  Clinical Impression  Pt admitted with above diagnosis.  He was able to ambulate 300' and perform stairs.  Demonstrates equal strength bilaterally with good coordination.  DGI score of 20/24 indicating lower fall risk. Pt demonstrated good safety awareness and was able to navigate in room safely pushing IV pole to bathroom.  Pt feels that his mobility is near baseline - states he still feels he has difficulty completing sentences at times, but otherwise back to normal. No further acute PT indicated. Safe to mobilize in room independently from PT perspective.     Follow Up Recommendations No PT follow up    Equipment Recommendations  None recommended by PT    Recommendations for Other Services       Precautions / Restrictions Precautions Precautions: None      Mobility  Bed Mobility Overal bed mobility: Independent                  Transfers Overall transfer level: Needs assistance   Transfers: Sit to/from Stand Sit to Stand: Supervision         General transfer comment: Had supervision but performed safely  Ambulation/Gait Ambulation/Gait assistance: Supervision Gait Distance (Feet): 300 Feet Assistive device: None;IV Pole Gait Pattern/deviations: Step-through pattern;Decreased stride length Gait velocity: normal   General Gait Details: Pt with normal gait pattern without LOB.  He was able to ambulate without AD and by pushing IV pole  Stairs            Wheelchair Mobility    Modified Rankin (Stroke  Patients Only) Modified Rankin (Stroke Patients Only) Pre-Morbid Rankin Score: No symptoms Modified Rankin: Slight disability     Balance Overall balance assessment: Needs assistance   Sitting balance-Leahy Scale: Normal       Standing balance-Leahy Scale: Normal                   Standardized Balance Assessment Standardized Balance Assessment : Dynamic Gait Index   Dynamic Gait Index Level Surface: Normal Change in Gait Speed: Normal Gait with Horizontal Head Turns: Mild Impairment Gait with Vertical Head Turns: Mild Impairment Gait and Pivot Turn: Normal Step Over Obstacle: Mild Impairment Step Around Obstacles: Normal Steps: Mild Impairment Total Score: 20       Pertinent Vitals/Pain Pain Assessment: No/denies pain    Home Living Family/patient expects to be discharged to:: Private residence Living Arrangements: Spouse/significant other Available Help at Discharge: Family;Available 24 hours/day Type of Home: House Home Access: Stairs to enter Entrance Stairs-Rails: Right Entrance Stairs-Number of Steps: 3 Home Layout: One level Home Equipment: Grab bars - tub/shower;Cane - single point      Prior Function Level of Independence: Independent with assistive device(s)         Comments: Reports independent with ADLS, IADLs, Driving, and community ambulation.  Reports occasionally uses a cane if his knees are giving him a lot of pain     Hand Dominance        Extremity/Trunk Assessment   Upper Extremity Assessment Upper Extremity Assessment: Overall WFL for tasks assessed  Lower Extremity Assessment Lower Extremity Assessment: Overall WFL for tasks assessed (ROM WFL, MMT 5/5 , coordination WNL heel/shin)    Cervical / Trunk Assessment Cervical / Trunk Assessment: Normal  Communication   Communication: HOH  Cognition Arousal/Alertness: Awake/alert Behavior During Therapy: WFL for tasks assessed/performed Overall Cognitive Status: Within  Functional Limits for tasks assessed                                        General Comments      Exercises     Assessment/Plan    PT Assessment Patent does not need any further PT services  PT Problem List         PT Treatment Interventions      PT Goals (Current goals can be found in the Care Plan section)  Acute Rehab PT Goals Patient Stated Goal: return home PT Goal Formulation: All assessment and education complete, DC therapy    Frequency     Barriers to discharge        Co-evaluation               AM-PAC PT "6 Clicks" Mobility  Outcome Measure Help needed turning from your back to your side while in a flat bed without using bedrails?: None Help needed moving from lying on your back to sitting on the side of a flat bed without using bedrails?: None Help needed moving to and from a bed to a chair (including a wheelchair)?: None Help needed standing up from a chair using your arms (e.g., wheelchair or bedside chair)?: None Help needed to walk in hospital room?: None Help needed climbing 3-5 steps with a railing? : A Little 6 Click Score: 23    End of Session Equipment Utilized During Treatment: Gait belt Activity Tolerance: Patient tolerated treatment well Patient left: in bed;Other (comment) (radiology present and taking pt for imaging) Nurse Communication: Mobility status      Time: [id number] PT Time Calculation (min) (ACUTE ONLY): 20 min   Charges:   PT Evaluation $PT Eval Low Complexity: 1 Low          Vincent Alvarez, PT Acute Rehab Services Pager [phone] [facility] [phone]   Karlton Lemon 11/23/2020, 4:29 PM

## 2020-11-23 NOTE — H&P (Signed)
History and Physical    Vincent Alvarez [id number] DOB: [birth year] DOA: 11/22/2020  Referring MD/NP/PA: Orma Flaming, MD PCP: Lawerance Cruel, MD  Patient coming from: Transfer from [facility]  Chief Complaint: Right hand and face numbness  I have personally briefly reviewed patient's old medical records in [place]   HPI: Vincent Alvarez is a 80 y.o. right-handed male with medical history significant of hypertension, hyperlipidemia, diabetes mellitus type 2, and TIA who presents with right hand and face numbness.  Symptoms started yesterday afternoon around 2:30 PM while he was shopping in the grocery store.  Patient reports that he may have noticed some mild weakness on his right side.  He states feeling confused at that time and was not able to remember how to use his cell phone. Patient noted that he was drooling, speech was slurred, and had trouble finding his words. 3 days ago while he was at home reading book he temporarily reported that the word on the pages got all jumbled up and was unable to read.  Patient denies having any recent fever, cough, chest pain, shortness of breath, nausea, vomiting, or falls.  Symptoms seem to be improving after 1 hour, but at this time patient still reports some difficulty with his words and his speech is still not back to his normal.  Patient reported having TIA back in 2017 which affected his right hand and he temporarily had difficulty reading, but MRI did not show any acute abnormalities.  ED Course: Patient was seen to be afebrile, pulse 58-78, blood pressures 133/66 ~158/81, and all other vital signs maintained.  CT scan of the brain showed a new small left thalamocapsular hypodensities thought to be remote, but an acute infarct was not excluded.  He had been evaluated by telemetry neurology and was a candidate for tPA, but refused administration after being explained risk and benefits.  Labs from 8/8 significant for  hemoglobin 12.9 and sodium 134.  Influenza and COVID-19 screening were negative.  Patient had been given 650 mg of aspirin and started on Plavix.  Review of Systems  Constitutional:  Negative for fever and malaise/fatigue.  HENT:  Positive for hearing loss. Negative for congestion.   Eyes:  Negative for photophobia and pain.  Respiratory:  Negative for cough and shortness of breath.   Cardiovascular:  Negative for chest pain and leg swelling.  Gastrointestinal:  Negative for abdominal pain and vomiting.  Genitourinary:  Negative for dysuria and frequency.  Musculoskeletal:  Negative for falls.  Skin:  Negative for rash.  Neurological:  Positive for sensory change, speech change and focal weakness.  Psychiatric/Behavioral:  Positive for memory loss.    Past Medical History:  Diagnosis Date   Atypical chest pain    Depression    Diabetes mellitus without complication ([place])    Diabetic retinopathy ([place])    Gout    Hearing loss    Hyperlipidemia    Hypertension    Onychomycosis    UTI (lower urinary tract infection)     Past Surgical History:  Procedure Laterality Date   CATARACT EXTRACTION, BILATERAL     CHOLECYSTECTOMY     COLONOSCOPY     TONSILLECTOMY       reports that he quit smoking about 35 years ago. He has never used smokeless tobacco. He reports current alcohol use of about 2.0 standard drinks of alcohol per week. He reports that he does not use drugs.  Allergies  Allergen Reactions  Shrimp [Shellfish Allergy] Nausea And Vomiting    Family History  Problem Relation Age of Onset   CAD Mother 28   Colon cancer Father 52    Prior to Admission medications   Medication Sig Start Date End Date Taking? Authorizing Provider  amLODipine (NORVASC) 5 MG tablet Take 5 mg by mouth every morning. 12/05/15  Yes [provider]  aspirin 325 MG tablet Take 1 tablet (325 mg total) by mouth daily. 12/20/15  Yes Johnson, Clanford L, MD  atorvastatin (LIPITOR) 20 MG  tablet Take 20 mg by mouth every morning.    Yes [provider]  B Complex Vitamins (B COMPLEX PO) Take 1 tablet by mouth daily.   Yes [provider]  FLUoxetine (PROZAC) 20 MG capsule Take 20 mg by mouth every morning.    Yes [provider]  fluticasone (FLONASE) 50 MCG/ACT nasal spray Place 1 spray into both nostrils daily as needed for allergies.  09/14/15  Yes [provider]  metFORMIN (GLUCOPHAGE) 500 MG tablet Take 1,000 mg by mouth 2 (two) times daily with a meal.    Yes [provider]  ramipril (ALTACE) 10 MG capsule Take 10 mg by mouth daily.   Yes [provider]  sitaGLIPtin (JANUVIA) 100 MG tablet Take 100 mg by mouth daily.   Yes [provider]  tamsulosin (FLOMAX) 0.4 MG CAPS capsule Take 0.4 mg by mouth daily after supper.    Yes [provider]  traZODone (DESYREL) 50 MG tablet Take 50 mg by mouth at bedtime.   Yes [provider]  vitamin B-12 (CYANOCOBALAMIN) 250 MCG tablet Take 250 mcg by mouth daily.   Yes [provider]  COVID-19 mRNA Vac-TriS, Pfizer, (PFIZER-BIONT COVID-19 VAC-TRIS) SUSP injection Inject into the muscle. 09/21/20   Carlyle Basques, MD  losartan (COZAAR) 100 MG tablet Take 100 mg by mouth daily. 10/30/20   [provider]    Physical Exam:  Constitutional: Elderly male currently no acute distress Vitals:   11/23/20 0731 11/23/20 1000 11/23/20 1213 11/23/20 1440  BP: (!) 148/82 (!) 146/74 (!) 158/81 138/74  Pulse: 65 72 65 78  Resp: '18 17 16 16  '$ Temp: 98.5 F (36.9 C)  98.3 F (36.8 C) 98.6 F (37 C)  TempSrc: Oral  Oral Oral  SpO2: 98% 95% 95% 96%  Weight:      Height:       Eyes: PERRL, lids and conjunctivae normal ENMT: Mucous membranes are moist.  Posterior pharynx clear of any exudate or lesions.  Neck: normal, supple, no masses, no thyromegaly Respiratory: clear to auscultation bilaterally, no wheezing, no crackles. Normal respiratory  effort. No accessory muscle use.  Cardiovascular: Regular rate and rhythm, no murmurs / rubs / gallops. No extremity edema. 2+ pedal pulses. No carotid bruits.  Abdomen: no tenderness, no masses palpated. No hepatosplenomegaly. Bowel sounds positive.  Musculoskeletal: no clubbing / cyanosis. No joint deformity upper and lower extremities. Good ROM, no contractures. Normal muscle tone.  Skin: no rashes, lesions, ulcers. No induration Neurologic: CN 2-12 grossly intact.  Slurred speech.  Strength 5/5 in all 4 extremities Psychiatric: Normal judgment and insight. Alert and oriented x 3. Normal mood.     Labs on Admission: I have personally reviewed following labs and imaging studies  CBC: Recent Labs  Lab 11/22/20 1512  WBC 8.7  NEUTROABS 5.9  HGB 12.9*  HCT 39.3  MCV 90.8  PLT [id number]   Basic Metabolic Panel: Recent Labs  Lab  11/22/20 1512  NA 134*  K 4.5  CL 99  CO2 26  GLUCOSE 97  BUN 19  CREATININE 1.29*  CALCIUM 9.2   GFR: Estimated Creatinine Clearance: 52.2 mL/min (A) (by C-G formula based on SCr of 1.29 mg/dL (H)). Liver Function Tests: Recent Labs  Lab 11/22/20 1512  AST 15  ALT 14  ALKPHOS 79  BILITOT 0.6  PROT 6.8  ALBUMIN 4.3   No results for input(s): LIPASE, AMYLASE in the last 168 hours. No results for input(s): AMMONIA in the last 168 hours. Coagulation Profile: Recent Labs  Lab 11/22/20 1512  INR 1.0   Cardiac Enzymes: No results for input(s): CKTOTAL, CKMB, CKMBINDEX, TROPONINI in the last 168 hours. BNP (last 3 results) No results for input(s): PROBNP in the last 8760 hours. HbA1C: No results for input(s): HGBA1C in the last 72 hours. CBG: Recent Labs  Lab 11/22/20 1448 11/22/20 2352 11/23/20 1028  GLUCAP 103* 127* 174*   Lipid Profile: No results for input(s): CHOL, HDL, LDLCALC, TRIG, CHOLHDL, LDLDIRECT in the last 72 hours. Thyroid Function Tests: No results for input(s): TSH, T4TOTAL, FREET4, T3FREE, THYROIDAB in the last 72  hours. Anemia Panel: No results for input(s): VITAMINB12, FOLATE, FERRITIN, TIBC, IRON, RETICCTPCT in the last 72 hours. Urine analysis: No results found for: COLORURINE, APPEARANCEUR, LABSPEC, PHURINE, GLUCOSEU, HGBUR, BILIRUBINUR, KETONESUR, PROTEINUR, UROBILINOGEN, NITRITE, LEUKOCYTESUR Sepsis Labs: Recent Results (from the past 240 hour(s))  Resp Panel by RT-PCR (Flu A&B, Covid) Nasopharyngeal Swab     Status: None   Collection Time: 11/22/20  3:41 PM   Specimen: Nasopharyngeal Swab; Nasopharyngeal(NP) swabs in vial transport medium  Result Value Ref Range Status   SARS Coronavirus 2 by RT PCR NEGATIVE NEGATIVE Final    Comment: (NOTE) SARS-CoV-2 target nucleic acids are NOT DETECTED.  The SARS-CoV-2 RNA is generally detectable in upper respiratory specimens during the acute phase of infection. The lowest concentration of SARS-CoV-2 viral copies this assay can detect is 138 copies/mL. A negative result does not preclude SARS-Cov-2 infection and should not be used as the sole basis for treatment or other patient management decisions. A negative result may occur with  improper specimen collection/handling, submission of specimen other than nasopharyngeal swab, presence of viral mutation(s) within the areas targeted by this assay, and inadequate number of viral copies(<138 copies/mL). A negative result must be combined with clinical observations, patient history, and epidemiological information. The expected result is Negative.  Fact Sheet for Patients:  EntrepreneurPulse.com.au  Fact Sheet for Healthcare Providers:  IncredibleEmployment.be  This test is no t yet approved or cleared by the [place] FDA and  has been authorized for detection and/or diagnosis of SARS-CoV-2 by FDA under an Emergency Use Authorization (EUA). This EUA will remain  in effect (meaning this test can be used) for the duration of the COVID-19 declaration under  Section 564(b)(1) of the Act, 21 U.S.C.section 360bbb-3(b)(1), unless the authorization is terminated  or revoked sooner.       Influenza A by PCR NEGATIVE NEGATIVE Final   Influenza B by PCR NEGATIVE NEGATIVE Final    Comment: (NOTE) The Xpert Xpress SARS-CoV-2/FLU/RSV plus assay is intended as an aid in the diagnosis of influenza from Nasopharyngeal swab specimens and should not be used as a sole basis for treatment. Nasal washings and aspirates are unacceptable for Xpert Xpress SARS-CoV-2/FLU/RSV testing.  Fact Sheet for Patients: EntrepreneurPulse.com.au  Fact Sheet for Healthcare Providers: IncredibleEmployment.be  This test is not yet approved or cleared by the [place]  States FDA and has been authorized for detection and/or diagnosis of SARS-CoV-2 by FDA under an Emergency Use Authorization (EUA). This EUA will remain in effect (meaning this test can be used) for the duration of the COVID-19 declaration under Section 564(b)(1) of the Act, 21 U.S.C. section 360bbb-3(b)(1), unless the authorization is terminated or revoked.  Performed at [facility], [address], [place], [place] [address]      Radiological Exams on Admission: CT HEAD CODE STROKE WO CONTRAST  Result Date: 11/22/2020 CLINICAL DATA:  Code stroke.  Neuro deficit, acute, stroke suspected EXAM: CT HEAD WITHOUT CONTRAST TECHNIQUE: Contiguous axial images were obtained from the base of the skull through the vertex without intravenous contrast. COMPARISON:  CT head 12/18/2015. FINDINGS: Brain: No evidence of acute large vascular territory infarction, hemorrhage, hydrocephalus, extra-axial collection or mass lesion/mass effect.New left thalamocapsular hypodensities. Additional patchy advanced white matter hypoattenuation, likely related to chronic microvascular ischemic disease. Moderate atrophy. Vascular: No hyperdense vessel identified. Calcific intracranial  atherosclerosis. Skull: No acute fracture. Sinuses/Orbits: Visualized sinuses are clear. Other: No mastoid effusions. IMPRESSION: 1. New small left thalamocapsular hypodensities. Although favored remote, acute infarct is not excluded by CT. An MRI could better evaluate if clinically indicated. 2. No acute hemorrhage. 3. Advanced chronic microvascular ischemic disease. Findings discussed with Dr. Melina Copa via telephone at 3:08 p.m. Electronically Signed   By: Margaretha Sheffield MD   On: 11/22/2020 15:14    EKG: Independently reviewed.  Sinus rhythm at 77 bpm no significant change from previous EKGs  Assessment/Plan Dysarthria and right-sided paresthesias suspected CVA: Acute.  Patient presents with right-sided numbness and difficulty speaking yesterday around 2:30 PM.  CT scan significant for new small left thalamocapsular hypodensities that were thought to be more likely remote in nature.  He had been evaluated by telemetry neurology and given symptoms was a candidate for tPA, but after risks and benefits patient declined.  Risk factors include prior TIA, HTN, HLD, and DM type II. -Admit to telemetry bed -Stroke order set initiated -Neuro checks -Check  MRI  w/o contrast  -CTA of the head and neck with and without contrast -PT/OT/Speech to eval and treat -Check echocardiogram -Check Hemoglobin A1c and lipid panel in a.m. -ASA and Plavix -Appreciate neurology consultative services, will follow-up   Essential hypertension: Blood pressures are currently controlled.  Home blood pressure regimen includes amlodipine 5 g daily, losartan 100 mg daily, and ramipril 10 mg daily. -Allow for permissive hypertension -Resume home blood pressure medications when medically appropriate  Acute kidney injury: Creatinine elevated up to 1.42 on repeat labs today.  Patient baseline creatinine appears to have been within normal limits. -Normal saline IV fluids at 75 mL/h  Diabetes mellitus type 2: On admission  glucose elevated up to 254.  Home medications included metformin and Januvia.  Last hemoglobin A1c 7.3 in 2017. -Hypoglycemic protocols -Hold metformin and Januvia -CBGs before every meal and at bedtime with moderate SSI  Normocytic anemia: Hemoglobin 12.9 g/dL on admission.  No reports of bleeding. -Continue to monitor  Left lower lobe lung nodule: Seen on chest x-ray.  Noncontrast CT recommended for further evaluation  Hyperlipidemia -Check lipid panel -Continue atorvastatin  History of depression -Continue Prozac  BPH -Continue Flomax  DNR: Present on admission  DVT prophylaxis: lovenox  Code Status: Full  Family Communication: message left on wife's voicemail Disposition Plan: To be determined Consults called: Neurology Admission status: Observation, follow-up for further days  Norval Morton MD [facility]   If  7PM-7AM, please contact night-coverage   11/23/2020, 2:56 PM

## 2020-11-23 NOTE — ED Notes (Signed)
Diet refreshment provided to pt.

## 2020-11-23 NOTE — Plan of Care (Signed)
  Problem: Education: Goal: Knowledge of General Education information will improve Description: Including pain rating scale, medication(s)/side effects and non-pharmacologic comfort measures 11/23/2020 1853 by Cassell Smiles, RN Outcome: Progressing 11/23/2020 1847 by Cassell Smiles, RN Outcome: Progressing   Problem: Health Behavior/Discharge Planning: Goal: Ability to manage health-related needs will improve 11/23/2020 1853 by Cassell Smiles, RN Outcome: Progressing 11/23/2020 1847 by Cassell Smiles, RN Outcome: Progressing   Problem: Clinical Measurements: Goal: Ability to maintain clinical measurements within normal limits will improve 11/23/2020 1853 by Cassell Smiles, RN Outcome: Progressing 11/23/2020 1847 by Cassell Smiles, RN Outcome: Progressing Goal: Will remain free from infection 11/23/2020 1853 by Cassell Smiles, RN Outcome: Progressing 11/23/2020 1847 by Cassell Smiles, RN Outcome: Progressing Goal: Diagnostic test results will improve 11/23/2020 1853 by Cassell Smiles, RN Outcome: Progressing 11/23/2020 1847 by Cassell Smiles, RN Outcome: Progressing Goal: Respiratory complications will improve 11/23/2020 1853 by Cassell Smiles, RN Outcome: Progressing 11/23/2020 1847 by Cassell Smiles, RN Outcome: Progressing Goal: Cardiovascular complication will be avoided 11/23/2020 1853 by Cassell Smiles, RN Outcome: Progressing 11/23/2020 1847 by Cassell Smiles, RN Outcome: Progressing   Problem: Activity: Goal: Risk for activity intolerance will decrease 11/23/2020 1853 by Cassell Smiles, RN Outcome: Progressing 11/23/2020 1847 by Cassell Smiles, RN Outcome: Progressing   Problem: Nutrition: Goal: Adequate nutrition will be maintained 11/23/2020 1853 by Cassell Smiles, RN Outcome: Progressing 11/23/2020 1847 by Cassell Smiles, RN Outcome: Progressing   Problem: Coping: Goal: Level of anxiety will decrease 11/23/2020 1853 by Cassell Smiles, RN Outcome:  Progressing 11/23/2020 1847 by Cassell Smiles, RN Outcome: Progressing   Problem: Elimination: Goal: Will not experience complications related to bowel motility 11/23/2020 1853 by Cassell Smiles, RN Outcome: Progressing 11/23/2020 1847 by Cassell Smiles, RN Outcome: Progressing Goal: Will not experience complications related to urinary retention 11/23/2020 1853 by Cassell Smiles, RN Outcome: Progressing 11/23/2020 1847 by Cassell Smiles, RN Outcome: Progressing   Problem: Pain Managment: Goal: General experience of comfort will improve 11/23/2020 1853 by Cassell Smiles, RN Outcome: Progressing 11/23/2020 1847 by Cassell Smiles, RN Outcome: Progressing   Problem: Safety: Goal: Ability to remain free from injury will improve 11/23/2020 1853 by Cassell Smiles, RN Outcome: Progressing 11/23/2020 1847 by Cassell Smiles, RN Outcome: Progressing   Problem: Skin Integrity: Goal: Risk for impaired skin integrity will decrease 11/23/2020 1853 by Cassell Smiles, RN Outcome: Progressing 11/23/2020 1847 by Cassell Smiles, RN Outcome: Progressing   Problem: Education: Goal: Knowledge of disease or condition will improve Outcome: Progressing Goal: Knowledge of secondary prevention will improve Outcome: Progressing Goal: Knowledge of patient specific risk factors addressed and post discharge goals established will improve Outcome: Progressing Goal: Individualized Educational Video(s) Outcome: Progressing   Problem: Coping: Goal: Will verbalize positive feelings about self Outcome: Progressing Goal: Will identify appropriate support needs Outcome: Progressing   Problem: Health Behavior/Discharge Planning: Goal: Ability to manage health-related needs will improve Outcome: Progressing   Problem: Nutrition: Goal: Risk of aspiration will decrease Outcome: Progressing Goal: Dietary intake will improve Outcome: Progressing

## 2020-11-23 NOTE — Plan of Care (Signed)

## 2020-11-24 ENCOUNTER — Observation Stay (HOSPITAL_BASED_OUTPATIENT_CLINIC_OR_DEPARTMENT_OTHER): Payer: Medicare Other

## 2020-11-24 DIAGNOSIS — Q283 Other malformations of cerebral vessels: Secondary | ICD-10-CM | POA: Diagnosis not present

## 2020-11-24 DIAGNOSIS — I639 Cerebral infarction, unspecified: Secondary | ICD-10-CM | POA: Diagnosis not present

## 2020-11-24 DIAGNOSIS — G319 Degenerative disease of nervous system, unspecified: Secondary | ICD-10-CM | POA: Diagnosis not present

## 2020-11-24 DIAGNOSIS — I63233 Cerebral infarction due to unspecified occlusion or stenosis of bilateral carotid arteries: Secondary | ICD-10-CM | POA: Diagnosis not present

## 2020-11-24 DIAGNOSIS — G459 Transient cerebral ischemic attack, unspecified: Secondary | ICD-10-CM | POA: Diagnosis not present

## 2020-11-24 DIAGNOSIS — R471 Dysarthria and anarthria: Secondary | ICD-10-CM | POA: Diagnosis not present

## 2020-11-24 DIAGNOSIS — I672 Cerebral atherosclerosis: Secondary | ICD-10-CM | POA: Diagnosis not present

## 2020-11-24 LAB — BASIC METABOLIC PANEL
Anion gap: 6 (ref 5–15)
BUN: 16 mg/dL (ref 8–23)
CO2: 28 mmol/L (ref 22–32)
Calcium: 9.2 mg/dL (ref 8.9–10.3)
Chloride: 103 mmol/L (ref 98–111)
Creatinine, Ser: 1.05 mg/dL (ref 0.61–1.24)
GFR, Estimated: >60 mL/min (ref >60)
Glucose, Bld: 165 mg/dL — ABNORMAL HIGH (ref 70–99)
Potassium: 4 mmol/L (ref 3.5–5.1)
Sodium: 137 mmol/L (ref 135–145)

## 2020-11-24 LAB — ECHOCARDIOGRAM COMPLETE
Area-P 1/2: 2.75 cm2
Height: 68 in
S' Lateral: 2.9 cm
Weight: 3633.18 oz

## 2020-11-24 LAB — GLUCOSE, CAPILLARY
Glucose-Capillary: 130 mg/dL — ABNORMAL HIGH (ref 70–99)
Glucose-Capillary: 138 mg/dL — ABNORMAL HIGH (ref 70–99)
Glucose-Capillary: 196 mg/dL — ABNORMAL HIGH (ref 70–99)

## 2020-11-24 LAB — LIPID PANEL
Cholesterol: 109 mg/dL (ref 0–200)
HDL: 49 mg/dL (ref >40)
LDL Cholesterol: 47 mg/dL (ref 0–99)
Total CHOL/HDL Ratio: 2.2 RATIO
Triglycerides: 67 mg/dL (ref <150)
VLDL: 13 mg/dL (ref 0–40)

## 2020-11-24 LAB — HEMOGLOBIN A1C
Hgb A1c MFr Bld: 7.5 % — ABNORMAL HIGH (ref 4.8–5.6)
Mean Plasma Glucose: 168.55 mg/dL

## 2020-11-24 MED ORDER — ASPIRIN 81 MG PO TBEC: 81.0000 mg | DELAYED_RELEASE_TABLET | Freq: Every day | ORAL | 0 refills | Status: AC

## 2020-11-24 MED ORDER — CLOPIDOGREL BISULFATE 75 MG PO TABS: 75.0000 mg | ORAL_TABLET | Freq: Every day | ORAL | 1 refills | Status: AC

## 2020-11-24 NOTE — Plan of Care (Signed)
  Problem: Education: Goal: Knowledge of General Education information will improve Description: Including pain rating scale, medication(s)/side effects and non-pharmacologic comfort measures Outcome: Adequate for Discharge   Problem: Health Behavior/Discharge Planning: Goal: Ability to manage health-related needs will improve Outcome: Adequate for Discharge   Problem: Clinical Measurements: Goal: Ability to maintain clinical measurements within normal limits will improve Outcome: Adequate for Discharge Goal: Will remain free from infection Outcome: Adequate for Discharge Goal: Diagnostic test results will improve Outcome: Adequate for Discharge Goal: Respiratory complications will improve Outcome: Adequate for Discharge Goal: Cardiovascular complication will be avoided Outcome: Adequate for Discharge   Problem: Activity: Goal: Risk for activity intolerance will decrease Outcome: Adequate for Discharge   Problem: Nutrition: Goal: Adequate nutrition will be maintained Outcome: Adequate for Discharge   Problem: Coping: Goal: Level of anxiety will decrease Outcome: Adequate for Discharge   Problem: Elimination: Goal: Will not experience complications related to bowel motility Outcome: Adequate for Discharge Goal: Will not experience complications related to urinary retention Outcome: Adequate for Discharge   Problem: Pain Managment: Goal: General experience of comfort will improve Outcome: Adequate for Discharge   Problem: Safety: Goal: Ability to remain free from injury will improve Outcome: Adequate for Discharge   Problem: Education: Goal: Knowledge of disease or condition will improve Outcome: Adequate for Discharge Goal: Knowledge of secondary prevention will improve Outcome: Adequate for Discharge Goal: Knowledge of patient specific risk factors addressed and post discharge goals established will improve Outcome: Adequate for Discharge Goal: Individualized  Educational Video(s) Outcome: Adequate for Discharge   Problem: Coping: Goal: Will verbalize positive feelings about self Outcome: Adequate for Discharge Goal: Will identify appropriate support needs Outcome: Adequate for Discharge   Problem: Health Behavior/Discharge Planning: Goal: Ability to manage health-related needs will improve Outcome: Adequate for Discharge   Problem: Self-Care: Goal: Ability to participate in self-care as condition permits will improve Outcome: Adequate for Discharge Goal: Verbalization of feelings and concerns over difficulty with self-care will improve Outcome: Adequate for Discharge Goal: Ability to communicate needs accurately will improve Outcome: Adequate for Discharge   Problem: Nutrition: Goal: Risk of aspiration will decrease Outcome: Adequate for Discharge Goal: Dietary intake will improve Outcome: Adequate for Discharge   Problem: Ischemic Stroke/TIA Tissue Perfusion: Goal: Complications of ischemic stroke/TIA will be minimized Outcome: Adequate for Discharge

## 2020-11-24 NOTE — Evaluation (Signed)
Occupational Therapy Evaluation Patient Details Name: Vincent Alvarez MRN: [id number] DOB: 1940-10-23 Today's Date: 11/24/2020    History of Present Illness Pt is 80 yo who presented on 11/22/20 with transient symptoms of dysarthroia, dysphasia, and R hand numbness.  CT Head revealed New small left thalamocapsular hypodensities-Although favored remote, acute infarct is not excluded by CT. Pt with hx of CVA in 2017, DM, diabetic retinopathy, gout, HLD< and HTN.   Clinical Impression   Patient admitted for the above diagnosis.  Patient endorses resolution of R hand weakness, and mild work finding deficits.  Able to make needs known.  Patient is scheduled for discharge later today.  He will have the assist needed at home via his spouse.  No acute or post acute OT needs identified.      Follow Up Recommendations  No OT follow up;Supervision - Intermittent    Equipment Recommendations  None recommended by OT    Recommendations for Other Services       Precautions / Restrictions Precautions Precautions: None Restrictions Weight Bearing Restrictions: No      Mobility Bed Mobility Overal bed mobility: Independent               Patient Response: Cooperative  Transfers Overall transfer level: Modified independent                    Balance Overall balance assessment: Mild deficits observed, not formally tested                                         ADL either performed or assessed with clinical judgement   ADL Overall ADL's : At baseline                                       General ADL Comments: able to complete ADL from sit stand level without assist.  Independent with in room mobility.     Vision Patient Visual Report: No change from baseline       Perception     Praxis      Pertinent Vitals/Pain Pain Assessment: No/denies pain     Hand Dominance Right   Extremity/Trunk Assessment Upper Extremity  Assessment Upper Extremity Assessment: Overall WFL for tasks assessed   Lower Extremity Assessment Lower Extremity Assessment: Overall WFL for tasks assessed   Cervical / Trunk Assessment Cervical / Trunk Assessment: Normal   Communication Communication Communication: HOH   Cognition Arousal/Alertness: Awake/alert Behavior During Therapy: WFL for tasks assessed/performed Overall Cognitive Status: Within Functional Limits for tasks assessed                                                      Home Living Family/patient expects to be discharged to:: Private residence Living Arrangements: Spouse/significant other Available Help at Discharge: Family;Available 24 hours/day Type of Home: House Home Access: Stairs to enter [employer] of Steps: 3 Entrance Stairs-Rails: Right Home Layout: One level     Bathroom Shower/Tub: Tub/shower unit;Walk-in shower   Bathroom Toilet: Standard     Home Equipment: Grab bars - tub/shower;Cane - single point  Prior Functioning/Environment Level of Independence: Independent with assistive device(s)        Comments: Per chart, and confirmed by patient: Reports independent with ADLS, IADLs, Driving, and community ambulation.  Reports occasionally uses a cane if his knees are giving him a lot of pain        OT Problem List: Decreased activity tolerance      OT Treatment/Interventions:      OT Goals(Current goals can be found in the care plan section) Acute Rehab OT Goals Patient Stated Goal: return home OT Goal Formulation: With patient Time For Goal Achievement: 11/24/20 Potential to Achieve Goals: Good  OT Frequency:     Barriers to D/C:  None noted          Co-evaluation              AM-PAC OT "6 Clicks" Daily Activity     Outcome Measure Help from another person eating meals?: None Help from another person taking care of personal grooming?: None Help from another person  toileting, which includes using toliet, bedpan, or urinal?: None Help from another person bathing (including washing, rinsing, drying)?: None Help from another person to put on and taking off regular upper body clothing?: None Help from another person to put on and taking off regular lower body clothing?: None 6 Click Score: 24   End of Session    Activity Tolerance: Patient tolerated treatment well Patient left: in bed;with call bell/phone within reach  OT Visit Diagnosis: Muscle weakness (generalized) (M62.81)                Time: [id number] OT Time Calculation (min): 14 min Charges:  OT General Charges $OT Visit: 1 Visit OT Evaluation $OT Eval Moderate Complexity: 1 Mod  11/24/2020  Rich, OTR/L  Acute Rehabilitation Services  Office:  [phone]   Metta Clines 11/24/2020, 10:33 AM

## 2020-11-24 NOTE — TOC Transition Note (Signed)
Transition of Care [facility]) - CM/SW Discharge Note   Patient Details  Name: Vincent Alvarez MRN: [id number] Date of Birth: [birth year]  Transition of Care [facility]) CM/SW Contact:  Pollie Friar, RN Phone Number: 11/24/2020, 1:22 PM   Clinical Narrative:    Pt discharging home with self care. No f/u per PT/OT and no DME needs.  TOC signing off.   Final next level of care: Home/Self Care Barriers to Discharge: No Barriers Identified   Patient Goals and CMS Choice        Discharge Placement                       Discharge Plan and Services                                     Social Determinants of Health (SDOH) Interventions     Readmission Risk Interventions No flowsheet data found.

## 2020-11-24 NOTE — Progress Notes (Signed)
2145 patient CBG 48, gave 6oz of Ojuice and has sustained CBG 128-130 at rechecked and 0000 check. MD notified, will continue to monitor for hypoglycemia.

## 2020-11-24 NOTE — Progress Notes (Addendum)
STROKE TEAM PROGRESS NOTE   ATTENDING NOTE: I reviewed above note and agree with the assessment and plan. Pt was seen and examined.   80 year old male with history of diabetes, hypertension, hyperlipidemia, TIA in 12/2015 admitted for episode of slurred speech, right facial numbness, right hand numbness.  Per patient, he was shopping in the grocery store and had a sudden onset right hand numbness, right face numbness.  The right hand numbness lasted only several minutes but right face numbness continued.  He went by home, found to have slurred speech, wife sent him to ER.  His symptoms gradually improved, although episode lasted about 1 hour.  CT initially concerning for left internal capsule hypodensity, and a CTA head and neck no LVO but concerning for right temporal occipital infarct.  MRI showed no acute infarct but right temporal occipital chronic small cortical infarct.  EF 65 to 70%.  A1c 7.5, LDL 47, creatinine 1.42.  In 12/2015 patient had similar episode of right facial, arm numbness and weakness.  Earlier of the symptoms, patient had difficulty reading news on the computer.  MRI at that time negative, MRA, carotid Doppler unremarkable.  EF 55 to 60%.  LDL 40, A1c 7.3.  He was discharged with aspirin 325 and Lipitor 20.  On exam, patient is hard of hearing, otherwise neuro intact.  Sensation symmetrical and no motor deficit.  Clear speech, no aphasia.  Etiology for patient's symptoms concerning for TIA, however DDx also including migraine equivalent although patient denies history of migraine.  Patient brother did have bad migraine though.  Recommend aspirin 81 and Plavix 75 DAPT for 3 weeks and then Plavix alone.  Continue Lipitor 20.  Recommend 30-day CardioNet monitor as outpatient to rule out A. fib.  Patient will follow up with Dr. Leonie Alvarez at [facility] in 4 weeks.  For detailed assessment and plan, please refer to above as I have made changes wherever appropriate.   Rosalin Hawking, MD PhD Stroke  Neurology 11/24/2020 2:28 PM    Interval History   No acute events overnight, patient is resting in bed upon entering the room. Inquired about his presenting symptoms he states that this past Saturday he developed right arm and right face numbness out of nowhere. The right arm numbness lasted 2-3 minutes. The face numbness lasted a little bit longer but resolved. In addition he noted difficulty speaking- his wife was unable to understand what he was saying.   At this time he has no remaining sx. Sx resolved the day they began. He denies headache at the time of this symptoms.   Echo is pending for the final part of the stroke work up.   His neurological examination is non focal.   Pertinent Lab Work and Imaging    11/22/20 CT Head WO IV Contrast 1. New small left thalamocapsular hypodensities. Although favored remote, acute infarct is not excluded by CT. An MRI could better evaluate if clinically indicated. 2. No acute hemorrhage. 3. Advanced chronic microvascular ischemic disease.  11/23/20 CT Angio Head and Neck W WO IV Contrast  1. Negative CTA for large vessel occlusion. 2. Mild for age atheromatous change about the carotid bifurcations and carotid siphons without hemodynamically significant or correctable stenosis.  11/24/20 MRI Brain WO IV Contrast 1. No acute intracranial abnormality. 2. Small remote right temporoccipital cortical infarct, corresponding with hypodensity described on prior CT. 3. Age-related cerebral atrophy with moderate chronic microvascular ischemic disease, with multiple remote lacunar infarcts about the bilateral basal ganglia and left thalamus.  11/24/20 Echocardiogram Complete  Ordered, needs to be done   Physical Examination   Constitutional: Calm, appropriate for condition  Cardiovascular: Normal RR Respiratory: No increased WOB   Mental status: AAOx4 Speech: Fluent, repetition and naming intact  Cranial nerves: EOMI, VFF Face symmetric, Tongue  midline, Shoulder shrug intact  Motor: Normal bulk and tone. No drift. Strength 5/5 throughout  Sensory: Intact to light touch throughout  Coordination: FNF intact  Gait: Deferred  NIHSS: 0  Assessment and Plan   Vincent Alvarez is a 80 y.o. male w/pmh of hypertension, type 2 diabetes, hyperlipidemia who presents with transient symptoms of dysarthria, dysphagia and right hand numbness.   #Transient dysarthria, dysphagia and right hand numbness  Patient presented with the symptoms described above. At this time, stroke work up is essentially complete. His MRI Brain was pertinent for no new stroke but a remote right temporoccipital cortical infarct. CTA Head and Neck did not show significant stenosis. Echo is pending. Stroke labs with LDL 47 and hemoglobin a1c 7.5. His presenting symptoms are compelling for a transient ischemic attack. Can also consider complex migraine however he had no headache at the time of sx onset and historically does not have headaches. DAPT was initiated that is to be continued for 21 days followed by Plavix monotherapy. LDL is 47, recommend to continue home Atorvastatin 20 mg QD. Given prior occipital stroke, recommend 30 day cardiac event monitor to evaluate for atrial fibrillation.  - DAPT for 21 days followed by Plavix monotherapy  - Continue Atorvastatin 20 for stroke prevention  - Please set up 30 day cardiac event monitor  - Will place a referral for TIA follow up   #Hypertension He has a history of HTN and takes Amlodipine 5 mg QD and Losartan 100 QD at home. Currently blood pressure is trending in the 130-140 range. No need for permissive hypertension at this time.   #Hyperlipidemia From a stroke prevention stand point, the LDL goal is < 70. LDL is at goal at 47 - Continue Atorvastatin 20 mg QD   #DMII  Hemoglobin A1C this admission noted to be 7.5. Goal is < 7 from a stroke reduction standpoint. Recommend using SSI to manage hyperglycemia while  hospitalized. - Follow up with PCP at discharge to manage [facility] day # 0  Ruta Hinds, NP  [facility] Nurse Practitioner Patient seen and discussed with attending physician Dr. Erlinda Hong    To contact Stroke Continuity provider, please refer to [email or web address] After hours, contact General Neurology

## 2020-11-24 NOTE — Progress Notes (Signed)
Inpatient Diabetes Program Recommendations  AACE/ADA: New Consensus Statement on Inpatient Glycemic Control (2015)  Target Ranges:  Prepandial:   less than 140 mg/dL      Peak postprandial:   less than 180 mg/dL (1-2 hours)      Critically ill patients:  140 - 180 mg/dL   Lab Results  Component Value Date   GLUCAP 196 (H) 11/24/2020   HGBA1C 7.5 (H) 11/24/2020    Review of Glycemic Control Results for [place], FOGLER (MRN [id number]) as of 11/24/2020 10:44  Ref. Range 11/23/2020 19:09 11/23/2020 21:28 11/23/2020 21:50 11/24/2020 00:28 11/24/2020 06:20  Glucose-Capillary Latest Ref Range: 70 - 99 mg/dL 234 (H) 48 (L) 128 (H) 130 (H) 196 (H)   Diabetes history: DM 2 Outpatient Diabetes medications: Metformin 1000 mg bid, Januvia 100 mg Daily Current orders for Inpatient glycemic control:  Novolog 0-15 units tid + hs  A1c 7.5% on 8/10  Inpatient Diabetes Program Recommendations:    Hypoglycemia 48 at 2128 pm yesterday due to Novolog Correction dose (5 units for glucose of 234)  - reduce Novolog Correction to 0-9 units tid  Thanks,  Tama Headings RN, MSN, BC-ADM Inpatient Diabetes Coordinator Team Pager [phone] (8a-5p)

## 2020-11-24 NOTE — Progress Notes (Signed)
SLP Cancellation Note  Patient Details Name: ALIN MAISTO MRN: [id number] DOB: Oct 25, 1940   Cancelled treatment:       Reason Eval/Treat Not Completed: Patient at procedure or test/unavailable. Will f/u as able.    Osie Bond., M.A. [place] Acute Rehabilitation Services Pager [phone] Office [phone]  11/24/2020, 11:29 AM

## 2020-11-24 NOTE — Progress Notes (Signed)
Echocardiogram 2D Echocardiogram has been performed.  Oneal Deputy Jessy Cybulski RDCS 11/24/2020, 12:08 PM

## 2020-11-24 NOTE — Evaluation (Signed)
Speech Language Pathology Evaluation Patient Details Name: Vincent Alvarez MRN: [id number] DOB: 1940-09-04 Today's Date: 11/24/2020 Time: [id number] SLP Time Calculation (min) (ACUTE ONLY): 22 min  Problem List:  Patient Active Problem List   Diagnosis Date Noted   CVA (cerebral vascular accident) ([place]) 11/23/2020   Dysarthria 11/23/2020   Normocytic anemia 11/23/2020   Right sided numbness 11/22/2020   TIA (transient ischemic attack) 12/18/2015   Diabetes mellitus, type 2 ([place]) 12/18/2015   Essential hypertension 12/18/2015   Hyperlipidemia 12/18/2015   Depression 12/18/2015   Past Medical History:  Past Medical History:  Diagnosis Date   Atypical chest pain    Depression    Diabetes mellitus without complication ([place])    Diabetic retinopathy ([place])    Gout    Hearing loss    Hyperlipidemia    Hypertension    Onychomycosis    UTI (lower urinary tract infection)    Past Surgical History:  Past Surgical History:  Procedure Laterality Date   CATARACT EXTRACTION, BILATERAL     CHOLECYSTECTOMY     COLONOSCOPY     TONSILLECTOMY     HPI:  Pt is an 80 yo who presented on 11/22/20 with transient symptoms of dysarthria, dysphasia, and R hand numbness.  CT Head revealed new small left thalamocapsular hypodensities; although favored remote, acute infarct not excluded by CT. MRI without acute changes though. Pt with hx of CVA in 2017, DM, diabetic retinopathy, gout, HLD, and HTN.   Assessment / Plan / Recommendation Clinical Impression  Pt appears to be at his cognitive-linguistic baseline, scoring 28/30 on the SLUMS and denying any persistent, acute changes. His speech also sounds fluent and clear to this SLP, although he and his wife believe that it is not completely back to his baseline yet. They do acknowledge that it has improved a lot, and in light of these improvements, pt would like to hold on any f/u SLP services at this time. He knows that OP SLP could be available to him if  his symptoms do not continue to resolve. SLP to sign off acutely.    SLP Assessment  SLP Recommendation/Assessment: Patient does not need any further Speech Lanaguage Pathology Services SLP Visit Diagnosis: Dysarthria and anarthria (R47.1)    Follow Up Recommendations  Outpatient SLP (only if his symptoms do not fully resolve)    Frequency and Duration           SLP Evaluation Cognition  Overall Cognitive Status: Within Functional Limits for tasks assessed Orientation Level: Oriented X4       Comprehension  Auditory Comprehension Overall Auditory Comprehension: Appears within functional limits for tasks assessed    Expression Expression Primary Mode of Expression: Verbal Verbal Expression Overall Verbal Expression: Appears within functional limits for tasks assessed Written Expression Dominant Hand: Right   Oral / Motor  Motor Speech Overall Motor Speech: Appears within functional limits for tasks assessed   GO                    Osie Bond., M.A. [place] Acute Rehabilitation Services Pager [phone] Office [phone]  11/24/2020, 2:06 PM

## 2020-11-24 NOTE — Discharge Summary (Addendum)
Physician Discharge Summary  Vincent Alvarez [id number] DOB: 1941/04/14 DOA: 11/22/2020  PCP: Lawerance Cruel, MD  Admit date: 11/22/2020 Discharge date: 11/24/2020  Admitted From: Home Disposition:  Home  Discharge Condition:Stable CODE STATUS: DNR Diet recommendation: Heart Healthy   Brief/Interim Summary:   HPI: Vincent Alvarez is a 80 y.o. right-handed male with medical history significant of hypertension, hyperlipidemia, diabetes mellitus type 2, and TIA who presents with right hand and face numbness.  Symptoms started yesterday afternoon around 2:30 PM while he was shopping in the grocery store.  Patient reports that he may have noticed some mild weakness on his right side.  He states feeling confused at that time and was not able to remember how to use his cell phone. Patient noted that he was drooling, speech was slurred, and had trouble finding his words. 3 days ago while he was at home reading book he temporarily reported that the word on the pages got all jumbled up and was unable to read.  Patient denies having any recent fever, cough, chest pain, shortness of breath, nausea, vomiting, or falls.  Symptoms seem to be improving after 1 hour, but at this time patient still reports some difficulty with his words and his speech is still not back to his normal.  Patient reported having TIA back in 2017 which affected his right hand and he temporarily had difficulty reading, but MRI did not show any acute abnormalities.   ED Course: Patient was seen to be afebrile, pulse 58-78, blood pressures 133/66 ~158/81, and all other vital signs maintained.  CT scan of the brain showed a new small left thalamocapsular hypodensities thought to be remote, but an acute infarct was not excluded.  He had been evaluated by telemetry neurology and was a candidate for tPA, but refused administration after being explained risk and benefits.  Labs from 8/8 significant for hemoglobin 12.9 and sodium 134.   Influenza and COVID-19 screening were negative.  Patient had been given 650 mg of aspirin and started on Plavix.   Hospital course:  His hospital course remained stable.  His right-sided numbness on the face and right hand completely resolved.MRI of the brain did not show any acute intracranial abnormalities but showed remote small right temporoccipital infarct, age-related atrophy, moderate chronic microvascular changes. Neurology was also consulted and following during this hospitalization. Stroke work-up initiated and completed.  PT/OT did not recommend any follow-up. Patient seen and examined at the bedside this morning.  He is completely alert and oriented and denies any complaints.  He does not have any focal neurological deficits. Echo showed EF of 65 to [id number], grade 1 diastolic dysfunction, no intracardiac source of emboli. Patient will be discharged on aspirin and Plavix for total of 21 days duration followed by Plavix monotherapy.  He will continue Lipitor 20 mg daily at home.  We have reached out to cardiology for arranging 30-day event monitoring.  He will follow-up with [facility] neurology as an outpatient.  He will continue other home  medications for his other medical problems.  We recommend to follow-up with his PCP in a week. I tried to call his wife for the discharge update, phone not received.  Discharge Diagnoses:  Principal Problem:   Dysarthria Active Problems:   Diabetes mellitus, type 2 (HCC)   Essential hypertension   Hyperlipidemia   Right sided numbness   CVA (cerebral vascular accident) ([place])   Normocytic anemia    Discharge Instructions  Discharge Instructions  Ambulatory referral to Neurology   Complete by: As directed    An appointment is requested in approximately: 6 to 8 weeks with Dr. Leonie Man   Diet - low sodium heart healthy   Complete by: As directed    Discharge instructions   Complete by: As directed    1)Please take prescribed medications as  instructed 2)Follow up with your PCP in a week 3)Follow up with [facility] neurology in 4 weeks.  Name and number the provider has been attached. You might be called for appointment.   Increase activity slowly   Complete by: As directed       Allergies as of 11/24/2020       Reactions   Shrimp [shellfish Allergy] Nausea And Vomiting        Medication List     STOP taking these medications    aspirin 325 MG tablet Replaced by: aspirin 81 MG EC tablet   ramipril 10 MG capsule Commonly known as: ALTACE       TAKE these medications    amLODipine 5 MG tablet Commonly known as: NORVASC Take 5 mg by mouth every morning.   aspirin 81 MG EC tablet Take 1 tablet (81 mg total) by mouth daily for 19 days. Swallow whole. Start taking on: November 25, 2020 Replaces: aspirin 325 MG tablet   atorvastatin 20 MG tablet Commonly known as: LIPITOR Take 20 mg by mouth every morning.   B COMPLEX PO Take 1 tablet by mouth daily.   clopidogrel 75 MG tablet Commonly known as: PLAVIX Take 1 tablet (75 mg total) by mouth daily. Continue taking this medication Start taking on: November 25, 2020   FLUoxetine 20 MG capsule Commonly known as: PROZAC Take 20 mg by mouth every morning.   fluticasone 50 MCG/ACT nasal spray Commonly known as: FLONASE Place 1 spray into both nostrils daily as needed for allergies.   losartan 100 MG tablet Commonly known as: COZAAR Take 100 mg by mouth daily.   metFORMIN 500 MG tablet Commonly known as: GLUCOPHAGE Take 1,000 mg by mouth 2 (two) times daily with a meal.   Pfizer-BioNT COVID-19 Vac-TriS Susp injection Generic drug: COVID-19 mRNA Vac-TriS (Pfizer) Inject into the muscle.   sitaGLIPtin 100 MG tablet Commonly known as: JANUVIA Take 100 mg by mouth daily.   tamsulosin 0.4 MG Caps capsule Commonly known as: FLOMAX Take 0.4 mg by mouth daily after supper.   traZODone 50 MG tablet Commonly known as: DESYREL Take 50 mg by mouth at  bedtime.   vitamin B-12 250 MCG tablet Commonly known as: CYANOCOBALAMIN Take 250 mcg by mouth daily.        Follow-up Information     Lawerance Cruel, MD. Schedule an appointment as soon as possible for a visit in 1 week(s).   Specialty: Family Medicine Contact information: [place] [address] [phone]                Allergies  Allergen Reactions   Shrimp [Shellfish Allergy] Nausea And Vomiting    Consultations: Neurology   Procedures/Studies: CT ANGIO HEAD W OR WO CONTRAST  Result Date: 11/23/2020 CLINICAL DATA:  Follow-up examination for acute stroke. EXAM: CT ANGIOGRAPHY HEAD AND NECK TECHNIQUE: Multidetector CT imaging of the head and neck was performed using the standard protocol during bolus administration of intravenous contrast. Multiplanar CT image reconstructions and MIPs were obtained to evaluate the vascular anatomy. Carotid stenosis measurements (when applicable) are obtained utilizing NASCET criteria, using the  distal internal carotid diameter as the denominator. CONTRAST:  82m OMNIPAQUE IOHEXOL 350 MG/ML SOLN COMPARISON:  CT from 11/22/2020. FINDINGS: CT HEAD FINDINGS Brain: Age-related cerebral atrophy with moderate to advanced chronic microvascular ischemic disease. Scattered remote lacunar infarcts noted about the left basal ganglia left thalamus, stable. There is slightly increased prominence of a 1.2 cm hypodensity involving the cortical aspect of the right temporal occipital region (series 13, image 14). While this may be related to volume averaging, a possible small volume acute ischemic infarct could also be considered. No other visible large vessel territory infarct. No acute intracranial hemorrhage. No mass lesion or mass effect. No midline shift or hydrocephalus. No extra-axial fluid collection. Vascular: No hyperdense vessel. Skull: Scalp soft tissues and calvarium within normal limits. Sinuses: Paranasal sinuses and  mastoid air cells are clear. Orbits: Prior bilateral ocular lens replacement. Globes and orbital soft tissues demonstrate no acute finding. Review of the MIP images confirms the above findings CTA NECK FINDINGS Aortic arch: Visualized aortic arch normal caliber with normal branch pattern. Mild atheromatous change about the aortic arch and origin of the great vessels without stenosis. Right carotid system: Right common and internal carotid arteries patent without stenosis, dissection or occlusion. Mild for age atheromatous change about the right carotid bulb/proximal right ICA without stenosis. Left carotid system: Left common and internal carotid arteries patent without significant stenosis, dissection or occlusion. Scattered calcified plaque about the left carotid bulb/proximal left ICA without significant stenosis. Vertebral arteries: Both vertebral arteries arise from the subclavian arteries. No proximal subclavian artery stenosis. Right vertebral artery dominant. Atheromatous plaque at the origins of both vertebral arteries without high-grade stenosis. Vertebral arteries patent within the neck without stenosis, dissection or occlusion. Skeleton: No acute osseous finding. No discrete or worrisome osseous lesions. Other neck: No other acute soft tissue abnormality within the neck. No mass or adenopathy. Upper chest: Visualized upper chest demonstrates no acute finding. Review of the MIP images confirms the above findings CTA HEAD FINDINGS Anterior circulation: Petrous segments patent bilaterally. Scattered atheromatous plaque within the carotid siphons without significant stenosis. A1 segments patent bilaterally. Right A1 hypoplastic, accounting for the diminutive right ICA is compared to the left. Normal anterior communicating artery complex. Anterior cerebral arteries patent to their distal aspects without stenosis. No M1 stenosis or occlusion. Normal MCA bifurcations. Distal MCA branches well perfused and  symmetric. Posterior circulation: Both V4 segments patent to the vertebrobasilar junction without stenosis. Right PICA origin patent. Left PICA not seen. Basilar widely patent to its distal aspect without stenosis. Superior cerebellar arteries patent bilaterally. Both PCAs primarily supplied via the basilar well perfused to their distal aspects. Venous sinuses: Grossly patent allowing for timing the contrast bolus. Anatomic variants: Hypoplastic right A1 segment. No intracranial aneurysm. Review of the MIP images confirms the above findings IMPRESSION: CT HEAD IMPRESSION: 1. Interval increase in prominence of a 1.2 cm hypodensity involving the cortical aspect of the right temporoccipital region. While this finding may be related to volume averaging, a possible small evolving acute ischemic infarct could also be considered in the correct clinical setting. 2. Otherwise stable head CT with no other new acute intracranial abnormality. 3. Atrophy with moderately advanced chronic microvascular ischemic disease. CTA HEAD AND NECK IMPRESSION: 1. Negative CTA for large vessel occlusion. 2. Mild for age atheromatous change about the carotid bifurcations and carotid siphons without hemodynamically significant or correctable stenosis. Electronically Signed   By: BJeannine BogaM.D.   On: 11/23/2020 21:40   DG Chest 2  View  Result Date: 11/23/2020 CLINICAL DATA:  Right-sided hand and face numbness for 1 day. EXAM: CHEST - 2 VIEW COMPARISON:  08/08/2012 FINDINGS: Lungs are hyperexpanded. Right lung clear. Small nodular density noted left base. The cardiopericardial silhouette is within normal limits for size. The visualized bony structures of the thorax show no acute abnormality. IMPRESSION: Small nodular density at the left base. CT chest without contrast recommended to further evaluate. These results will be called to the ordering clinician or representative by the Radiologist Assistant, and communication documented in  the PACS or [employer]. Electronically Signed   By: Misty Stanley M.D.   On: 11/23/2020 17:03   CT ANGIO NECK W OR WO CONTRAST  Result Date: 11/23/2020 CLINICAL DATA:  Follow-up examination for acute stroke. EXAM: CT ANGIOGRAPHY HEAD AND NECK TECHNIQUE: Multidetector CT imaging of the head and neck was performed using the standard protocol during bolus administration of intravenous contrast. Multiplanar CT image reconstructions and MIPs were obtained to evaluate the vascular anatomy. Carotid stenosis measurements (when applicable) are obtained utilizing NASCET criteria, using the distal internal carotid diameter as the denominator. CONTRAST:  65m OMNIPAQUE IOHEXOL 350 MG/ML SOLN COMPARISON:  CT from 11/22/2020. FINDINGS: CT HEAD FINDINGS Brain: Age-related cerebral atrophy with moderate to advanced chronic microvascular ischemic disease. Scattered remote lacunar infarcts noted about the left basal ganglia left thalamus, stable. There is slightly increased prominence of a 1.2 cm hypodensity involving the cortical aspect of the right temporal occipital region (series 13, image 14). While this may be related to volume averaging, a possible small volume acute ischemic infarct could also be considered. No other visible large vessel territory infarct. No acute intracranial hemorrhage. No mass lesion or mass effect. No midline shift or hydrocephalus. No extra-axial fluid collection. Vascular: No hyperdense vessel. Skull: Scalp soft tissues and calvarium within normal limits. Sinuses: Paranasal sinuses and mastoid air cells are clear. Orbits: Prior bilateral ocular lens replacement. Globes and orbital soft tissues demonstrate no acute finding. Review of the MIP images confirms the above findings CTA NECK FINDINGS Aortic arch: Visualized aortic arch normal caliber with normal branch pattern. Mild atheromatous change about the aortic arch and origin of the great vessels without stenosis. Right carotid system: Right  common and internal carotid arteries patent without stenosis, dissection or occlusion. Mild for age atheromatous change about the right carotid bulb/proximal right ICA without stenosis. Left carotid system: Left common and internal carotid arteries patent without significant stenosis, dissection or occlusion. Scattered calcified plaque about the left carotid bulb/proximal left ICA without significant stenosis. Vertebral arteries: Both vertebral arteries arise from the subclavian arteries. No proximal subclavian artery stenosis. Right vertebral artery dominant. Atheromatous plaque at the origins of both vertebral arteries without high-grade stenosis. Vertebral arteries patent within the neck without stenosis, dissection or occlusion. Skeleton: No acute osseous finding. No discrete or worrisome osseous lesions. Other neck: No other acute soft tissue abnormality within the neck. No mass or adenopathy. Upper chest: Visualized upper chest demonstrates no acute finding. Review of the MIP images confirms the above findings CTA HEAD FINDINGS Anterior circulation: Petrous segments patent bilaterally. Scattered atheromatous plaque within the carotid siphons without significant stenosis. A1 segments patent bilaterally. Right A1 hypoplastic, accounting for the diminutive right ICA is compared to the left. Normal anterior communicating artery complex. Anterior cerebral arteries patent to their distal aspects without stenosis. No M1 stenosis or occlusion. Normal MCA bifurcations. Distal MCA branches well perfused and symmetric. Posterior circulation: Both V4 segments patent to the vertebrobasilar junction  without stenosis. Right PICA origin patent. Left PICA not seen. Basilar widely patent to its distal aspect without stenosis. Superior cerebellar arteries patent bilaterally. Both PCAs primarily supplied via the basilar well perfused to their distal aspects. Venous sinuses: Grossly patent allowing for timing the contrast bolus.  Anatomic variants: Hypoplastic right A1 segment. No intracranial aneurysm. Review of the MIP images confirms the above findings IMPRESSION: CT HEAD IMPRESSION: 1. Interval increase in prominence of a 1.2 cm hypodensity involving the cortical aspect of the right temporoccipital region. While this finding may be related to volume averaging, a possible small evolving acute ischemic infarct could also be considered in the correct clinical setting. 2. Otherwise stable head CT with no other new acute intracranial abnormality. 3. Atrophy with moderately advanced chronic microvascular ischemic disease. CTA HEAD AND NECK IMPRESSION: 1. Negative CTA for large vessel occlusion. 2. Mild for age atheromatous change about the carotid bifurcations and carotid siphons without hemodynamically significant or correctable stenosis. Electronically Signed   By: Jeannine Boga M.D.   On: 11/23/2020 21:40   MR BRAIN WO CONTRAST  Result Date: 11/24/2020 CLINICAL DATA:  Follow-up examination for acute stroke. EXAM: MRI HEAD WITHOUT CONTRAST TECHNIQUE: Multiplanar, multiecho pulse sequences of the brain and surrounding structures were obtained without intravenous contrast. COMPARISON:  CTA from 11/23/2020 and CT from 11/22/2020. FINDINGS: Brain: Diffuse prominence of the CSF containing spaces compatible generalized age-related cerebral atrophy. Patchy and confluent T2/FLAIR hyperintensity involving the periventricular and deep white matter both cerebral hemispheres most consistent with chronic small vessel ischemic disease, moderately advanced in nature. Multiple scattered remote lacunar infarcts present about the bilateral basal ganglia and left thalamus. Small remote cortical infarct present at the right temporoccipital region, corresponding with hypodensity seen on prior CT (series 11, image 10). No abnormal foci of restricted diffusion to suggest acute or subacute ischemia. Gray-white matter differentiation otherwise maintained.  No other areas of remote cortical infarction. No acute intracranial hemorrhage. Few small chronic micro hemorrhages noted clustered about the deep gray nuclei, likely hypertensive in nature. No mass lesion, midline shift or mass effect. No hydrocephalus or extra-axial fluid collection. Pituitary gland and suprasellar region within normal limits. Midline structures intact. Vascular: Major intracranial vascular flow voids are maintained. Skull and upper cervical spine: Craniocervical junction within normal limits. Bone marrow signal intensity within normal limits. No scalp soft tissue abnormality. Sinuses/Orbits: Prior bilateral ocular lens replacement. Globes and orbital soft tissues demonstrate no acute finding. Scattered mucoperiosteal thickening noted within the ethmoidal air cells and maxillary sinuses. Paranasal sinuses are otherwise clear. No significant mastoid effusion. Inner ear structures grossly normal. Other: None. IMPRESSION: 1. No acute intracranial abnormality. 2. Small remote right temporoccipital cortical infarct, corresponding with hypodensity described on prior CT. 3. Age-related cerebral atrophy with moderate chronic microvascular ischemic disease, with multiple remote lacunar infarcts about the bilateral basal ganglia and left thalamus. Electronically Signed   By: Jeannine Boga M.D.   On: 11/24/2020 01:11   ECHOCARDIOGRAM COMPLETE  Result Date: 11/24/2020    ECHOCARDIOGRAM REPORT   Patient Name:   Vincent Alvarez [facility] Date of Exam: 11/24/2020 Medical Rec #:  [id number]          Height:       68.0 in Accession #:    [id number]         Weight:       227.1 lb Date of Birth:  [birth year]           BSA:  2.157 m Patient Age:    80 years           BP:           141/69 mmHg Patient Gender: M                  HR:           67 bpm. Exam Location:  Inpatient Procedure: 2D Echo, Color Doppler and Cardiac Doppler Indications:    TIA  History:        Patient has prior history of Echocardiogram  examinations. Risk                 Factors:Hypertension, Diabetes and Dyslipidemia.  Sonographer:    Raquel Sarna Senior RDCS Referring Phys: [phone] [place]  1. Left ventricular ejection fraction, by estimation, is 65 to 70%. The left ventricle has normal function. The left ventricle has no regional wall motion abnormalities. There is mild left ventricular hypertrophy. Left ventricular diastolic parameters are consistent with Grade I diastolic dysfunction (impaired relaxation). Elevated left ventricular end-diastolic pressure. The E/e' is 61.  2. Right ventricular systolic function is normal. The right ventricular size is normal. Tricuspid regurgitation signal is inadequate for assessing PA pressure.  3. The mitral valve is abnormal. Trivial mitral valve regurgitation.  4. The aortic valve is tricuspid. Aortic valve regurgitation is not visualized. Mild to moderate aortic valve sclerosis/calcification is present, without any evidence of aortic stenosis.  5. The inferior vena cava is normal in size with greater than 50% respiratory variability, suggesting right atrial pressure of 3 mmHg. Comparison(s): No prior Echocardiogram. FINDINGS  Left Ventricle: Left ventricular ejection fraction, by estimation, is 65 to 70%. The left ventricle has normal function. The left ventricle has no regional wall motion abnormalities. The left ventricular internal cavity size was normal in size. There is  mild left ventricular hypertrophy. Left ventricular diastolic parameters are consistent with Grade I diastolic dysfunction (impaired relaxation). Elevated left ventricular end-diastolic pressure. The E/e' is 14. Right Ventricle: The right ventricular size is normal. No increase in right ventricular wall thickness. Right ventricular systolic function is normal. Tricuspid regurgitation signal is inadequate for assessing PA pressure. Left Atrium: Left atrial size was normal in size. Right Atrium: Right atrial size was  normal in size. Pericardium: There is no evidence of pericardial effusion. Mitral Valve: The mitral valve is abnormal. There is mild thickening of the mitral valve leaflet(s). Mild to moderate mitral annular calcification. Trivial mitral valve regurgitation. Tricuspid Valve: The tricuspid valve is grossly normal. Tricuspid valve regurgitation is trivial. Aortic Valve: The aortic valve is tricuspid. Aortic valve regurgitation is not visualized. Mild to moderate aortic valve sclerosis/calcification is present, without any evidence of aortic stenosis. Pulmonic Valve: The pulmonic valve was grossly normal. Pulmonic valve regurgitation is not visualized. Aorta: The aortic root and ascending aorta are structurally normal, with no evidence of dilitation. Venous: The inferior vena cava is normal in size with greater than 50% respiratory variability, suggesting right atrial pressure of 3 mmHg. IAS/Shunts: The interatrial septum was not well visualized.  LEFT VENTRICLE PLAX 2D LVIDd:         5.00 cm  Diastology LVIDs:         2.90 cm  LV e' medial:    4.46 cm/s LV PW:         1.30 cm  LV E/e' medial:  25.6 LV IVS:        1.00 cm  LV e' lateral:  5.87 cm/s LVOT diam:     2.30 cm  LV E/e' lateral: 19.4 LV SV:         86 LV SV Index:   40 LVOT Area:     4.15 cm  RIGHT VENTRICLE RV S prime:     13.60 cm/s TAPSE (M-mode): 2.4 cm LEFT ATRIUM             Index       RIGHT ATRIUM           Index LA diam:        4.20 cm 1.95 cm/m  RA Area:     19.00 cm LA Vol (A2C):   59.8 ml 27.72 ml/m RA Volume:   53.40 ml  24.76 ml/m LA Vol (A4C):   65.9 ml 30.55 ml/m LA Biplane Vol: 66.5 ml 30.83 ml/m  AORTIC VALVE LVOT Vmax:   91.40 cm/s LVOT Vmean:  65.100 cm/s LVOT VTI:    0.208 m  AORTA Ao Root diam: 3.60 cm Ao Asc diam:  3.40 cm MITRAL VALVE MV Area (PHT): 2.75 cm     SHUNTS MV Decel Time: 276 msec     Systemic VTI:  0.21 m MV E velocity: 114.00 cm/s  Systemic Diam: 2.30 cm MV A velocity: 149.00 cm/s MV E/A ratio:  0.77 Vincent Bishop  MD Electronically signed by Vincent Bishop MD Signature Date/Time: 11/24/2020/1:11:51 PM    Final    CT HEAD CODE STROKE WO CONTRAST  Result Date: 11/22/2020 CLINICAL DATA:  Code stroke.  Neuro deficit, acute, stroke suspected EXAM: CT HEAD WITHOUT CONTRAST TECHNIQUE: Contiguous axial images were obtained from the base of the skull through the vertex without intravenous contrast. COMPARISON:  CT head 12/18/2015. FINDINGS: Brain: No evidence of acute large vascular territory infarction, hemorrhage, hydrocephalus, extra-axial collection or mass lesion/mass effect.New left thalamocapsular hypodensities. Additional patchy advanced white matter hypoattenuation, likely related to chronic microvascular ischemic disease. Moderate atrophy. Vascular: No hyperdense vessel identified. Calcific intracranial atherosclerosis. Skull: No acute fracture. Sinuses/Orbits: Visualized sinuses are clear. Other: No mastoid effusions. IMPRESSION: 1. New small left thalamocapsular hypodensities. Although favored remote, acute infarct is not excluded by CT. An MRI could better evaluate if clinically indicated. 2. No acute hemorrhage. 3. Advanced chronic microvascular ischemic disease. Findings discussed with Dr. Melina Copa via telephone at 3:08 p.m. Electronically Signed   By: Margaretha Sheffield MD   On: 11/22/2020 15:14      Subjective:  Patient seen and examined at the bedside this morning.  Hemodynamically stable for discharge today.  Discharge Exam: Vitals:   11/24/20 0755 11/24/20 1141  BP: (!) 141/69 140/81  Pulse: 81 65  Resp: 20 17  Temp: 98 F (36.7 C) 98.4 F (36.9 C)  SpO2: 97% 96%   Vitals:   11/24/20 0038 11/24/20 0351 11/24/20 0755 11/24/20 1141  BP: (!) 145/73 (!) 146/77 (!) 141/69 140/81  Pulse: 69 73 81 65  Resp: '18 17 20 17  '$ Temp: 97.7 F (36.5 C) 98.2 F (36.8 C) 98 F (36.7 C) 98.4 F (36.9 C)  TempSrc: Oral Oral Oral Oral  SpO2: 98% 96% 97% 96%  Weight:      Height:        General: Pt is  alert, awake, not in acute distress Cardiovascular: RRR, S1/S2 +, no rubs, no gallops Respiratory: CTA bilaterally, no wheezing, no rhonchi Abdominal: Soft, NT, ND, bowel sounds + Extremities: no edema, no cyanosis    The results of significant diagnostics from this hospitalization (including imaging,  microbiology, ancillary and laboratory) are listed below for reference.     Microbiology: Recent Results (from the past 240 hour(s))  Resp Panel by RT-PCR (Flu A&B, Covid) Nasopharyngeal Swab     Status: None   Collection Time: 11/22/20  3:41 PM   Specimen: Nasopharyngeal Swab; Nasopharyngeal(NP) swabs in vial transport medium  Result Value Ref Range Status   SARS Coronavirus 2 by RT PCR NEGATIVE NEGATIVE Final    Comment: (NOTE) SARS-CoV-2 target nucleic acids are NOT DETECTED.  The SARS-CoV-2 RNA is generally detectable in upper respiratory specimens during the acute phase of infection. The lowest concentration of SARS-CoV-2 viral copies this assay can detect is 138 copies/mL. A negative result does not preclude SARS-Cov-2 infection and should not be used as the sole basis for treatment or other patient management decisions. A negative result may occur with  improper specimen collection/handling, submission of specimen other than nasopharyngeal swab, presence of viral mutation(s) within the areas targeted by this assay, and inadequate number of viral copies(<138 copies/mL). A negative result must be combined with clinical observations, patient history, and epidemiological information. The expected result is Negative.  Fact Sheet for Patients:  EntrepreneurPulse.com.au  Fact Sheet for Healthcare Providers:  IncredibleEmployment.be  This test is no t yet approved or cleared by the [place] FDA and  has been authorized for detection and/or diagnosis of SARS-CoV-2 by FDA under an Emergency Use Authorization (EUA). This EUA will remain  in  effect (meaning this test can be used) for the duration of the COVID-19 declaration under Section 564(b)(1) of the Act, 21 U.S.C.section 360bbb-3(b)(1), unless the authorization is terminated  or revoked sooner.       Influenza A by PCR NEGATIVE NEGATIVE Final   Influenza B by PCR NEGATIVE NEGATIVE Final    Comment: (NOTE) The Xpert Xpress SARS-CoV-2/FLU/RSV plus assay is intended as an aid in the diagnosis of influenza from Nasopharyngeal swab specimens and should not be used as a sole basis for treatment. Nasal washings and aspirates are unacceptable for Xpert Xpress SARS-CoV-2/FLU/RSV testing.  Fact Sheet for Patients: EntrepreneurPulse.com.au  Fact Sheet for Healthcare Providers: IncredibleEmployment.be  This test is not yet approved or cleared by the [place] FDA and has been authorized for detection and/or diagnosis of SARS-CoV-2 by FDA under an Emergency Use Authorization (EUA). This EUA will remain in effect (meaning this test can be used) for the duration of the COVID-19 declaration under Section 564(b)(1) of the Act, 21 U.S.C. section 360bbb-3(b)(1), unless the authorization is terminated or revoked.  Performed at [facility], [address] [place], [place] [address]      Labs: BNP (last 3 results) No results for input(s): BNP in the last 8760 hours. Basic Metabolic Panel: Recent Labs  Lab 11/22/20 1512 11/23/20 1521 11/24/20 0800  NA 134* 134* 137  K 4.5 3.8 4.0  CL 99 97* 103  CO2 '26 27 28  '$ GLUCOSE 97 254* 165*  BUN '19 19 16  '$ CREATININE 1.29* 1.42* 1.05  CALCIUM 9.2 9.3 9.2   Liver Function Tests: Recent Labs  Lab 11/22/20 1512  AST 15  ALT 14  ALKPHOS 79  BILITOT 0.6  PROT 6.8  ALBUMIN 4.3   No results for input(s): LIPASE, AMYLASE in the last 168 hours. No results for input(s): AMMONIA in the last 168 hours. CBC: Recent Labs  Lab 11/22/20 1512 11/23/20 1521  WBC 8.7 6.5   NEUTROABS 5.9 4.6  HGB 12.9* 12.8*  HCT 39.3 39.5  MCV 90.8 91.6  PLT 235 257   Cardiac Enzymes: No results for input(s): CKTOTAL, CKMB, CKMBINDEX, TROPONINI in the last 168 hours. BNP: Invalid input(s): POCBNP CBG: Recent Labs  Lab 11/23/20 2128 11/23/20 2150 11/24/20 0028 11/24/20 0620 11/24/20 1142  GLUCAP 48* 128* 130* 196* 138*   D-Dimer No results for input(s): DDIMER in the last 72 hours. Hgb A1c Recent Labs    11/24/20 0335  HGBA1C 7.5*   Lipid Profile Recent Labs    11/24/20 0335  CHOL 109  HDL 49  LDLCALC 47  TRIG 67  CHOLHDL 2.2   Thyroid function studies No results for input(s): TSH, T4TOTAL, T3FREE, THYROIDAB in the last 72 hours.  Invalid input(s): FREET3 Anemia work up No results for input(s): VITAMINB12, FOLATE, FERRITIN, TIBC, IRON, RETICCTPCT in the last 72 hours. Urinalysis No results found for: COLORURINE, APPEARANCEUR, [place], [place], GLUCOSEU, [place], [place], [place], PROTEINUR, UROBILINOGEN, NITRITE, LEUKOCYTESUR Sepsis Labs Invalid input(s): PROCALCITONIN,  WBC,  LACTICIDVEN Microbiology Recent Results (from the past 240 hour(s))  Resp Panel by RT-PCR (Flu A&B, Covid) Nasopharyngeal Swab     Status: None   Collection Time: 11/22/20  3:41 PM   Specimen: Nasopharyngeal Swab; Nasopharyngeal(NP) swabs in vial transport medium  Result Value Ref Range Status   SARS Coronavirus 2 by RT PCR NEGATIVE NEGATIVE Final    Comment: (NOTE) SARS-CoV-2 target nucleic acids are NOT DETECTED.  The SARS-CoV-2 RNA is generally detectable in upper respiratory specimens during the acute phase of infection. The lowest concentration of SARS-CoV-2 viral copies this assay can detect is 138 copies/mL. A negative result does not preclude SARS-Cov-2 infection and should not be used as the sole basis for treatment or other patient management decisions. A negative result may occur with  improper specimen collection/handling, submission of specimen  other than nasopharyngeal swab, presence of viral mutation(s) within the areas targeted by this assay, and inadequate number of viral copies(<138 copies/mL). A negative result must be combined with clinical observations, patient history, and epidemiological information. The expected result is Negative.  Fact Sheet for Patients:  EntrepreneurPulse.com.au  Fact Sheet for Healthcare Providers:  IncredibleEmployment.be  This test is no t yet approved or cleared by the [place] FDA and  has been authorized for detection and/or diagnosis of SARS-CoV-2 by FDA under an Emergency Use Authorization (EUA). This EUA will remain  in effect (meaning this test can be used) for the duration of the COVID-19 declaration under Section 564(b)(1) of the Act, 21 U.S.C.section 360bbb-3(b)(1), unless the authorization is terminated  or revoked sooner.       Influenza A by PCR NEGATIVE NEGATIVE Final   Influenza B by PCR NEGATIVE NEGATIVE Final    Comment: (NOTE) The Xpert Xpress SARS-CoV-2/FLU/RSV plus assay is intended as an aid in the diagnosis of influenza from Nasopharyngeal swab specimens and should not be used as a sole basis for treatment. Nasal washings and aspirates are unacceptable for Xpert Xpress SARS-CoV-2/FLU/RSV testing.  Fact Sheet for Patients: EntrepreneurPulse.com.au  Fact Sheet for Healthcare Providers: IncredibleEmployment.be  This test is not yet approved or cleared by the [place] FDA and has been authorized for detection and/or diagnosis of SARS-CoV-2 by FDA under an Emergency Use Authorization (EUA). This EUA will remain in effect (meaning this test can be used) for the duration of the COVID-19 declaration under Section 564(b)(1) of the Act, 21 U.S.C. section 360bbb-3(b)(1), unless the authorization is terminated or revoked.  Performed at [facility], [address], [place],  [address]  Please note: You were cared for by a hospitalist during your hospital stay. Once you are discharged, your primary care physician will handle any further medical issues. Please note that NO REFILLS for any discharge medications will be authorized once you are discharged, as it is imperative that you return to your primary care physician (or establish a relationship with a primary care physician if you do not have one) for your post hospital discharge needs so that they can reassess your need for medications and monitor your lab values.    Time coordinating discharge: 40 minutes  SIGNED:   Shelly Coss, MD  [facility] 11/24/2020, 1:34 PM Pager [phone]  If 7PM-7AM, please contact night-coverage [email or web address] Password [email or web address]

## 2020-12-07 DIAGNOSIS — Z09 Encounter for follow-up examination after completed treatment for conditions other than malignant neoplasm: Secondary | ICD-10-CM | POA: Diagnosis not present

## 2020-12-07 DIAGNOSIS — R9389 Abnormal findings on diagnostic imaging of other specified body structures: Secondary | ICD-10-CM | POA: Diagnosis not present

## 2020-12-07 DIAGNOSIS — G459 Transient cerebral ischemic attack, unspecified: Secondary | ICD-10-CM | POA: Diagnosis not present

## 2020-12-07 DIAGNOSIS — R899 Unspecified abnormal finding in specimens from other organs, systems and tissues: Secondary | ICD-10-CM | POA: Diagnosis not present

## 2020-12-07 DIAGNOSIS — G4700 Insomnia, unspecified: Secondary | ICD-10-CM | POA: Diagnosis not present

## 2020-12-07 DIAGNOSIS — I679 Cerebrovascular disease, unspecified: Secondary | ICD-10-CM | POA: Diagnosis not present

## 2020-12-08 ENCOUNTER — Other Ambulatory Visit: Payer: Self-pay | Admitting: Family Medicine

## 2020-12-08 DIAGNOSIS — R911 Solitary pulmonary nodule: Secondary | ICD-10-CM

## 2020-12-08 DIAGNOSIS — R9389 Abnormal findings on diagnostic imaging of other specified body structures: Secondary | ICD-10-CM

## 2020-12-31 ENCOUNTER — Ambulatory Visit
Admission: RE | Admit: 2020-12-31 | Discharge: 2020-12-31 | Disposition: A | Discharge status: Home / Self Care | Payer: Medicare Other | Source: Ambulatory Visit | Attending: Family Medicine | Admitting: Family Medicine

## 2020-12-31 ENCOUNTER — Other Ambulatory Visit: Payer: Self-pay

## 2020-12-31 DIAGNOSIS — R911 Solitary pulmonary nodule: Secondary | ICD-10-CM | POA: Diagnosis not present

## 2020-12-31 DIAGNOSIS — I700 Atherosclerosis of aorta: Secondary | ICD-10-CM | POA: Diagnosis not present

## 2020-12-31 DIAGNOSIS — R9389 Abnormal findings on diagnostic imaging of other specified body structures: Secondary | ICD-10-CM

## 2020-12-31 DIAGNOSIS — R918 Other nonspecific abnormal finding of lung field: Secondary | ICD-10-CM | POA: Diagnosis not present

## 2020-12-31 DIAGNOSIS — J9811 Atelectasis: Secondary | ICD-10-CM | POA: Diagnosis not present

## 2021-01-06 DIAGNOSIS — G4700 Insomnia, unspecified: Secondary | ICD-10-CM | POA: Diagnosis not present

## 2021-01-06 DIAGNOSIS — Z23 Encounter for immunization: Secondary | ICD-10-CM | POA: Diagnosis not present

## 2021-01-06 DIAGNOSIS — E11319 Type 2 diabetes mellitus with unspecified diabetic retinopathy without macular edema: Secondary | ICD-10-CM | POA: Diagnosis not present

## 2021-01-25 ENCOUNTER — Ambulatory Visit: Payer: Federal, State, Local not specified - PPO

## 2021-02-14 ENCOUNTER — Other Ambulatory Visit (HOSPITAL_BASED_OUTPATIENT_CLINIC_OR_DEPARTMENT_OTHER): Payer: Self-pay

## 2021-02-14 ENCOUNTER — Ambulatory Visit: Payer: Medicare Other | Attending: Internal Medicine

## 2021-02-14 DIAGNOSIS — Z23 Encounter for immunization: Secondary | ICD-10-CM

## 2021-02-14 MED ORDER — PFIZER COVID-19 VAC BIVALENT 30 MCG/0.3ML IM SUSP
INTRAMUSCULAR | 0 refills | Status: DC
  Filled 2021-02-14: qty 0.3, 1d supply, fill #0

## 2021-02-14 NOTE — Progress Notes (Signed)
   Covid-19 Vaccination Clinic  Name:  Vincent Alvarez    MRN: [id number] DOB: 05/08/1940  02/14/2021  [name] was observed post Covid-19 immunization for 15 minutes without incident. He was provided with Vaccine Information Sheet and instruction to access the V-Safe system.   [name] was instructed to call 911 with any severe reactions post vaccine: Difficulty breathing  Swelling of face and throat  A fast heartbeat  A bad rash all over body  Dizziness and weakness   Immunizations Administered     Name Date Dose VIS Date Route   Pfizer Covid-19 Vaccine Bivalent Booster 02/14/2021  1:31 PM 0.3 mL 12/15/2020 Intramuscular   Manufacturer: [place]   Lot: [id number]   [place]: [phone]

## 2021-02-18 ENCOUNTER — Other Ambulatory Visit: Payer: Self-pay

## 2021-02-18 ENCOUNTER — Observation Stay (HOSPITAL_COMMUNITY)
Admission: EM | Admit: 2021-02-18 | Discharge: 2021-02-20 | Disposition: A | Discharge status: Home / Self Care | Payer: Medicare Other | Attending: Emergency Medicine | Admitting: Emergency Medicine

## 2021-02-18 ENCOUNTER — Encounter (HOSPITAL_COMMUNITY): Payer: Self-pay

## 2021-02-18 ENCOUNTER — Emergency Department (HOSPITAL_COMMUNITY): Payer: Medicare Other

## 2021-02-18 DIAGNOSIS — Z79899 Other long term (current) drug therapy: Secondary | ICD-10-CM | POA: Insufficient documentation

## 2021-02-18 DIAGNOSIS — Y900 Blood alcohol level of less than 20 mg/100 ml: Secondary | ICD-10-CM | POA: Diagnosis not present

## 2021-02-18 DIAGNOSIS — I10 Essential (primary) hypertension: Secondary | ICD-10-CM | POA: Diagnosis not present

## 2021-02-18 DIAGNOSIS — I639 Cerebral infarction, unspecified: Principal | ICD-10-CM | POA: Insufficient documentation

## 2021-02-18 DIAGNOSIS — Z20822 Contact with and (suspected) exposure to covid-19: Secondary | ICD-10-CM | POA: Diagnosis not present

## 2021-02-18 DIAGNOSIS — G319 Degenerative disease of nervous system, unspecified: Secondary | ICD-10-CM | POA: Diagnosis not present

## 2021-02-18 DIAGNOSIS — Z7984 Long term (current) use of oral hypoglycemic drugs: Secondary | ICD-10-CM | POA: Diagnosis not present

## 2021-02-18 DIAGNOSIS — Z87891 Personal history of nicotine dependence: Secondary | ICD-10-CM | POA: Insufficient documentation

## 2021-02-18 DIAGNOSIS — R202 Paresthesia of skin: Secondary | ICD-10-CM | POA: Diagnosis not present

## 2021-02-18 DIAGNOSIS — I6381 Other cerebral infarction due to occlusion or stenosis of small artery: Secondary | ICD-10-CM | POA: Diagnosis not present

## 2021-02-18 DIAGNOSIS — I672 Cerebral atherosclerosis: Secondary | ICD-10-CM | POA: Diagnosis not present

## 2021-02-18 DIAGNOSIS — E119 Type 2 diabetes mellitus without complications: Secondary | ICD-10-CM | POA: Insufficient documentation

## 2021-02-18 DIAGNOSIS — R200 Anesthesia of skin: Secondary | ICD-10-CM | POA: Diagnosis present

## 2021-02-18 DIAGNOSIS — R4781 Slurred speech: Secondary | ICD-10-CM | POA: Diagnosis not present

## 2021-02-18 DIAGNOSIS — R29818 Other symptoms and signs involving the nervous system: Secondary | ICD-10-CM | POA: Diagnosis not present

## 2021-02-18 DIAGNOSIS — E785 Hyperlipidemia, unspecified: Secondary | ICD-10-CM | POA: Diagnosis present

## 2021-02-18 DIAGNOSIS — H538 Other visual disturbances: Secondary | ICD-10-CM | POA: Diagnosis not present

## 2021-02-18 DIAGNOSIS — I739 Peripheral vascular disease, unspecified: Secondary | ICD-10-CM | POA: Diagnosis not present

## 2021-02-18 LAB — DIFFERENTIAL
Abs Immature Granulocytes: 0.02 10*3/uL (ref 0.00–0.07)
Basophils Absolute: 0.1 10*3/uL (ref 0.0–0.1)
Basophils Relative: 1 %
Eosinophils Absolute: 0.2 10*3/uL (ref 0.0–0.5)
Eosinophils Relative: 3 %
Immature Granulocytes: 0 %
Lymphocytes Relative: 28 %
Lymphs Abs: 1.9 10*3/uL (ref 0.7–4.0)
Monocytes Absolute: 0.7 10*3/uL (ref 0.1–1.0)
Monocytes Relative: 10 %
Neutro Abs: 4.1 10*3/uL (ref 1.7–7.7)
Neutrophils Relative %: 58 %

## 2021-02-18 LAB — COMPREHENSIVE METABOLIC PANEL
ALT: 15 U/L (ref 0–44)
AST: 16 U/L (ref 15–41)
Albumin: 3.8 g/dL (ref 3.5–5.0)
Alkaline Phosphatase: 68 U/L (ref 38–126)
Anion gap: 10 (ref 5–15)
BUN: 10 mg/dL (ref 8–23)
CO2: 27 mmol/L (ref 22–32)
Calcium: 9.8 mg/dL (ref 8.9–10.3)
Chloride: 98 mmol/L (ref 98–111)
Creatinine, Ser: 1.05 mg/dL (ref 0.61–1.24)
GFR, Estimated: >60 mL/min (ref >60)
Glucose, Bld: 125 mg/dL — ABNORMAL HIGH (ref 70–99)
Potassium: 4.2 mmol/L (ref 3.5–5.1)
Sodium: 135 mmol/L (ref 135–145)
Total Bilirubin: 0.6 mg/dL (ref 0.3–1.2)
Total Protein: 6.8 g/dL (ref 6.5–8.1)

## 2021-02-18 LAB — CBC
HCT: 39 % (ref 39.0–52.0)
Hemoglobin: 12.9 g/dL — ABNORMAL LOW (ref 13.0–17.0)
MCH: 30.4 pg (ref 26.0–34.0)
MCHC: 33.1 g/dL (ref 30.0–36.0)
MCV: 91.8 fL (ref 80.0–100.0)
Platelets: 278 10*3/uL (ref 150–400)
RBC: 4.25 MIL/uL (ref 4.22–5.81)
RDW: 13.2 % (ref 11.5–15.5)
WBC: 6.9 10*3/uL (ref 4.0–10.5)
nRBC: 0 % (ref 0.0–0.2)

## 2021-02-18 LAB — URINALYSIS, ROUTINE W REFLEX MICROSCOPIC
Bilirubin Urine: NEGATIVE
Glucose, UA: NEGATIVE mg/dL
Hgb urine dipstick: NEGATIVE
Ketones, ur: 5 mg/dL — AB
Leukocytes,Ua: NEGATIVE
Nitrite: NEGATIVE
Protein, ur: NEGATIVE mg/dL
Specific Gravity, Urine: 1.006 (ref 1.005–1.030)
pH: 5 (ref 5.0–8.0)

## 2021-02-18 LAB — APTT: aPTT: 30 seconds (ref 24–36)

## 2021-02-18 LAB — I-STAT CHEM 8, ED
BUN: 12 mg/dL (ref 8–23)
Calcium, Ion: 1.23 mmol/L (ref 1.15–1.40)
Chloride: 96 mmol/L — ABNORMAL LOW (ref 98–111)
Creatinine, Ser: 1.1 mg/dL (ref 0.61–1.24)
Glucose, Bld: 122 mg/dL — ABNORMAL HIGH (ref 70–99)
HCT: 39 % (ref 39.0–52.0)
Hemoglobin: 13.3 g/dL (ref 13.0–17.0)
Potassium: 4 mmol/L (ref 3.5–5.1)
Sodium: 135 mmol/L (ref 135–145)
TCO2: 28 mmol/L (ref 22–32)

## 2021-02-18 LAB — PROTIME-INR
INR: 1 (ref 0.8–1.2)
Prothrombin Time: 13.2 seconds (ref 11.4–15.2)

## 2021-02-18 LAB — RESP PANEL BY RT-PCR (FLU A&B, COVID) ARPGX2
Influenza A by PCR: NEGATIVE
Influenza B by PCR: NEGATIVE
SARS Coronavirus 2 by RT PCR: NEGATIVE

## 2021-02-18 LAB — ETHANOL: Alcohol, Ethyl (B): <10 mg/dL (ref <10)

## 2021-02-18 NOTE — ED Provider Notes (Signed)
Emergency Medicine Provider Triage Evaluation Note  Vincent Alvarez , a 80 y.o. male  was evaluated in triage.  Pt complains of left hand numbness and tingling and mouth numbness and tingling.  Patient states symptoms began around 715 tonight.  Hand numbness resolved quickly, however mouth numbness lasted for little bit longer.  He reports all symptoms have completely resolved except numbness at the very tip of his tongue.  He reports dizziness.  No chest pain.  No fevers.  No slurred speech or vision changes.  Review of Systems  Positive: L hand and mouth numbness, resolved Negative: cp  Physical Exam  BP (!) 147/77   Pulse 91   Temp 97.7 F (36.5 C) (Oral)   Resp 16   SpO2 98%  Gen:   Awake, no distress   Resp:  Normal effort  MSK:   Moves extremities without difficulty  Other:  CN intact.  Grip strength equal bilaterally.  Sensation of the face, upper extremities, lower extremities equal bilaterally.  Negative pronator drift.  Medical Decision Making  Medically screening exam initiated at 8:31 PM.  Appropriate orders placed.  ERAN MISTRY was informed that the remainder of the evaluation will be completed by another provider, this initial triage assessment does not replace that evaluation, and the importance of remaining in the ED until their evaluation is complete.  Patient presenting for evaluation of left hand and face numbness.  Symptoms resolved almost completely on arrival.  Exam neuro exam is negative.  As such, code stroke was not called, however stroke order set ordered to assess for CVA versus TIA. Discussed with attending, Dr. Pearline Cables agrees to plan.    Franchot Heidelberg, PA-C 02/18/21 2034    Jeanell Sparrow, DO 02/18/21 2159

## 2021-02-18 NOTE — ED Notes (Signed)
Pt continues to have some numbness on L side of tongue, headache and dizziness. PA discussed with MD in triage, not a code stroke at this time

## 2021-02-18 NOTE — ED Triage Notes (Signed)
Pt comes via [facility] for mouth numbness and L hand numbness that started at 1915, symptoms have now resolved, neuro intact bilaterally, pt also c/o of dizziness

## 2021-02-19 ENCOUNTER — Other Ambulatory Visit: Payer: Self-pay

## 2021-02-19 ENCOUNTER — Observation Stay (HOSPITAL_COMMUNITY): Payer: Medicare Other

## 2021-02-19 ENCOUNTER — Encounter (HOSPITAL_COMMUNITY): Payer: Self-pay | Admitting: Family Medicine

## 2021-02-19 DIAGNOSIS — I10 Essential (primary) hypertension: Secondary | ICD-10-CM

## 2021-02-19 DIAGNOSIS — I639 Cerebral infarction, unspecified: Secondary | ICD-10-CM | POA: Diagnosis not present

## 2021-02-19 DIAGNOSIS — I63233 Cerebral infarction due to unspecified occlusion or stenosis of bilateral carotid arteries: Secondary | ICD-10-CM | POA: Diagnosis not present

## 2021-02-19 DIAGNOSIS — E11319 Type 2 diabetes mellitus with unspecified diabetic retinopathy without macular edema: Secondary | ICD-10-CM

## 2021-02-19 DIAGNOSIS — R29818 Other symptoms and signs involving the nervous system: Secondary | ICD-10-CM | POA: Diagnosis not present

## 2021-02-19 DIAGNOSIS — I771 Stricture of artery: Secondary | ICD-10-CM | POA: Diagnosis not present

## 2021-02-19 DIAGNOSIS — E785 Hyperlipidemia, unspecified: Secondary | ICD-10-CM

## 2021-02-19 DIAGNOSIS — I672 Cerebral atherosclerosis: Secondary | ICD-10-CM | POA: Diagnosis not present

## 2021-02-19 DIAGNOSIS — M47812 Spondylosis without myelopathy or radiculopathy, cervical region: Secondary | ICD-10-CM | POA: Diagnosis not present

## 2021-02-19 LAB — HEMOGLOBIN A1C
Hgb A1c MFr Bld: 6.7 % — ABNORMAL HIGH (ref 4.8–5.6)
Mean Plasma Glucose: 145.59 mg/dL

## 2021-02-19 LAB — CBG MONITORING, ED
Glucose-Capillary: 160 mg/dL — ABNORMAL HIGH (ref 70–99)
Glucose-Capillary: 191 mg/dL — ABNORMAL HIGH (ref 70–99)
Glucose-Capillary: 122 mg/dL — ABNORMAL HIGH (ref 70–99)

## 2021-02-19 LAB — GLUCOSE, CAPILLARY: Glucose-Capillary: 180 mg/dL — ABNORMAL HIGH (ref 70–99)

## 2021-02-19 MED ORDER — ASPIRIN EC 81 MG PO TBEC
81.0000 mg | DELAYED_RELEASE_TABLET | Freq: Every day | ORAL | Status: DC
  Administered 2021-02-19 – 2021-02-20 (×2): 81 mg via ORAL
  Filled 2021-02-19 (×2): qty 1

## 2021-02-19 MED ORDER — FLUOXETINE HCL 20 MG PO CAPS
20.0000 mg | ORAL_CAPSULE | ORAL | Status: DC
  Administered 2021-02-19 – 2021-02-20 (×2): 20 mg via ORAL
  Filled 2021-02-19 (×2): qty 1

## 2021-02-19 MED ORDER — ACETAMINOPHEN 325 MG PO TABS: 650.0000 mg | ORAL_TABLET | ORAL | Status: DC | PRN

## 2021-02-19 MED ORDER — TAMSULOSIN HCL 0.4 MG PO CAPS
0.4000 mg | ORAL_CAPSULE | Freq: Every day | ORAL | Status: DC
  Administered 2021-02-19: 0.4 mg via ORAL
  Filled 2021-02-19: qty 1

## 2021-02-19 MED ORDER — LOSARTAN POTASSIUM 50 MG PO TABS
100.0000 mg | ORAL_TABLET | Freq: Every day | ORAL | Status: DC
  Administered 2021-02-20: 100 mg via ORAL
  Filled 2021-02-19: qty 2

## 2021-02-19 MED ORDER — ACETAMINOPHEN 650 MG RE SUPP: 650.0000 mg | RECTAL | Status: DC | PRN

## 2021-02-19 MED ORDER — AMLODIPINE BESYLATE 5 MG PO TABS
5.0000 mg | ORAL_TABLET | ORAL | Status: DC
  Administered 2021-02-19 – 2021-02-20 (×2): 5 mg via ORAL
  Filled 2021-02-19 (×2): qty 1

## 2021-02-19 MED ORDER — ATORVASTATIN CALCIUM 80 MG PO TABS
80.0000 mg | ORAL_TABLET | ORAL | Status: DC
  Administered 2021-02-19 – 2021-02-20 (×2): 80 mg via ORAL
  Filled 2021-02-19: qty 2
  Filled 2021-02-19: qty 1

## 2021-02-19 MED ORDER — SENNOSIDES-DOCUSATE SODIUM 8.6-50 MG PO TABS: 1.0000 | ORAL_TABLET | Freq: Every evening | ORAL | Status: DC | PRN

## 2021-02-19 MED ORDER — INSULIN ASPART 100 UNIT/ML IJ SOLN
0.0000 [IU] | Freq: Three times a day (TID) | INTRAMUSCULAR | Status: DC
  Administered 2021-02-19 (×2): 2 [IU] via SUBCUTANEOUS
  Administered 2021-02-19: 1 [IU] via SUBCUTANEOUS
  Administered 2021-02-19 – 2021-02-20 (×3): 2 [IU] via SUBCUTANEOUS

## 2021-02-19 MED ORDER — TRAZODONE HCL 50 MG PO TABS
50.0000 mg | ORAL_TABLET | Freq: Every day | ORAL | Status: DC
  Administered 2021-02-19: 50 mg via ORAL
  Filled 2021-02-19: qty 1

## 2021-02-19 MED ORDER — IOHEXOL 350 MG/ML SOLN
75.0000 mL | Freq: Once | INTRAVENOUS | Status: AC | PRN
  Administered 2021-02-19: 75 mL via INTRAVENOUS

## 2021-02-19 MED ORDER — LINAGLIPTIN 5 MG PO TABS
5.0000 mg | ORAL_TABLET | Freq: Every day | ORAL | Status: DC
  Administered 2021-02-19 – 2021-02-20 (×2): 5 mg via ORAL
  Filled 2021-02-19 (×3): qty 1

## 2021-02-19 MED ORDER — ACETAMINOPHEN 160 MG/5ML PO SOLN: 650.0000 mg | ORAL | Status: DC | PRN

## 2021-02-19 MED ORDER — STROKE: EARLY STAGES OF RECOVERY BOOK
Freq: Once | Status: AC
  Filled 2021-02-19: qty 1

## 2021-02-19 MED ORDER — CLOPIDOGREL BISULFATE 75 MG PO TABS
75.0000 mg | ORAL_TABLET | Freq: Every day | ORAL | Status: DC
  Administered 2021-02-19 – 2021-02-20 (×2): 75 mg via ORAL
  Filled 2021-02-19 (×3): qty 1

## 2021-02-19 NOTE — Plan of Care (Signed)
  Problem: Education: Goal: Knowledge of General Education information will improve Description Including pain rating scale, medication(s)/side effects and non-pharmacologic comfort measures Outcome: Progressing   Problem: Health Behavior/Discharge Planning: Goal: Ability to manage health-related needs will improve Outcome: Progressing   

## 2021-02-19 NOTE — ED Provider Notes (Signed)
[facility] EMERGENCY DEPARTMENT Provider Note   CSN: [id number] Arrival date & time: 02/18/21  2021     History Chief Complaint  Patient presents with   Numbness    Vincent Alvarez is a 80 y.o. male.  Patient with a history of diabetes, hypertension, previous stroke presenting with numbness and tingling to his left face, jaw, tongue, chin as well as left hand.  Symptoms started about 7:15 PM while he was at rest.  About 30 minutes prior to this he noted some blurry vision in his left eye which is now resolved.  His numbness is now back to baseline and feels normal.  He thinks it lasted about 30 minutes.  Did not have any weakness in his hand.  He has a gradual onset headache currently.  No difficulty speaking.  No weakness in his hand Or leg. History of previous stroke on Plavix with no residual deficits. Code stroke was not activated by triage team due to recent resolved symptoms  The history is provided by the patient.      Past Medical History:  Diagnosis Date   Atypical chest pain    Depression    Diabetes mellitus without complication ([place])    Diabetic retinopathy ([place])    Gout    Hearing loss    Hyperlipidemia    Hypertension    Onychomycosis    UTI (lower urinary tract infection)     Patient Active Problem List   Diagnosis Date Noted   CVA (cerebral vascular accident) ([place]) 11/23/2020   Dysarthria 11/23/2020   Normocytic anemia 11/23/2020   Right sided numbness 11/22/2020   TIA (transient ischemic attack) 12/18/2015   Diabetes mellitus, type 2 ([place]) 12/18/2015   Essential hypertension 12/18/2015   Hyperlipidemia 12/18/2015   Depression 12/18/2015    Past Surgical History:  Procedure Laterality Date   CATARACT EXTRACTION, BILATERAL     CHOLECYSTECTOMY     COLONOSCOPY     TONSILLECTOMY         Family History  Problem Relation Age of Onset   CAD Mother 75   Colon cancer Father 28    Social History   Tobacco Use   Smoking  status: Former    Types: Cigarettes    Quit date: 06/18/1985    Years since quitting: 35.6   Smokeless tobacco: Never  Substance Use Topics   Alcohol use: Yes    Alcohol/week: 2.0 standard drinks    Types: 2 Glasses of wine per week    Comment: thinks he may be drinking too much, 2/4 + CAGE questions   Drug use: No    Home Medications Prior to Admission medications   Medication Sig Start Date End Date Taking? Authorizing Provider  amLODipine (NORVASC) 5 MG tablet Take 5 mg by mouth every morning. 12/05/15   [provider]  atorvastatin (LIPITOR) 20 MG tablet Take 20 mg by mouth every morning.     [provider]  B Complex Vitamins (B COMPLEX PO) Take 1 tablet by mouth daily.    [provider]  clopidogrel (PLAVIX) 75 MG tablet Take 1 tablet (75 mg total) by mouth daily. Continue taking this medication 11/25/20   Shelly Coss, MD  COVID-19 mRNA bivalent vaccine, Pfizer, (PFIZER COVID-19 VAC BIVALENT) injection Inject into the muscle. 02/14/21   Carlyle Basques, MD  COVID-19 mRNA Vac-TriS, Pfizer, (PFIZER-BIONT COVID-19 VAC-TRIS) SUSP injection Inject into the muscle. 09/21/20   Carlyle Basques, MD  FLUoxetine (PROZAC) 20 MG capsule Take  20 mg by mouth every morning.     [provider]  fluticasone (FLONASE) 50 MCG/ACT nasal spray Place 1 spray into both nostrils daily as needed for allergies.  09/14/15   [provider]  losartan (COZAAR) 100 MG tablet Take 100 mg by mouth daily. 10/30/20   [provider]  metFORMIN (GLUCOPHAGE) 500 MG tablet Take 1,000 mg by mouth 2 (two) times daily with a meal.     [provider]  sitaGLIPtin (JANUVIA) 100 MG tablet Take 100 mg by mouth daily.    [provider]  tamsulosin (FLOMAX) 0.4 MG CAPS capsule Take 0.4 mg by mouth daily after supper.     [provider]  traZODone (DESYREL) 50 MG tablet Take 50 mg by mouth at bedtime.    [provider]  vitamin B-12  (CYANOCOBALAMIN) 250 MCG tablet Take 250 mcg by mouth daily.    [provider]    Allergies    Shrimp [shellfish allergy]  Review of Systems   Review of Systems  Constitutional:  Negative for activity change, appetite change and fever.  HENT:  Negative for congestion and rhinorrhea.   Eyes:  Negative for visual disturbance.  Respiratory:  Negative for cough, chest tightness and shortness of breath.   Cardiovascular:  Negative for chest pain.  Gastrointestinal:  Negative for abdominal pain, nausea and vomiting.  Genitourinary:  Negative for dysuria and hematuria.  Musculoskeletal:  Negative for arthralgias and myalgias.  Skin:  Negative for rash.  Neurological:  Positive for dizziness, numbness and headaches. Negative for weakness.   all other systems are negative except as noted in the HPI and PMH.   Physical Exam Updated Vital Signs BP (!) 165/80 (BP Location: Left Arm)   Pulse 71   Temp 98.5 F (36.9 C) (Oral)   Resp 16   SpO2 100%   Physical Exam Vitals and nursing note reviewed.  Constitutional:      General: He is not in acute distress.    Appearance: He is well-developed.  HENT:     Head: Normocephalic and atraumatic.     Mouth/Throat:     Pharynx: No oropharyngeal exudate.  Eyes:     Conjunctiva/sclera: Conjunctivae normal.     Pupils: Pupils are equal, round, and reactive to light.  Neck:     Comments: No meningismus. Cardiovascular:     Rate and Rhythm: Normal rate and regular rhythm.     Heart sounds: Normal heart sounds. No murmur heard. Pulmonary:     Effort: Pulmonary effort is normal. No respiratory distress.     Breath sounds: Normal breath sounds.  Abdominal:     Palpations: Abdomen is soft.     Tenderness: There is no abdominal tenderness. There is no guarding or rebound.  Musculoskeletal:        General: No tenderness. Normal range of motion.     Cervical back: Normal range of motion and neck supple.  Skin:    General: Skin is warm.   Neurological:     Mental Status: He is alert and oriented to person, place, and time.     Cranial Nerves: No cranial nerve deficit.     Motor: No abnormal muscle tone.     Coordination: Coordination normal.     Comments: CN 2-12 intact, no ataxia on finger to nose, no nystagmus, 5/5 strength throughout, no pronator drift, Subjectively intact sensation bilaterally.    Psychiatric:        Behavior: Behavior normal.  ED Results / Procedures / Treatments   Labs (all labs ordered are listed, but only abnormal results are displayed) Labs Reviewed  CBC - Abnormal; Notable for the following components:      Result Value   Hemoglobin 12.9 (*)    All other components within normal limits  COMPREHENSIVE METABOLIC PANEL - Abnormal; Notable for the following components:   Glucose, Bld 125 (*)    All other components within normal limits  URINALYSIS, ROUTINE W REFLEX MICROSCOPIC - Abnormal; Notable for the following components:   APPearance HAZY (*)    Ketones, ur 5 (*)    All other components within normal limits  I-STAT CHEM 8, ED - Abnormal; Notable for the following components:   Chloride 96 (*)    Glucose, Bld 122 (*)    All other components within normal limits  RESP PANEL BY RT-PCR (FLU A&B, COVID) ARPGX2  ETHANOL  PROTIME-INR  APTT  DIFFERENTIAL  RAPID URINE DRUG SCREEN, HOSP PERFORMED  HEMOGLOBIN A1C  HEMOGLOBIN A1C    EKG EKG Interpretation  Date/Time:  Friday February 18 2021 20:20:40 EDT Ventricular Rate:  86 PR Interval:  150 QRS Duration: 128 QT Interval:  392 QTC Calculation: 469 R Axis:   -88 Text Interpretation: Normal sinus rhythm Right bundle branch block Left anterior fascicular block * Bifascicular block * Possible Lateral infarct , age undetermined Abnormal ECG No significant change was found Confirmed by Ezequiel Essex [phone]) on 02/19/2021 2:24:03 AM  Radiology CT HEAD WO CONTRAST  Result Date: 02/18/2021 CLINICAL DATA:  Acute neuro deficit,  stroke suspected EXAM: CT HEAD WITHOUT CONTRAST TECHNIQUE: Contiguous axial images were obtained from the base of the skull through the vertex without intravenous contrast. COMPARISON:  Head CT 11/22/2020, MRI 11/24/2020 FINDINGS: Brain: There is moderately advanced atrophy, with atrophic ventriculomegaly and moderate to severe small vessel disease the cerebral white matter. Chronic lacunar infarcts are again noted in both gangliocapsular areas, left thalamus. The cerebellum and brainstem are unremarkable. There is no midline shift. No focal asymmetry is seen worrisome for acute infarct, hemorrhage or mass. Small chronic right occipito-temporal cortical infarct is again noted on axial 14 . Vascular: There calcifications in the carotid siphons, distal right vertebral artery. No hyperdense central vessel is seen. Skull: Normal. Negative for fracture or focal lesion. Sinuses/Orbits: The orbits are intact, with old lens extractions again noted. There is mild membrane disease in the paranasal sinuses without fluid levels. There is a 1 cm retention cyst or polyp in the right sphenoid air cell. There is no mastoid or middle ear effusion. Other: None. IMPRESSION: Atrophy with advanced small vessel disease and multiple lacunar infarctions with chronic appearance. No new focal asymmetry is seen concerning for cortical based infarct or bleed. Advanced small vessel disease limits assessment for isolated white matter ischemia on CT however, and follow-up with MRI is recommended if there is concern for occult infarct. Electronically Signed   By: Telford Nab M.D.   On: 02/18/2021 21:09   MR BRAIN WO CONTRAST  Result Date: 02/18/2021 CLINICAL DATA:  Acute neurologic deficit EXAM: MRI HEAD WITHOUT CONTRAST TECHNIQUE: Multiplanar, multiecho pulse sequences of the brain and surrounding structures were obtained without intravenous contrast. COMPARISON:  11/23/2020 FINDINGS: Brain: There is a punctate focus of abnormal diffusion  restriction in the left occipital lobe (series 5, image 70). No other diffusion abnormality. No acute or chronic hemorrhage. Hyperintense T2-weighted signal is moderately widespread throughout the white matter. Generalized volume loss without a clear lobar predilection. The  midline structures are normal. Vascular: Major flow voids are preserved. Skull and upper cervical spine: Normal calvarium and skull base. Visualized upper cervical spine and soft tissues are normal. Sinuses/Orbits:No paranasal sinus fluid levels or advanced mucosal thickening. No mastoid or middle ear effusion. Normal orbits. IMPRESSION: 1. Punctate focus of acute ischemia in the left occipital lobe. No hemorrhage or mass effect. 2. Moderate chronic small vessel ischemic disease and volume loss. Cerebral Atrophy (ICD10-G31.9). Electronically Signed   By: Ulyses Jarred M.D.   On: 02/18/2021 22:16    Procedures Procedures   Medications Ordered in ED Medications - No data to display  ED Course  I have reviewed the triage vital signs and the nursing notes.  Pertinent labs & imaging results that were available during my care of the patient were reviewed by me and considered in my medical decision making (see chart for details).    MDM Rules/Calculators/A&P                          Numbness to left face and hand now resolved.  Work-up in triage is remarkable for acute infarction left occipital lobe.  Discussed with Dr. Leonel Ramsay of neurology who is concerned about emboli phenomenon.  Dr. Leonel Ramsay is seen patient.  He feels patient will need admission despite his stroke work-up in August.  Recommends CTA as well as repeat echo and possible TEE.  Admission discussed with Dr. Tonie Griffith.  Final Clinical Impression(s) / ED Diagnoses Final diagnoses:  Cerebrovascular accident (CVA), unspecified mechanism ([place])    Rx / [place] Orders ED Discharge Orders     None        Sharlene Mccluskey, Annie Main, MD 02/19/21 [phone]

## 2021-02-19 NOTE — ED Notes (Signed)
PT at BS.

## 2021-02-19 NOTE — Progress Notes (Signed)
PROGRESS NOTE    Vincent Alvarez  BTD:[id number] DOB: [birth year] DOA: 02/18/2021 PCP: Lawerance Cruel, MD    Brief Narrative:  Vincent Alvarez was admitted to the hospital with the working diagnosis of acute CVA.   80 year old male past medical history for hypertension, type II [place] mellitus, CVA and dyslipidemia who presented with numbness in the left hand and left side of the face.  He was sitting watching TV when suddenly developed left hand numbness and left-sided facial jaw numbness including his tongue.  About 4 days prior he had an episode of blurry vision and self resolved.  Because of the symptoms he presented to the hospital for further evaluation.  On his initial physical examination his blood pressure was 149/76, heart rate 79, respiratory rate 18, oxygen saturation 98%, his lungs were clear to auscultation bilaterally, heart S1-S2, present, rhythmic, soft abdomen, lower extremity edema.  Sodium 135, potassium 4.2, chloride 98, bicarb 27, glucose 125, BUN 10, creatinine 1.0, white count 6.9, hemoglobin 12.9, hematocrit 39.0, platelets 278. SARS COVID-19 negative.  Urinalysis [place] gravity 1.006.  Head CT with advanced small vessel disease and multiple lacunar infarctions of chronic appearance.  No new focal asymmetry seen. Head and neck CT angiography with negative large vessel occlusion.  Brain MRI with punctate focus of acute ischemia in the left occipital lobe.  No hemorrhage or mass-effect.  EKG 86 bpm left axis deviation, left anterior fascicular block, right bundle branch block, sinus rhythm with poor R wave progression, no significant ST segment T wave changes.  Assessment & Plan:   Principal Problem:   CVA (cerebral vascular accident) ([place]) Active Problems:   Diabetes mellitus, type 2 ([place])   Essential hypertension   Hyperlipidemia   Acute left occipital lobe CVA. Patient with improvement in paraesthesias but balance still not back to baseline when  ambulating with physical therapy.  Plan to continue neuro checks. Pending echocardiogram and final recommendations from Neurology.  Continue medical therapy with aspirin and clopidogrel. Statin therapy with atorvastatin.   2. HTN. Continue blood pressure control with amlodipine and losartan.   3. T2DM. Continue glucose cover and monitoring with insulin sliding scale. Continue with linagliptin.   4. BPH. Continue with tamsulosin.    5. Depression. Continue with fluoxetine.   Patient continue to be at high risk for worsening CVA  Status is: Observation  The patient remains OBS appropriate and will d/c before 2 midnights.   DVT prophylaxis: Enoxaparin   Code Status:    full  Family Communication:  No family at the bedside     Consultants:  Neurology   Subjective: Patient is feeling better, paresthesias have improved but still balance not back to baseline while ambulating, no nausea or vomiting, no chest pain or dyspnea.   Objective: Vitals:   02/19/21 1130 02/19/21 1145 02/19/21 1200 02/19/21 1215  BP: (!) 128/109 125/67 (!) 141/77 139/73  Pulse: 74 78 72 70  Resp: 16 15 17  (!) 21  Temp:      TempSrc:      SpO2: 100% 97% 99% 96%    Intake/Output Summary (Last 24 hours) at 02/19/2021 1547 Last data filed at 02/19/2021 1200 Gross per 24 hour  Intake --  Output 1250 ml  Net -1250 ml   There were no vitals filed for this visit.  Examination:   General: Not in pain or dyspnea, Neurology: Awake and alert, strength upper and lower extremities is preserved.  E ENT: no pallor, no icterus, oral mucosa moist Cardiovascular:  No JVD. S1-S2 present, rhythmic, no gallops, rubs, or murmurs. No lower extremity edema. Pulmonary: vesicular breath sounds bilaterally, adequate air movement, no wheezing, rhonchi or rales. Gastrointestinal. Abdomen soft and non tender Skin. No rashes Musculoskeletal: no joint deformities     Data Reviewed: I have personally reviewed following  labs and imaging studies  CBC: Recent Labs  Lab 02/18/21 2039 02/18/21 2141  WBC 6.9  --   NEUTROABS 4.1  --   HGB 12.9* 13.3  HCT 39.0 39.0  MCV 91.8  --   PLT 278  --    Basic Metabolic Panel: Recent Labs  Lab 02/18/21 2039 02/18/21 2141  NA 135 135  K 4.2 4.0  CL 98 96*  CO2 27  --   GLUCOSE 125* 122*  BUN 10 12  CREATININE 1.05 1.10  CALCIUM 9.8  --    GFR: CrCl cannot be calculated (Unknown ideal weight.). Liver Function Tests: Recent Labs  Lab 02/18/21 2039  AST 16  ALT 15  ALKPHOS 68  BILITOT 0.6  PROT 6.8  ALBUMIN 3.8   No results for input(s): LIPASE, AMYLASE in the last 168 hours. No results for input(s): AMMONIA in the last 168 hours. Coagulation Profile: Recent Labs  Lab 02/18/21 2039  INR 1.0   Cardiac Enzymes: No results for input(s): CKTOTAL, CKMB, CKMBINDEX, TROPONINI in the last 168 hours. BNP (last 3 results) No results for input(s): PROBNP in the last 8760 hours. HbA1C: Recent Labs    02/19/21 0616  HGBA1C 6.7*   CBG: Recent Labs  Lab 02/19/21 0752 02/19/21 1157  GLUCAP 160* 191*   Lipid Profile: No results for input(s): CHOL, HDL, LDLCALC, TRIG, CHOLHDL, LDLDIRECT in the last 72 hours. Thyroid Function Tests: No results for input(s): TSH, T4TOTAL, FREET4, T3FREE, THYROIDAB in the last 72 hours. Anemia Panel: No results for input(s): VITAMINB12, FOLATE, FERRITIN, TIBC, IRON, RETICCTPCT in the last 72 hours.    Radiology Studies: I have reviewed all of the imaging during this hospital visit personally     Scheduled Meds:  amLODipine  5 mg Oral BH-q7a   aspirin EC  81 mg Oral Daily   atorvastatin  80 mg Oral BH-q7a   clopidogrel  75 mg Oral Daily   FLUoxetine  20 mg Oral BH-q7a   insulin aspart  0-9 Units Subcutaneous TID WC & HS   linagliptin  5 mg Oral Daily   [START ON 02/20/2021] losartan  100 mg Oral Daily   tamsulosin  0.4 mg Oral QPC supper   traZODone  50 mg Oral QHS   Continuous Infusions:   LOS: 0  days        Vincent Harrel Gerome Apley, MD

## 2021-02-19 NOTE — Evaluation (Addendum)
Physical Therapy Evaluation and Discharge Patient Details Name: Vincent Alvarez MRN: [id number] DOB: [birth year] Today's Date: 02/19/2021  History of Present Illness  Pt is a 80 y.o. M who presents 02/18/2021 with acute onset of left hand and face numbness and blurry vision in the left eye which resolved in a few minutes. Significant PMH: HTN, DM2, CVA, HLD.  Clinical Impression  Patient evaluated by Physical Therapy with no further acute PT needs identified. PTA, pt lives with his wife and is independent. Pt reports resolution of symptoms, although still feels mildly imbalanced. Pt ambulating 400 feet with no AD without physical difficulty. Scoring 20/24 on the Dynamic Gait Index, indicating he is not at high risk for falls. Education provided regarding generalized exercise recommendations. All education has been completed and the patient has no further questions. No follow-up Physical Therapy or equipment needs. PT is signing off. Thank you for this referral.      Recommendations for follow up therapy are one component of a multi-disciplinary discharge planning process, led by the attending physician.  Recommendations may be updated based on patient status, additional functional criteria and insurance authorization.  Follow Up Recommendations No PT follow up    Assistance Recommended at Discharge    Functional Status Assessment Patient has not had a recent decline in their functional status  Equipment Recommendations  None recommended by PT    Recommendations for Other Services       Precautions / Restrictions Precautions Precautions: Fall Restrictions Weight Bearing Restrictions: No      Mobility  Bed Mobility Overal bed mobility: Needs Assistance Bed Mobility: Supine to Sit;Sit to Supine     Supine to sit: Min guard Sit to supine: Min assist   General bed mobility comments: Assist likely due to being on stretcher; would likely not require assist on regular or hospital  bed    Transfers Overall transfer level: Modified independent Equipment used: None                    Ambulation/Gait Ambulation/Gait assistance: Modified independent (Device/Increase time) Gait Distance (Feet): 400 Feet Assistive device: None Gait Pattern/deviations: Step-through pattern;Decreased stride length Gait velocity: decreased   General Gait Details: slow and steady pace, no gross imbalance  Stairs            Wheelchair Mobility    Modified Rankin (Stroke Patients Only) Modified Rankin (Stroke Patients Only) Pre-Morbid Rankin Score: No symptoms Modified Rankin: No significant disability     Balance Overall balance assessment: Mild deficits observed, not formally tested                               Standardized Balance Assessment Standardized Balance Assessment : Dynamic Gait Index   Dynamic Gait Index Level Surface: Mild Impairment Change in Gait Speed: Mild Impairment Gait with Horizontal Head Turns: Normal Gait with Vertical Head Turns: Normal Gait and Pivot Turn: Normal Step Over Obstacle: Mild Impairment Step Around Obstacles: Normal Steps: Mild Impairment Total Score: 20       Pertinent Vitals/Pain Pain Assessment: No/denies pain    Home Living Family/patient expects to be discharged to:: Private residence Living Arrangements: Spouse/significant other Available Help at Discharge: Family;Available 24 hours/day Type of Home: House Home Access: Stairs to enter Entrance Stairs-Rails: Right Entrance Stairs-Number of Steps: 3   Home Layout: One level Home Equipment: Grab bars - tub/shower;Kasandra Knudsen - single point      Prior Function Prior  Level of Function : Independent/Modified Independent             Mobility Comments: doing yard work       Journalist, newspaper   Dominant Hand: Right    Extremity/Trunk Assessment   Upper Extremity Assessment Upper Extremity Assessment: Overall WFL for tasks assessed     Lower Extremity Assessment Lower Extremity Assessment: Overall WFL for tasks assessed       Communication   Communication: HOH;Other (comment) (slight word finding difficulties (noted this from prior admission as well))  Cognition Arousal/Alertness: Awake/alert Behavior During Therapy: WFL for tasks assessed/performed Overall Cognitive Status: Within Functional Limits for tasks assessed                                          General Comments      Exercises     Assessment/Plan    PT Assessment Patient does not need any further PT services  PT Problem List         PT Treatment Interventions      PT Goals (Current goals can be found in the Care Plan section)  Acute Rehab PT Goals Patient Stated Goal: return to driving if able PT Goal Formulation: All assessment and education complete, DC therapy    Frequency     Barriers to discharge        Co-evaluation               AM-PAC PT "6 Clicks" Mobility  Outcome Measure Help needed turning from your back to your side while in a flat bed without using bedrails?: None Help needed moving from lying on your back to sitting on the side of a flat bed without using bedrails?: A Little Help needed moving to and from a bed to a chair (including a wheelchair)?: None Help needed standing up from a chair using your arms (e.g., wheelchair or bedside chair)?: None Help needed to walk in hospital room?: None Help needed climbing 3-5 steps with a railing? : None 6 Click Score: 23    End of Session   Activity Tolerance: Patient tolerated treatment well Patient left: in bed;with call bell/phone within reach Nurse Communication: Mobility status PT Visit Diagnosis: Difficulty in walking, not elsewhere classified (R26.2)    Time: 9476-5465 PT Time Calculation (min) (ACUTE ONLY): 22 min   Charges:   PT Evaluation $PT Eval Low Complexity: [place], PT, DPT Acute Rehabilitation  Services Pager [phone] Office [phone]   Deno Etienne 02/19/2021, 4:49 PM

## 2021-02-19 NOTE — Consult Note (Signed)
Neurology Consultation Reason for Consult: Stroke Referring Physician: Rancour, S  CC: Numbness  History is obtained from: Patient  HPI: Vincent Alvarez is a 80 y.o. male who was in his normal state of health earlier today, when he had a transient episode of blurred vision.  He states that his left eye felt like it was just not seen correctly.  Sometime later, he then noticed that he had transient numbness of his left arm and face.  This also resolved.  He came into the emergency department where he was evaluated for stroke with an MRI which demonstrates an embolic appearing posterior circulation stroke   LKW: 1915 tpa given?: no, resolution of symptoms   ROS: A 14 point ROS was performed and is negative except as noted in the HPI.  Past Medical History:  Diagnosis Date   Atypical chest pain    Depression    Diabetes mellitus without complication (HCC)    Diabetic retinopathy ([place])    Gout    Hearing loss    Hyperlipidemia    Hypertension    Onychomycosis    UTI (lower urinary tract infection)      Family History  Problem Relation Age of Onset   CAD Mother 80   Colon cancer Father 18     Social History:  reports that he quit smoking about 35 years ago. He has never used smokeless tobacco. He reports current alcohol use of about 2.0 standard drinks per week. He reports that he does not use drugs.   Exam: Current vital signs: BP (!) 142/73   Pulse 68   Temp 98.5 F (36.9 C) (Oral)   Resp 17   SpO2 99%  Vital signs in last 24 hours: Temp:  [97.7 F (36.5 C)-98.5 F (36.9 C)] 98.5 F (36.9 C) (11/05 0125) Pulse Rate:  [68-91] 68 (11/05 0330) Resp:  [16-18] 17 (11/05 0330) BP: (142-165)/(73-80) 142/73 (11/05 0330) SpO2:  [98 %-100 %] 99 % (11/05 0330)   Physical Exam  Constitutional: Appears well-developed and well-nourished.  Psych: Affect appropriate to situation Eyes: No scleral injection HENT: No OP obstruction MSK: no joint deformities.   Cardiovascular: Normal rate and regular rhythm.  Respiratory: Effort normal, non-labored breathing GI: Soft.  No distension. There is no tenderness.  Skin: WDI  Neuro: Mental Status: Patient is awake, alert, oriented to person, place, month, year, and situation. Patient is able to give a clear and coherent history. No signs of aphasia or neglect Cranial Nerves: II: Visual Fields are full. Pupils are equal, round, and reactive to light.   III,IV, VI: EOMI without ptosis or diploplia.  V: Facial sensation is symmetric to temperature VII: Facial movement is symmetric.  VIII: hearing is intact to voice X: Uvula elevates symmetrically XI: Shoulder shrug is symmetric. XII: tongue is midline without atrophy or fasciculations.  Motor: Tone is normal. Bulk is normal. 5/5 strength was present in all four extremities.  Sensory: Sensation is symmetric to light touch and temperature in the arms and legs. Cerebellar: FNF and HKS are intact bilaterally      I have reviewed labs in epic and the results pertinent to this consultation are: Cr 1.1  I have reviewed the images obtained:MRI brain - punctate stroke in the left occipital region.   Impression: 80 year old male with embolic appearing stroke.  Especially since he has had two episodes today, I think further expedited work-up is prudent.  He recently did have some work-up, but given multiple episodes with different symptomatology  and the appearance on MRI I am concerned about emboli either artery to artery or cardioembolic.  He will need a further work-up for such.    Recommendations: - HgbA1c, fasting lipid panel - MRI of the brain without contrast - Frequent neuro checks - Echocardiogram - CTA head and neck - Prophylactic therapy-Antiplatelet med: Aspirin - dose 81mg  and plavix 75mg  daily  after 300mg  load  - Risk factor modification - Telemetry monitoring, would consider prolonged cardiac monitoring - PT consult, OT consult,  Speech consult - Stroke team to follow  Roland Rack, MD [facility] [phone]  If 7pm- 7am, please page neurology on call as listed in [place].

## 2021-02-19 NOTE — Progress Notes (Signed)
STROKE TEAM PROGRESS NOTE   INTERVAL HISTORY His wife is at the bedside. Patient states symptoms are better, his blurry vision is better but balance is still off. Informed patient that he should not be driving until told to do so. All questions were answered and patient and family verbalized understanding  Vitals:   02/19/21 1130 02/19/21 1145 02/19/21 1200 02/19/21 1215  BP: (!) 128/109 125/67 (!) 141/77 139/73  Pulse: 74 78 72 70  Resp: 16 15 17  (!) 21  Temp:      TempSrc:      SpO2: 100% 97% 99% 96%   CBC:  Recent Labs  Lab 02/18/21 2039 02/18/21 2141  WBC 6.9  --   NEUTROABS 4.1  --   HGB 12.9* 13.3  HCT 39.0 39.0  MCV 91.8  --   PLT 278  --    Basic Metabolic Panel:  Recent Labs  Lab 02/18/21 2039 02/18/21 2141  NA 135 135  K 4.2 4.0  CL 98 96*  CO2 27  --   GLUCOSE 125* 122*  BUN 10 12  CREATININE 1.05 1.10  CALCIUM 9.8  --    Lipid Panel: No results for input(s): CHOL, TRIG, HDL, CHOLHDL, VLDL, LDLCALC in the last 168 hours. HgbA1c:  Recent Labs  Lab 02/19/21 0616  HGBA1C 6.7*   Urine Drug Screen: No results for input(s): LABOPIA, COCAINSCRNUR, LABBENZ, AMPHETMU, THCU, LABBARB in the last 168 hours.  Alcohol Level  Recent Labs  Lab 02/18/21 2039  ETH <10    IMAGING past 24 hours CT ANGIO HEAD NECK W WO CM  Result Date: 02/19/2021 CLINICAL DATA:  80 year old male with neurologic deficit and punctate left occipital lobe infarct on MRI yesterday. EXAM: CT ANGIOGRAPHY HEAD AND NECK TECHNIQUE: Multidetector CT imaging of the head and neck was performed using the standard protocol during bolus administration of intravenous contrast. Multiplanar CT image reconstructions and MIPs were obtained to evaluate the vascular anatomy. Carotid stenosis measurements (when applicable) are obtained utilizing NASCET criteria, using the distal internal carotid diameter as the denominator. CONTRAST:  47mL OMNIPAQUE IOHEXOL 350 MG/ML SOLN COMPARISON:  Brain MRI and head CT  yesterday. FINDINGS: CT HEAD Brain: Stable CT appearance of chronic small vessel disease since yesterday. Punctate left occipital pole infarct remains occult by CT. No midline shift, mass effect, or evidence of intracranial mass lesion. No acute intracranial hemorrhage identified. Calvarium and skull base: No acute osseous abnormality identified. Paranasal sinuses: Visualized paranasal sinuses and mastoids are stable and well aerated. Orbits: No acute orbit or scalp soft tissue finding. CTA NECK Skeleton: Cervical spine degeneration although relatively mild for age. No acute osseous abnormality identified. Upper chest: Negative. Other neck: Trace retained secretions in the hypopharynx. Otherwise negative Aortic arch: 3 vessel arch configuration. Mild arch atherosclerosis. Right carotid system: Negative brachiocephalic artery and proximal right CCA. Minor calcified plaque proximal to the bifurcation without stenosis. Mild calcified plaque at the medial right ICA origin and bulb without stenosis. Left carotid system: Negative left CCA. Circumferential calcified plaque at the left carotid bifurcation and ICA origin but no stenosis. Additional moderate calcified plaque at the anterior left ICA bulb, but less than 50 % stenosis with respect to the distal vessel. Vertebral arteries: Mild calcified plaque of the proximal right subclavian artery without stenosis. Similar mild calcified plaque at the right vertebral artery origin without stenosis. Tortuous right V1 segment. Dominant right vertebral artery otherwise normal to the skull base. Mild atherosclerosis in the proximal left subclavian without stenosis.  Normal left vertebral artery origin. Non dominant left vertebral artery is patent and normal to the skull base. CTA HEAD Posterior circulation: Dominant right vertebral artery with mild calcified plaque and no stenosis to the vertebrobasilar junction. Diminutive distal left vertebral artery without stenosis remains  patent to the basilar. Normal AICA origins. Patent basilar artery without stenosis. Normal SCA and PCA origins. Tortuous left posterior communicating artery, the right is diminutive or absent. Bilateral PCA branches are within normal limits. Anterior circulation: Both ICA siphons are patent. The left siphon appears dominant, mildly ectatic with mild calcified plaque but no stenosis. Normal left ophthalmic and posterior communicating artery origins. Right ICA siphon is patent with a greater degree of calcified plaque in the cavernous segment but no significant stenosis. Patent carotid termini. Dominant left and diminutive right ACA A1 segments. Normal MCA and ACA origins. Ectatic anterior communicating artery without discrete aneurysm. Bilateral ACA branches are within normal limits. Left MCA M1 segment and bifurcation are patent without stenosis. Right MCA M1 segment and bifurcation are patent without stenosis. Bilateral MCA branches are within normal limits. Venous sinuses: Patent. Anatomic variants: Dominant right vertebral artery. Dominant left and diminutive right ACA A1 segments. Review of the MIP images confirms the above findings IMPRESSION: 1. Negative for large vessel occlusion. 2. Generally mild for age atherosclerosis in the head and neck. Calcified plaque most pronounced at the left ICA bulb, but no significant arterial stenosis. 3. Stable CT appearance of the brain since yesterday. Punctate left occipital infarct remains occult by CT. Electronically Signed   By: Genevie Ann M.D.   On: 02/19/2021 10:13   CT HEAD WO CONTRAST  Result Date: 02/18/2021 CLINICAL DATA:  Acute neuro deficit, stroke suspected EXAM: CT HEAD WITHOUT CONTRAST TECHNIQUE: Contiguous axial images were obtained from the base of the skull through the vertex without intravenous contrast. COMPARISON:  Head CT 11/22/2020, MRI 11/24/2020 FINDINGS: Brain: There is moderately advanced atrophy, with atrophic ventriculomegaly and moderate to  severe small vessel disease the cerebral white matter. Chronic lacunar infarcts are again noted in both gangliocapsular areas, left thalamus. The cerebellum and brainstem are unremarkable. There is no midline shift. No focal asymmetry is seen worrisome for acute infarct, hemorrhage or mass. Small chronic right occipito-temporal cortical infarct is again noted on axial 14 . Vascular: There calcifications in the carotid siphons, distal right vertebral artery. No hyperdense central vessel is seen. Skull: Normal. Negative for fracture or focal lesion. Sinuses/Orbits: The orbits are intact, with old lens extractions again noted. There is mild membrane disease in the paranasal sinuses without fluid levels. There is a 1 cm retention cyst or polyp in the right sphenoid air cell. There is no mastoid or middle ear effusion. Other: None. IMPRESSION: Atrophy with advanced small vessel disease and multiple lacunar infarctions with chronic appearance. No new focal asymmetry is seen concerning for cortical based infarct or bleed. Advanced small vessel disease limits assessment for isolated white matter ischemia on CT however, and follow-up with MRI is recommended if there is concern for occult infarct. Electronically Signed   By: Telford Nab M.D.   On: 02/18/2021 21:09   MR BRAIN WO CONTRAST  Result Date: 02/18/2021 CLINICAL DATA:  Acute neurologic deficit EXAM: MRI HEAD WITHOUT CONTRAST TECHNIQUE: Multiplanar, multiecho pulse sequences of the brain and surrounding structures were obtained without intravenous contrast. COMPARISON:  11/23/2020 FINDINGS: Brain: There is a punctate focus of abnormal diffusion restriction in the left occipital lobe (series 5, image 70). No other diffusion abnormality. No  acute or chronic hemorrhage. Hyperintense T2-weighted signal is moderately widespread throughout the white matter. Generalized volume loss without a clear lobar predilection. The midline structures are normal. Vascular: Major  flow voids are preserved. Skull and upper cervical spine: Normal calvarium and skull base. Visualized upper cervical spine and soft tissues are normal. Sinuses/Orbits:No paranasal sinus fluid levels or advanced mucosal thickening. No mastoid or middle ear effusion. Normal orbits. IMPRESSION: 1. Punctate focus of acute ischemia in the left occipital lobe. No hemorrhage or mass effect. 2. Moderate chronic small vessel ischemic disease and volume loss. Cerebral Atrophy (ICD10-G31.9). Electronically Signed   By: Ulyses Jarred M.D.   On: 02/18/2021 22:16    PHYSICAL EXAM  Temp:  [97.7 F (36.5 C)-98.5 F (36.9 C)] 98.5 F (36.9 C) (11/05 0125) Pulse Rate:  [68-91] 70 (11/05 1215) Resp:  [15-21] 21 (11/05 1215) BP: (125-165)/(67-109) 139/73 (11/05 1215) SpO2:  [95 %-100 %] 96 % (11/05 1215)  General - Well nourished, well developed, in no apparent distress.  Ophthalmologic - fundi not visualized due to noncooperation.  Cardiovascular - Regular rhythm and rate.  Mental Status -  Level of arousal and orientation to time, place, and person were intact. Moderate Dysarthria noted. Language including expression, naming, repetition, comprehension was assessed and found intact. Attention span and concentration were normal. Recent and remote memory were intact. Fund of Knowledge was assessed and was intact.  Cranial Nerves II - XII - II - Visual field intact OU. III, IV, VI - Extraocular movements intact. V - Facial sensation intact bilaterally. VII - Facial movement intact bilaterally. VIII - Hearing & vestibular intact bilaterally. X - Palate elevates symmetrically. XI - Chin turning & shoulder shrug intact bilaterally. XII - Tongue protrusion intact.  Motor Strength - The patient's strength was normal in all extremities and pronator drift was absent.  Bulk was normal and fasciculations were absent.   Motor Tone - Muscle tone was assessed at the neck and appendages and was normal.  Sensory  - Light touch, temperature/pinprick were assessed and were symmetrical.    Coordination - ataxia with right hand FTN.mild right LE ataxia.  Tremor was absent.  Gait and Station - deferred.   ASSESSMENT/PLAN [name] is a 79 y.o. male with history of HTN, DMT2, CVA, HLD who presents for evaluation of acute onset of left hand and left face numbness. ` He reports he was sitting in his chair around 715 tonight watching a Western on TV when he developed sudden onset of left hand numbness and then developed left-sided facial and jaw numbness with some numbness of his tongue.  He reports that 30 minutes prior to this he did have some blurry vision in his left eye which resolved in a few minutes.  He states that on Tuesday, approximately 4 days ago, he had an episode of blurry vision while he was blowing leaves that lasted for 5 or 10 minutes and then resolved but he did not seek treatment for it  Stroke Acute Left punctate occipital lobe infarct embolic secondary to small vessel disease source CT Head  Atrophy with advanced small vessel disease and multiple lacunar infarctions with chronic appearance. No new focal asymmetry is seen concerning for cortical based infarct or bleed. Advanced small vessel disease limits assessment for isolated white matter ischemia CTA head & neck 1. Negative for large vessel occlusion. 2. Generally mild for age atherosclerosis in the head and neck. Calcified plaque most pronounced at the left ICA bulb, but no  significant arterial stenosis. 3. Stable CT appearance of the brain since yesterday. Punctate left occipital infarct remains occult by CT MRI  Punctate focus of acute ischemia in the left occipital lobe. No hemorrhage or mass effect. 2. Moderate chronic small vessel ischemic disease and volume loss 2D Echo EF 65-70%. No interatrial shunt (11/2020) LDL 47 HgbA1c 6.7 VTE prophylaxis - Lovenox/ Scd's    Diet   Diet Carb Modified Fluid consistency: Thin;  Room service appropriate? Yes   clopidogrel 75 mg daily prior to admission, now on aspirin 81 mg daily and clopidogrel 75 mg daily.  Therapy recommendations:  pending Disposition:  pending  Hypertension Home meds:  norvasc 5mg , losartan 100mg  Stable Permissive hypertension (OK if < 220/120) but gradually normalize in 5-7 days Long-term BP goal normotensive  Hyperlipidemia Home meds:  lipitor 40mg , resumed in hospital (increased to 80mg )  LDL 47, goal < 70 Continue statin at discharge  Diabetes type II Controlled Home meds:  metformin and Januvia HgbA1c 6.7, goal < 7.0 CBGs Recent Labs    02/19/21 0752 02/19/21 1157  GLUCAP 160* 191*    SSI  Other Stroke Risk Factors Advanced Age >/= 78  Former Cigarette smoker Hx stroke/TIA   Hospital day # 0  Beulah Gandy, NP   ATTENDING ATTESTATION:   Discussed and formulated plan with the APP.  Not billed for same day service.   Lundon Verdejo,MD   To contact Stroke Continuity provider, please refer to [email or web address] After hours, contact General Neurology

## 2021-02-19 NOTE — ED Notes (Signed)
Pt alert, NAD, calm, interactive, resps e/u, denies questions or needs, sx or complaints.

## 2021-02-19 NOTE — H&P (Signed)
History and Physical    Vincent Alvarez:[id number] DOB: 1940/07/19 DOA: 02/18/2021  PCP: Lawerance Cruel, MD   Patient coming from: Home  Chief Complaint: Numbness in left hand and left side of face  HPI: Vincent Alvarez is a 80 y.o. male with medical history significant for HTN, DMT2, CVA, HLD who presents for evaluation of acute onset of left hand and left face numbness. ` He reports he was sitting in his chair around 715 tonight watching a Western on TV when he developed sudden onset of left hand numbness and then developed left-sided facial and jaw numbness with some numbness of his tongue.  He reports that 30 minutes prior to this he did have some blurry vision in his left eye which resolved in a few minutes.  He states that on Tuesday, approximately 4 days ago, he had an episode of blurry vision while he was blowing leaves that lasted for 5 or 10 minutes and then resolved but he did not seek treatment for it.  He reports the numbness has improved except for the tip of his tongue.  He also states he developed a mild headache after the numbness started.  He did take a Tylenol for the headache he reports.  He states he did not have any drooping face or difficulty speaking.  He states he was able to move his arms and legs and able to ambulate without problem.  He does have a history of previous stroke and is currently on Plavix and has no residual deficits from that stroke. He reports he is compliant with all of his medications Lives with his wife.  Denies tobacco use.  States he drinks a glass of wine with dinner most nights of the week.  Drug use  ED Course:  Mr. Kemppainen has been hemodynamically stable in the emergency room.  MRI of the brain is positive for an acute CVA.  CMP and CBC are unremarkable.  Neurology was consulted and seen patient in the emergency room.  Hospitalist service been asked to evaluate for further management  Review of Systems:  General: Denies fever, chills,  weight loss, night sweats.  Denies dizziness.  Denies change in appetite HENT: Denies head trauma, headache, denies change in hearing, tinnitus.  Denies nasal congestion or bleeding.  Denies sore throat,.  Denies difficulty swallowing Eyes: Denies blurry vision, pain in eye, drainage.  Denies discoloration of eyes. Neck: Denies pain.  Denies swelling.  Denies pain with movement. Cardiovascular: Denies chest pain, palpitations. Denies edema.  Denies orthopnea Respiratory: Denies shortness of breath, cough. Denies wheezing.  Denies sputum production Gastrointestinal: Denies abdominal pain, swelling.  Denies nausea, vomiting, diarrhea.  Denies melena.  Denies hematemesis. Musculoskeletal: Denies limitation of movement.  Denies deformity or swelling.  Denies pain.  Denies arthralgias or myalgias. Genitourinary: Denies pelvic pain.  Denies urinary frequency or hesitancy.  Denies dysuria.  Skin: Denies rash.  Denies petechiae, purpura, ecchymosis. Neurological: Denies syncope.  Denies seizure activity.  Denies slurred speech, drooping face.  Denies visual change. Psychiatric: Denies depression, anxiety.  Denies hallucinations.  Past Medical History:  Diagnosis Date   Atypical chest pain    Depression    Diabetes mellitus without complication ([place])    Diabetic retinopathy ([place])    Gout    Hearing loss    Hyperlipidemia    Hypertension    Onychomycosis    UTI (lower urinary tract infection)     Past Surgical History:  Procedure Laterality Date   CATARACT  EXTRACTION, BILATERAL     CHOLECYSTECTOMY     COLONOSCOPY     TONSILLECTOMY      Social History  reports that he quit smoking about 35 years ago. He has never used smokeless tobacco. He reports current alcohol use of about 2.0 standard drinks per week. He reports that he does not use drugs.  Allergies  Allergen Reactions   Shrimp [Shellfish Allergy] Nausea And Vomiting    Family History  Problem Relation Age of Onset   CAD Mother  20   Colon cancer Father 20     Prior to Admission medications   Medication Sig Start Date End Date Taking? Authorizing Provider  acetaminophen (TYLENOL) 325 MG tablet Take 650 mg by mouth every 6 (six) hours as needed for moderate pain or headache.   Yes [provider]  amLODipine (NORVASC) 5 MG tablet Take 5 mg by mouth every morning. 12/05/15  Yes [provider]  Artificial Tear Ointment (DRY EYES OP) Place 1 drop into both eyes daily as needed (dry eyes).   Yes [provider]  atorvastatin (LIPITOR) 20 MG tablet Take 20 mg by mouth every morning.    Yes [provider]  clopidogrel (PLAVIX) 75 MG tablet Take 1 tablet (75 mg total) by mouth daily. Continue taking this medication 11/25/20  Yes Adhikari, Tamsen Meek, MD  FLUoxetine (PROZAC) 20 MG capsule Take 20 mg by mouth every morning.    Yes [provider]  fluticasone (FLONASE) 50 MCG/ACT nasal spray Place 1 spray into both nostrils daily as needed for allergies.  09/14/15  Yes [provider]  losartan (COZAAR) 100 MG tablet Take 100 mg by mouth daily. 10/30/20  Yes [provider]  metFORMIN (GLUCOPHAGE) 500 MG tablet Take 1,000 mg by mouth 2 (two) times daily with a meal.    Yes [provider]  Multiple Vitamins-Minerals (PRESERVISION AREDS 2 PO) Take 1 tablet by mouth in the morning and at bedtime.   Yes [provider]  sitaGLIPtin (JANUVIA) 100 MG tablet Take 100 mg by mouth daily.   Yes [provider]  tamsulosin (FLOMAX) 0.4 MG CAPS capsule Take 0.4 mg by mouth daily after supper.    Yes [provider]  traZODone (DESYREL) 50 MG tablet Take 50 mg by mouth at bedtime.   Yes [provider]  vitamin B-12 (CYANOCOBALAMIN) 250 MCG tablet Take 250 mcg by mouth daily.   Yes [provider]  B Complex Vitamins (B COMPLEX PO) Take 1 tablet by mouth daily.    [provider]  COVID-19 mRNA bivalent vaccine, Pfizer,  (PFIZER COVID-19 VAC BIVALENT) injection Inject into the muscle. 02/14/21   Carlyle Basques, MD  COVID-19 mRNA Vac-TriS, Pfizer, (PFIZER-BIONT COVID-19 VAC-TRIS) SUSP injection Inject into the muscle. 09/21/20   Carlyle Basques, MD    Physical Exam: Vitals:   02/18/21 2254 02/19/21 0125 02/19/21 0300 02/19/21 0330  BP: (!) 149/76 (!) 165/80 (!) 147/79 (!) 142/73  Pulse: 79 71 74 68  Resp: 18 16 17 17   Temp:  98.5 F (36.9 C)    TempSrc:  Oral    SpO2: 98% 100% 98% 99%    Constitutional: NAD, calm, comfortable Vitals:   02/18/21 2254 02/19/21 0125 02/19/21 0300 02/19/21 0330  BP: (!) 149/76 (!) 165/80 (!) 147/79 (!) 142/73  Pulse: 79 71 74 68  Resp: 18 16 17 17   Temp:  98.5 F (36.9 C)    TempSrc:  Oral    SpO2: 98% 100%  98% 99%   General: WDWN, Alert and oriented x3.  Eyes: EOMI, PERRL, conjunctivae normal.  Sclera nonicteric HENT:  [place]/AT, external ears normal.  Nares patent without epistasis.  Mucous membranes are moist. Posterior pharynx clea  Neck: Soft, normal range of motion, supple, no masses, Trachea midline Respiratory: clear to auscultation bilaterally, no wheezing, no crackles. Normal respiratory effort. No accessory muscle use.  Cardiovascular: Regular rate and rhythm, no murmurs / rubs / gallops. No extremity edema. 2+ pedal pulses. Abdomen: Soft, no tenderness, nondistended, no rebound or guarding.  No masses palpated. Bowel sounds normoactive Musculoskeletal: FROM. no cyanosis. No joint deformity upper and lower extremities. Normal muscle tone.  Skin: Warm, dry, intact no rashes, lesions, ulcers. No induration Neurologic: CN 2-12 grossly intact.  Normal speech.  Sensation intact to touch, patella DTR +1 bilaterally. Strength 5/5 in all extremities. No tremor. No pronator drift.  Psychiatric: Normal judgment and insight.  Normal mood.    Labs on Admission: I have personally reviewed following labs and imaging studies  CBC: Recent Labs  Lab 02/18/21 2039  02/18/21 2141  WBC 6.9  --   NEUTROABS 4.1  --   HGB 12.9* 13.3  HCT 39.0 39.0  MCV 91.8  --   PLT 278  --     Basic Metabolic Panel: Recent Labs  Lab 02/18/21 2039 02/18/21 2141  NA 135 135  K 4.2 4.0  CL 98 96*  CO2 27  --   GLUCOSE 125* 122*  BUN 10 12  CREATININE 1.05 1.10  CALCIUM 9.8  --     GFR: CrCl cannot be calculated (Unknown ideal weight.).  Liver Function Tests: Recent Labs  Lab 02/18/21 2039  AST 16  ALT 15  ALKPHOS 68  BILITOT 0.6  PROT 6.8  ALBUMIN 3.8    Urine analysis:    Component Value Date/Time   COLORURINE YELLOW 02/18/2021 2031   APPEARANCEUR HAZY (A) 02/18/2021 2031   LABSPEC 1.006 02/18/2021 2031   PHURINE 5.0 02/18/2021 2031   GLUCOSEU NEGATIVE 02/18/2021 2031   HGBUR NEGATIVE 02/18/2021 2031   BILIRUBINUR NEGATIVE 02/18/2021 2031   KETONESUR 5 (A) 02/18/2021 2031   PROTEINUR NEGATIVE 02/18/2021 2031   NITRITE NEGATIVE 02/18/2021 2031   LEUKOCYTESUR NEGATIVE 02/18/2021 2031    Radiological Exams on Admission: CT HEAD WO CONTRAST  Result Date: 02/18/2021 CLINICAL DATA:  Acute neuro deficit, stroke suspected EXAM: CT HEAD WITHOUT CONTRAST TECHNIQUE: Contiguous axial images were obtained from the base of the skull through the vertex without intravenous contrast. COMPARISON:  Head CT 11/22/2020, MRI 11/24/2020 FINDINGS: Brain: There is moderately advanced atrophy, with atrophic ventriculomegaly and moderate to severe small vessel disease the cerebral white matter. Chronic lacunar infarcts are again noted in both gangliocapsular areas, left thalamus. The cerebellum and brainstem are unremarkable. There is no midline shift. No focal asymmetry is seen worrisome for acute infarct, hemorrhage or mass. Small chronic right occipito-temporal cortical infarct is again noted on axial 14 . Vascular: There calcifications in the carotid siphons, distal right vertebral artery. No hyperdense central vessel is seen. Skull: Normal. Negative for fracture  or focal lesion. Sinuses/Orbits: The orbits are intact, with old lens extractions again noted. There is mild membrane disease in the paranasal sinuses without fluid levels. There is a 1 cm retention cyst or polyp in the right sphenoid air cell. There is no mastoid or middle ear effusion. Other: None. IMPRESSION: Atrophy with advanced small vessel disease and multiple lacunar infarctions with chronic appearance. No new focal  asymmetry is seen concerning for cortical based infarct or bleed. Advanced small vessel disease limits assessment for isolated white matter ischemia on CT however, and follow-up with MRI is recommended if there is concern for occult infarct. Electronically Signed   By: Telford Nab M.D.   On: 02/18/2021 21:09   MR BRAIN WO CONTRAST  Result Date: 02/18/2021 CLINICAL DATA:  Acute neurologic deficit EXAM: MRI HEAD WITHOUT CONTRAST TECHNIQUE: Multiplanar, multiecho pulse sequences of the brain and surrounding structures were obtained without intravenous contrast. COMPARISON:  11/23/2020 FINDINGS: Brain: There is a punctate focus of abnormal diffusion restriction in the left occipital lobe (series 5, image 70). No other diffusion abnormality. No acute or chronic hemorrhage. Hyperintense T2-weighted signal is moderately widespread throughout the white matter. Generalized volume loss without a clear lobar predilection. The midline structures are normal. Vascular: Major flow voids are preserved. Skull and upper cervical spine: Normal calvarium and skull base. Visualized upper cervical spine and soft tissues are normal. Sinuses/Orbits:No paranasal sinus fluid levels or advanced mucosal thickening. No mastoid or middle ear effusion. Normal orbits. IMPRESSION: 1. Punctate focus of acute ischemia in the left occipital lobe. No hemorrhage or mass effect. 2. Moderate chronic small vessel ischemic disease and volume loss. Cerebral Atrophy (ICD10-G31.9). Electronically Signed   By: Ulyses Jarred M.D.   On:  02/18/2021 22:16    EKG: Independently reviewed.  EKG shows normal sinus rhythm with right bundle branch block and left anterior fascicular block.  No acute ST elevation or depression.  QTc 469  Assessment/Plan Principal Problem:   CVA (cerebral vascular accident)  Mr. Preble will be observed on medical telemetry floor for CVA.   Will evaluate carotids and intracranial vessels for large vessel occlusion/stenosis. Pt had echocardiogram in August 2022 which showed EF 65-70% with mild LVH and mild diastolic dysfunction.  ER physician reported that the neurologist mentioned patient may need a TEE.  Will await neurology's official note and recommendations to see if this will need to be ordered Hypertension of 220/110 will be allowed for 24 hours per stroke protocol.  After which blood pressure will be slowly reduced to goal level. Antiplatelet therapy with aspirin and plavix daily. Continue statin therapy but increase lipitor to 80 mg a day. Had lipid panel in August 2022 so will not repeat at this time.  Neurochecks per stroke protocol  Active Problems:   Essential hypertension Resume home medications of norvasc and Cozaar tomorrow am after 24 hour permissive hypertension period ends.    Diabetes mellitus, type 2  Continue Farxiga. Metformin held for 48 hours with CTA head and neck ordered.  Monitor BS and provide insulin as needed for glycemic control.    Hyperlipidemia Continue lipitor and increase dose to 80 mg a day   DVT prophylaxis: SCDs for DVT prophylaxis.   Code Status:   Full Code  Family Communication:  Diagnosis plan discussed with patient.  He verbalized understanding agrees with plan.  Further recommendations as clinical indicated Disposition Plan:   Patient is from:  Home  Anticipated DC to:  Home  Anticipated DC date:  Anticipate less than 2 midnight stay  Consults called:  Neurology, Dr. Rodney Booze  Admission status:  Observation  Yevonne Aline Nixon Sparr MD [facility]  How to contact the [facility] Attending or Consulting provider [place] or covering provider during after hours [place], for this patient?   Check the care team in [facility] and look for a) attending/consulting [facility] provider listed and b) the [facility]  team listed Log into [email or web address] and use [facility]'s universal password to access. If you do not have the password, please contact the hospital operator. Locate the [facility] provider you are looking for under [facility] and page to a number that you can be directly reached. If you still have difficulty reaching the provider, please page the [facility] (Director on Call) for the Hospitalists listed on amion for assistance.  02/19/2021, 4:16 AM

## 2021-02-19 NOTE — ED Notes (Signed)
Admitting providers at bedside

## 2021-02-20 DIAGNOSIS — I639 Cerebral infarction, unspecified: Secondary | ICD-10-CM | POA: Diagnosis not present

## 2021-02-20 DIAGNOSIS — E785 Hyperlipidemia, unspecified: Secondary | ICD-10-CM | POA: Diagnosis not present

## 2021-02-20 DIAGNOSIS — I10 Essential (primary) hypertension: Secondary | ICD-10-CM | POA: Diagnosis not present

## 2021-02-20 DIAGNOSIS — E11319 Type 2 diabetes mellitus with unspecified diabetic retinopathy without macular edema: Secondary | ICD-10-CM | POA: Diagnosis not present

## 2021-02-20 LAB — GLUCOSE, CAPILLARY
Glucose-Capillary: 181 mg/dL — ABNORMAL HIGH (ref 70–99)
Glucose-Capillary: 146 mg/dL — ABNORMAL HIGH (ref 70–99)

## 2021-02-20 MED ORDER — ASPIRIN 81 MG PO TBEC: 81.0000 mg | DELAYED_RELEASE_TABLET | Freq: Every day | ORAL | 0 refills | Status: DC

## 2021-02-20 MED ORDER — ATORVASTATIN CALCIUM 80 MG PO TABS: 80.0000 mg | ORAL_TABLET | ORAL | 0 refills | Status: AC

## 2021-02-20 NOTE — Progress Notes (Signed)
SLP Cancellation Note  Patient Details Name: Vincent Alvarez MRN: [id number] DOB: 01/23/1941   Cancelled treatment:       Reason Eval/Treat Not Completed: SLP screened, no needs identified, will sign off. SLP performed medical chart review and no significant cognitive-linguistic or speech deficits identified. Symptoms were primarily related to vision and balance secondary to occipital lobe CVA. Patient may seek out OP SLP evaluation if any future concerns arise. Thank you for this consult!   Sonia Baller, MA, CCC-SLP Speech Therapy

## 2021-02-20 NOTE — Care Management Obs Status (Signed)
[place] NOTIFICATION   Patient Details  Name: Vincent Alvarez MRN: [id number] Date of Birth: [birth year]   Medicare Observation Status Notification Given:  Yes    Carles Collet, RN 02/20/2021, 2:36 PM

## 2021-02-20 NOTE — Progress Notes (Signed)
STROKE TEAM PROGRESS NOTE   INTERVAL HISTORY His wife is at the bedside. Patient endures improvement in symptoms. He was able to walk to the bathroom unassisted this am. No new neurological symptoms   Vitals:   02/20/21 0400 02/20/21 0719 02/20/21 0947 02/20/21 1214  BP: (!) 143/80 (!) 148/81 126/78 131/77  Pulse: 66 70  71  Resp:  18  18  Temp:    97.7 F (36.5 C)  TempSrc:    Oral  SpO2: 97% 96%  96%  Weight:      Height:       CBC:  Recent Labs  Lab 02/18/21 2039 02/18/21 2141  WBC 6.9  --   NEUTROABS 4.1  --   HGB 12.9* 13.3  HCT 39.0 39.0  MCV 91.8  --   PLT 278  --     Basic Metabolic Panel:  Recent Labs  Lab 02/18/21 2039 02/18/21 2141  NA 135 135  K 4.2 4.0  CL 98 96*  CO2 27  --   GLUCOSE 125* 122*  BUN 10 12  CREATININE 1.05 1.10  CALCIUM 9.8  --     Lipid Panel: No results for input(s): CHOL, TRIG, HDL, CHOLHDL, VLDL, LDLCALC in the last 168 hours. HgbA1c:  Recent Labs  Lab 02/19/21 0616  HGBA1C 6.7*    Urine Drug Screen: No results for input(s): LABOPIA, COCAINSCRNUR, LABBENZ, AMPHETMU, THCU, LABBARB in the last 168 hours.  Alcohol Level  Recent Labs  Lab 02/18/21 2039  ETH <10     IMAGING past 24 hours No results found.  PHYSICAL EXAM  Temp:  [97.7 F (36.5 C)-98.3 F (36.8 C)] 97.7 F (36.5 C) (11/06 1214) Pulse Rate:  [64-73] 71 (11/06 1214) Resp:  [16-18] 18 (11/06 1214) BP: (126-148)/(69-87) 131/77 (11/06 1214) SpO2:  [94 %-97 %] 96 % (11/06 1214) Weight:  [98.6 kg] 98.6 kg (11/05 1803)  General - Well nourished, well developed, in no apparent distress.  Ophthalmologic - fundi not visualized due to noncooperation.  Cardiovascular - Regular rhythm and rate.  Mental Status -  Level of arousal and orientation to time, place, and person were intact. Moderate Dysarthria noted. Language including expression, naming, repetition, comprehension was assessed and found intact. Attention span and concentration were  normal. Recent and remote memory were intact. Fund of Knowledge was assessed and was intact.  Cranial Nerves II - XII - II - Visual field intact OU. III, IV, VI - Extraocular movements intact. V - Facial sensation intact bilaterally. VII - Facial movement intact bilaterally. VIII - Hearing & vestibular intact bilaterally. X - Palate elevates symmetrically. XI - Chin turning & shoulder shrug intact bilaterally. XII - Tongue protrusion intact.  Motor Strength - The patient's strength was normal in all extremities and pronator drift was absent.  Bulk was normal and fasciculations were absent.   Motor Tone - Muscle tone was assessed at the neck and appendages and was normal.  Sensory - Light touch, temperature/pinprick were assessed and were symmetrical.    Coordination - ataxia with right hand FTN.mild right LE ataxia.  Tremor was absent.  Gait and Station - deferred.   ASSESSMENT/PLAN Mr. BECKER CHRISTOPHER is a 80 y.o. male with history of HTN, DMT2, CVA, HLD who presents for evaluation of acute onset of left hand and left face numbness. ` He reports he was sitting in his chair around 715 tonight watching a Western on TV when he developed sudden onset of left hand numbness and then developed  left-sided facial and jaw numbness with some numbness of his tongue.  He reports that 30 minutes prior to this he did have some blurry vision in his left eye which resolved in a few minutes.  He states that on Tuesday, approximately 4 days ago, he had an episode of blurry vision while he was blowing leaves that lasted for 5 or 10 minutes and then resolved but he did not seek treatment for it  Stroke Acute Left punctate occipital lobe infarct embolic secondary to small vessel disease source CT Head  Atrophy with advanced small vessel disease and multiple lacunar infarctions with chronic appearance. No new focal asymmetry is seen concerning for cortical based infarct or bleed. Advanced small vessel  disease limits assessment for isolated white matter ischemia CTA head & neck 1. Negative for large vessel occlusion. 2. Generally mild for age atherosclerosis in the head and neck. Calcified plaque most pronounced at the left ICA bulb, but no significant arterial stenosis. 3. Stable CT appearance of the brain since yesterday. Punctate left occipital infarct remains occult by CT MRI  Punctate focus of acute ischemia in the left occipital lobe. No hemorrhage or mass effect. 2. Moderate chronic small vessel ischemic disease and volume loss 2D Echo EF 65-70%. No interatrial shunt (11/2020) LDL 47 HgbA1c 6.7 VTE prophylaxis - Lovenox/ Scd's    Diet   Diet Carb Modified Fluid consistency: Thin; Room service appropriate? Yes   clopidogrel 75 mg daily prior to admission, now on aspirin 81 mg daily and clopidogrel 75 mg daily for 90 days then ASA alone  Follow up with neurology in 4 weeks post discharge from hospital  Therapy recommendations:  None Disposition:  pending  Hypertension Home meds:  norvasc 5mg , losartan 100mg  Stable Permissive hypertension (OK if < 220/120) but gradually normalize in 5-7 days Long-term BP goal normotensive  Hyperlipidemia Home meds:  lipitor 40mg , resumed in hospital (increased to 80mg )  LDL 47, goal < 70 Continue statin at discharge  Diabetes type II Controlled Home meds:  metformin and Januvia HgbA1c 6.7, goal < 7.0 CBGs Recent Labs    02/19/21 2007 02/20/21 0719 02/20/21 1214  GLUCAP 180* 181* 146*     SSI  Other Stroke Risk Factors Advanced Age >/= 52  Former Cigarette smoker Hx stroke/TIA   Hospital day # 1  Neurology will sign off, please call with questions  Beulah Gandy, NP   ATTENDING ATTESTATION:  Dr. Reeves Forth evaluated pt independently, reviewed imaging, chart, labs. Discussed and formulated plan with the APP. Please see APP note above for details.   Total 30 minutes spent on counseling patient and coordinating care, writing  notes and reviewing chart.  Zaleigh Bermingham,MD    To contact Stroke Continuity provider, please refer to [email or web address] After hours, contact General Neurology

## 2021-02-20 NOTE — Progress Notes (Signed)
Occupational Therapy Evaluation Patient Details Name: Vincent Alvarez MRN: [id number] DOB: 04-Nov-1940 Today's Date: 02/20/2021   History of Present Illness Pt is a 80 y.o. M who presents 02/18/2021 with acute onset of left hand and face numbness and blurry vision in the left eye which resolved in a few minutes. Significant PMH: HTN, DM2, CVA, HLD.   Clinical Impression   Pt presents with above diagnosis. PTA pt PLOF living at home with wife in 1 level house with 5 step entrance and B railing. Pt reports being I to mod I with ADLs and performing yard work with increased time and AE as needed. Pt is currently limited with safe ADL engagement due to concerns of visual deficits, impaired strength, safety awareness, and decreased functional mobility. Pt will benefit to continued skilled level OT in acute for education of AE, compensatory strategies, safety awareness, and functional transfer to maximize independence prior to DC. Pt is eager to learn more about RW when performing morning routine due to concerns of instability when exiting bed. DC recommendation to home with PRN assistance with no further OT needed.        Recommendations for follow up therapy are one component of a multi-disciplinary discharge planning process, led by the attending physician.  Recommendations may be updated based on patient status, additional functional criteria and insurance authorization.   Follow Up Recommendations  No OT follow up    Assistance Recommended at Discharge PRN  Functional Status Assessment     Equipment Recommendations  Other (comment) (RW)    Recommendations for Other Services       Precautions / Restrictions Precautions Precautions: Fall Restrictions Weight Bearing Restrictions: No      Mobility Bed Mobility               General bed mobility comments: pt received up in chair upon arrival.    Transfers Overall transfer level: Modified independent Equipment used: None                       Balance Overall balance assessment: Mild deficits observed, not formally tested                                   Dynamic Gait Index Level Surface: Mild Impairment Change in Gait Speed: Mild Impairment Gait with Horizontal Head Turns: Normal Gait with Vertical Head Turns: Normal Gait and Pivot Turn: Normal Step Over Obstacle: Mild Impairment Step Around Obstacles: Normal Steps: Mild Impairment Total Score: 20     ADL either performed or assessed with clinical judgement   ADL Overall ADL's : Needs assistance/impaired Eating/Feeding: Independent;Sitting   Grooming: Wash/dry hands;Oral care;Supervision/safety;Standing             Upper Body Dressing Details (indicate cue type and reason): Not formally assessed, will infer mod I in sitting Lower Body Dressing: Modified independent Lower Body Dressing Details (indicate cue type and reason): Pt observed removing socks and donning shoes for toilet/functional transfer Toilet Transfer: Min guard;Cueing for safety Toilet Transfer Details (indicate cue type and reason): Pt educated on grab bar to assist to power up to stand. Toileting- Clothing Manipulation and Hygiene: Modified independent Toileting - Clothing Manipulation Details (indicate cue type and reason): Pt demonstrated pericare with sitting/leaning of toilet.     Functional mobility during ADLs: Min guard General ADL Comments: Pt educated on use of RW for functional transfer in home setting  with explanation and demonstration as well as education for vision with awareness and lighting in home setting.     Vision Baseline Vision/History: 1 Wears glasses (readers, reports prior surgery for cataracts.) Patient Visual Report: Peripheral vision impairment Vision Assessment?: Vision impaired- to be further tested in functional context Additional Comments: Pt received with possible L visual field deficit per PT. Assessed visual fields and  peripheral vision. Pt reports limitations with identifying stim on L visual field but reports improvement from admission.     Perception     Praxis      Pertinent Vitals/Pain Pain Assessment: 0-10 Pain Score: 3  Pain Location: headache Pain Intervention(s): Monitored during session     Hand Dominance Right   Extremity/Trunk Assessment Upper Extremity Assessment Upper Extremity Assessment: Overall WFL for tasks assessed   Lower Extremity Assessment Lower Extremity Assessment: Defer to PT evaluation   Cervical / Trunk Assessment Cervical / Trunk Assessment: Normal   Communication Communication Communication: HOH;Other (comment) (slight word finding difficulties (noted this from prior admission as well))   Cognition Arousal/Alertness: Awake/alert Behavior During Therapy: WFL for tasks assessed/performed Overall Cognitive Status: Within Functional Limits for tasks assessed                                       General Comments  VSS on RA    Exercises     Shoulder Instructions      Home Living Family/patient expects to be discharged to:: Private residence Living Arrangements: Spouse/significant other Available Help at Discharge: Family;Available 24 hours/day Type of Home: House Home Access: Stairs to enter [employer] of Steps: 5 Entrance Stairs-Rails:  (reports both sides.) Home Layout: One level     Bathroom Shower/Tub: Occupational psychologist: Standard     Home Equipment: Grab bars - tub/shower;Cane - single point;Shower seat (reports suction grab bars)          Prior Functioning/Environment Prior Level of Function : Independent/Modified Independent             Mobility Comments: doing yard work          OT Problem List: Impaired balance (sitting and/or standing);Impaired vision/perception;Decreased safety awareness;Decreased knowledge of use of DME or AE;Decreased knowledge of precautions;Pain      OT  Treatment/Interventions: Self-care/ADL training;Patient/family education;Balance training;Visual/perceptual remediation/compensation    OT Goals(Current goals can be found in the care plan section) Acute Rehab OT Goals Patient Stated Goal: no goal stated OT Goal Formulation: With patient Time For Goal Achievement: 03/06/21 Potential to Achieve Goals: Good  OT Frequency: Min 1X/week   Barriers to D/C:            Co-evaluation              AM-PAC OT "6 Clicks" Daily Activity     Outcome Measure Help from another person eating meals?: None Help from another person taking care of personal grooming?: None Help from another person toileting, which includes using toliet, bedpan, or urinal?: None Help from another person bathing (including washing, rinsing, drying)?: None Help from another person to put on and taking off regular upper body clothing?: None Help from another person to put on and taking off regular lower body clothing?: None 6 Click Score: 24   End of Session Equipment Utilized During Treatment: Gait belt;Rolling walker (2 wheels) Nurse Communication: Mobility status  Activity Tolerance: Patient tolerated treatment well Patient left:  in chair;with chair alarm set;with call bell/phone within reach  OT Visit Diagnosis: Unsteadiness on feet (R26.81);Pain;Other abnormalities of gait and mobility (R26.89)                Time: 0063-4949 OT Time Calculation (min): 27 min Charges:  OT General Charges $OT Visit: 1 Visit OT Evaluation $OT Eval Low Complexity: 1 Low OT Treatments $Self Care/Home Management : 8-22 mins  Vincent Alvarez, Vincent Alvarez, Vincent Alvarez  [facility]  [phone]   Vincent Alvarez 02/20/2021, 11:58 AM

## 2021-02-20 NOTE — Discharge Summary (Signed)
Physician Discharge Summary  Vincent Alvarez WIO:[id number] DOB: 12-Nov-1940 DOA: 02/18/2021  PCP: Lawerance Cruel, MD  Admit date: 02/18/2021 Discharge date: 02/20/2021  Admitted From: Home Disposition:  Home  Recommendations for Outpatient Follow-up and new medication changes:  Follow up with Dr. Harrington Challenger in 7 to 10 days. Added aspirin to clopidogrel for dual antiplatelet therapy for 90 days. Increased atorvastatin to 80 mg daily.  Follow up with Dr Leonie Man from neurology.   Home Health: no   Equipment/Devices: no    Discharge Condition: stable  CODE STATUS: full  Diet recommendation:  heart healthy and diabetic prudent.   Brief/Interim Summary: Vincent Alvarez was admitted to the hospital with the working diagnosis of acute CVA, left occipital lobe    80 year old male past medical history for hypertension, type II diabetes mellitus, CVA and dyslipidemia who presented with numbness in the left hand and left side of the face.  He was sitting watching TV when suddenly developed left hand numbness and left-sided facial jaw numbness including his tongue.  About 4 days prior he had an episode of blurry vision that self resolved.  Because of the symptoms he presented to the hospital for further evaluation.  On his initial physical examination his blood pressure was 149/76, heart rate 79, respiratory rate 18, oxygen saturation 98%, his lungs were clear to auscultation bilaterally, heart S1-S2, present, rhythmic, soft abdomen, lower extremity edema.   Sodium 135, potassium 4.2, chloride 98, bicarb 27, glucose 125, BUN 10, creatinine 1.0, white count 6.9, hemoglobin 12.9, hematocrit 39.0, platelets 278. SARS COVID-19 negative.   Urinalysis [place] gravity 1.006.   Head CT with advanced small vessel disease and multiple lacunar infarctions of chronic appearance.  No new focal asymmetry seen. Head and neck CT angiography with negative large vessel occlusion.   Brain MRI with punctate focus of  acute ischemia in the left occipital lobe.  No hemorrhage or mass-effect.   EKG 86 bpm left axis deviation, left anterior fascicular block, right bundle branch block, sinus rhythm with poor R wave progression, no significant ST segment T wave changes.  Acute left occipital lobe CVA.  Patient was admitted to the medical ward, he had frequent neurochecks, PT, OT and neurology consultation. He was placed on medical therapy including aspirin, clopidogrel and high-dose high potency statin with atorvastatin 80 mg daily. Continue with dual antiplatelet therapy for 90 days.  He had a recent echocardiography showing no shunt physiology.  His symptoms improved, at discharge he is back to his baseline. Plan to follow-up as an outpatient with neurology.  2.  Hypertension.  Continue blood pressure control with amlodipine and losartan.  3.  Type 2 diabetes mellitus/ dyslpidemia.  Intermittent well-controlled during his hospitalization. At discharge continue linagliptin and metformin.  Lipid panel with cholesterol 109, HLD 49, LDL 47, triglycerides 67. Continue with atorvastatin, dose increased due to CVA.   4.  BPH.  No signs of urine retention, continue tamsulosin.    Discharge Diagnoses:  Principal Problem:   CVA (cerebral vascular accident) [facility]) Active Problems:   Diabetes mellitus, type 2 ([place])   Essential hypertension   Hyperlipidemia    Discharge Instructions   Allergies as of 02/20/2021       Reactions   Shrimp [shellfish Allergy] Nausea And Vomiting        Medication List     STOP taking these medications    Pfizer COVID-19 Vac Bivalent injection Generic drug: COVID-19 mRNA bivalent vaccine (Pfizer)   Pfizer-BioNT COVID-19 Vac-TriS Susp injection  Generic drug: COVID-19 mRNA Vac-TriS Therapist, music)       TAKE these medications    acetaminophen 325 MG tablet Commonly known as: TYLENOL Take 650 mg by mouth every 6 (six) hours as needed for moderate pain or headache.    amLODipine 5 MG tablet Commonly known as: NORVASC Take 5 mg by mouth every morning.   aspirin 81 MG EC tablet Take 1 tablet (81 mg total) by mouth daily. Swallow whole. Start taking on: February 21, 2021   atorvastatin 80 MG tablet Commonly known as: LIPITOR Take 1 tablet (80 mg total) by mouth every morning. Start taking on: February 21, 2021 What changed:  medication strength how much to take   B COMPLEX PO Take 1 tablet by mouth daily.   clopidogrel 75 MG tablet Commonly known as: PLAVIX Take 1 tablet (75 mg total) by mouth daily. Continue taking this medication   DRY EYES OP Place 1 drop into both eyes daily as needed (dry eyes).   FLUoxetine 20 MG capsule Commonly known as: PROZAC Take 20 mg by mouth every morning.   fluticasone 50 MCG/ACT nasal spray Commonly known as: FLONASE Place 1 spray into both nostrils daily as needed for allergies.   losartan 100 MG tablet Commonly known as: COZAAR Take 100 mg by mouth daily.   metFORMIN 500 MG tablet Commonly known as: GLUCOPHAGE Take 1,000 mg by mouth 2 (two) times daily with a meal.   PRESERVISION AREDS 2 PO Take 1 tablet by mouth in the morning and at bedtime.   sitaGLIPtin 100 MG tablet Commonly known as: JANUVIA Take 100 mg by mouth daily.   tamsulosin 0.4 MG Caps capsule Commonly known as: FLOMAX Take 0.4 mg by mouth daily after supper.   traZODone 50 MG tablet Commonly known as: DESYREL Take 50 mg by mouth at bedtime.   vitamin B-12 250 MCG tablet Commonly known as: CYANOCOBALAMIN Take 250 mcg by mouth daily.        Allergies  Allergen Reactions   Shrimp [Shellfish Allergy] Nausea And Vomiting    Consultations: Neurology    Procedures/Studies: CT ANGIO HEAD NECK W WO CM  Result Date: 02/19/2021 CLINICAL DATA:  80 year old male with neurologic deficit and punctate left occipital lobe infarct on MRI yesterday. EXAM: CT ANGIOGRAPHY HEAD AND NECK TECHNIQUE: Multidetector CT imaging of the  head and neck was performed using the standard protocol during bolus administration of intravenous contrast. Multiplanar CT image reconstructions and MIPs were obtained to evaluate the vascular anatomy. Carotid stenosis measurements (when applicable) are obtained utilizing NASCET criteria, using the distal internal carotid diameter as the denominator. CONTRAST:  2mL OMNIPAQUE IOHEXOL 350 MG/ML SOLN COMPARISON:  Brain MRI and head CT yesterday. FINDINGS: CT HEAD Brain: Stable CT appearance of chronic small vessel disease since yesterday. Punctate left occipital pole infarct remains occult by CT. No midline shift, mass effect, or evidence of intracranial mass lesion. No acute intracranial hemorrhage identified. Calvarium and skull base: No acute osseous abnormality identified. Paranasal sinuses: Visualized paranasal sinuses and mastoids are stable and well aerated. Orbits: No acute orbit or scalp soft tissue finding. CTA NECK Skeleton: Cervical spine degeneration although relatively mild for age. No acute osseous abnormality identified. Upper chest: Negative. Other neck: Trace retained secretions in the hypopharynx. Otherwise negative Aortic arch: 3 vessel arch configuration. Mild arch atherosclerosis. Right carotid system: Negative brachiocephalic artery and proximal right CCA. Minor calcified plaque proximal to the bifurcation without stenosis. Mild calcified plaque at the medial right ICA origin and  bulb without stenosis. Left carotid system: Negative left CCA. Circumferential calcified plaque at the left carotid bifurcation and ICA origin but no stenosis. Additional moderate calcified plaque at the anterior left ICA bulb, but less than 50 % stenosis with respect to the distal vessel. Vertebral arteries: Mild calcified plaque of the proximal right subclavian artery without stenosis. Similar mild calcified plaque at the right vertebral artery origin without stenosis. Tortuous right V1 segment. Dominant right  vertebral artery otherwise normal to the skull base. Mild atherosclerosis in the proximal left subclavian without stenosis. Normal left vertebral artery origin. Non dominant left vertebral artery is patent and normal to the skull base. CTA HEAD Posterior circulation: Dominant right vertebral artery with mild calcified plaque and no stenosis to the vertebrobasilar junction. Diminutive distal left vertebral artery without stenosis remains patent to the basilar. Normal AICA origins. Patent basilar artery without stenosis. Normal SCA and PCA origins. Tortuous left posterior communicating artery, the right is diminutive or absent. Bilateral PCA branches are within normal limits. Anterior circulation: Both ICA siphons are patent. The left siphon appears dominant, mildly ectatic with mild calcified plaque but no stenosis. Normal left ophthalmic and posterior communicating artery origins. Right ICA siphon is patent with a greater degree of calcified plaque in the cavernous segment but no significant stenosis. Patent carotid termini. Dominant left and diminutive right ACA A1 segments. Normal MCA and ACA origins. Ectatic anterior communicating artery without discrete aneurysm. Bilateral ACA branches are within normal limits. Left MCA M1 segment and bifurcation are patent without stenosis. Right MCA M1 segment and bifurcation are patent without stenosis. Bilateral MCA branches are within normal limits. Venous sinuses: Patent. Anatomic variants: Dominant right vertebral artery. Dominant left and diminutive right ACA A1 segments. Review of the MIP images confirms the above findings IMPRESSION: 1. Negative for large vessel occlusion. 2. Generally mild for age atherosclerosis in the head and neck. Calcified plaque most pronounced at the left ICA bulb, but no significant arterial stenosis. 3. Stable CT appearance of the brain since yesterday. Punctate left occipital infarct remains occult by CT. Electronically Signed   By: Genevie Ann  M.D.   On: 02/19/2021 10:13   CT HEAD WO CONTRAST  Result Date: 02/18/2021 CLINICAL DATA:  Acute neuro deficit, stroke suspected EXAM: CT HEAD WITHOUT CONTRAST TECHNIQUE: Contiguous axial images were obtained from the base of the skull through the vertex without intravenous contrast. COMPARISON:  Head CT 11/22/2020, MRI 11/24/2020 FINDINGS: Brain: There is moderately advanced atrophy, with atrophic ventriculomegaly and moderate to severe small vessel disease the cerebral white matter. Chronic lacunar infarcts are again noted in both gangliocapsular areas, left thalamus. The cerebellum and brainstem are unremarkable. There is no midline shift. No focal asymmetry is seen worrisome for acute infarct, hemorrhage or mass. Small chronic right occipito-temporal cortical infarct is again noted on axial 14 . Vascular: There calcifications in the carotid siphons, distal right vertebral artery. No hyperdense central vessel is seen. Skull: Normal. Negative for fracture or focal lesion. Sinuses/Orbits: The orbits are intact, with old lens extractions again noted. There is mild membrane disease in the paranasal sinuses without fluid levels. There is a 1 cm retention cyst or polyp in the right sphenoid air cell. There is no mastoid or middle ear effusion. Other: None. IMPRESSION: Atrophy with advanced small vessel disease and multiple lacunar infarctions with chronic appearance. No new focal asymmetry is seen concerning for cortical based infarct or bleed. Advanced small vessel disease limits assessment for isolated white matter ischemia on CT  however, and follow-up with MRI is recommended if there is concern for occult infarct. Electronically Signed   By: Telford Nab M.D.   On: 02/18/2021 21:09   MR BRAIN WO CONTRAST  Result Date: 02/18/2021 CLINICAL DATA:  Acute neurologic deficit EXAM: MRI HEAD WITHOUT CONTRAST TECHNIQUE: Multiplanar, multiecho pulse sequences of the brain and surrounding structures were obtained  without intravenous contrast. COMPARISON:  11/23/2020 FINDINGS: Brain: There is a punctate focus of abnormal diffusion restriction in the left occipital lobe (series 5, image 70). No other diffusion abnormality. No acute or chronic hemorrhage. Hyperintense T2-weighted signal is moderately widespread throughout the white matter. Generalized volume loss without a clear lobar predilection. The midline structures are normal. Vascular: Major flow voids are preserved. Skull and upper cervical spine: Normal calvarium and skull base. Visualized upper cervical spine and soft tissues are normal. Sinuses/Orbits:No paranasal sinus fluid levels or advanced mucosal thickening. No mastoid or middle ear effusion. Normal orbits. IMPRESSION: 1. Punctate focus of acute ischemia in the left occipital lobe. No hemorrhage or mass effect. 2. Moderate chronic small vessel ischemic disease and volume loss. Cerebral Atrophy (ICD10-G31.9). Electronically Signed   By: Ulyses Jarred M.D.   On: 02/18/2021 22:16      Subjective: Patient with resolution of paresthesias, improved ambulation and balance, no headache, no nausea or vomiting.   Discharge Exam: Vitals:   02/20/21 0947 02/20/21 1214  BP: 126/78 131/77  Pulse:  71  Resp:  18  Temp:  97.7 F (36.5 C)  SpO2:  96%   Vitals:   02/20/21 0400 02/20/21 0719 02/20/21 0947 02/20/21 1214  BP: (!) 143/80 (!) 148/81 126/78 131/77  Pulse: 66 70  71  Resp:  18  18  Temp:    97.7 F (36.5 C)  TempSrc:    Oral  SpO2: 97% 96%  96%  Weight:      Height:        General: Not in pain or dyspnea  Neurology: Awake and alert, non focal  E ENT: no pallor, no icterus, oral mucosa moist Cardiovascular: No JVD. S1-S2 present, rhythmic, no gallops, rubs, or murmurs. No lower extremity edema. Pulmonary: vesicular breath sounds bilaterally, adequate air movement, no wheezing, rhonchi or rales. Gastrointestinal. Abdomen soft and non tender Skin. No rashes Musculoskeletal: no joint  deformities   The results of significant diagnostics from this hospitalization (including imaging, microbiology, ancillary and laboratory) are listed below for reference.     Microbiology: Recent Results (from the past 240 hour(s))  Resp Panel by RT-PCR (Flu A&B, Covid) Nasopharyngeal Swab     Status: None   Collection Time: 02/18/21  8:31 PM   Specimen: Nasopharyngeal Swab; Nasopharyngeal(NP) swabs in vial transport medium  Result Value Ref Range Status   SARS Coronavirus 2 by RT PCR NEGATIVE NEGATIVE Final    Comment: (NOTE) SARS-CoV-2 target nucleic acids are NOT DETECTED.  The SARS-CoV-2 RNA is generally detectable in upper respiratory specimens during the acute phase of infection. The lowest concentration of SARS-CoV-2 viral copies this assay can detect is 138 copies/mL. A negative result does not preclude SARS-Cov-2 infection and should not be used as the sole basis for treatment or other patient management decisions. A negative result may occur with  improper specimen collection/handling, submission of specimen other than nasopharyngeal swab, presence of viral mutation(s) within the areas targeted by this assay, and inadequate number of viral copies(<138 copies/mL). A negative result must be combined with clinical observations, patient history, and epidemiological information. The expected result  is Negative.  Fact Sheet for Patients:  EntrepreneurPulse.com.au  Fact Sheet for Healthcare Providers:  IncredibleEmployment.be  This test is no t yet approved or cleared by the [place] FDA and  has been authorized for detection and/or diagnosis of SARS-CoV-2 by FDA under an Emergency Use Authorization (EUA). This EUA will remain  in effect (meaning this test can be used) for the duration of the COVID-19 declaration under Section 564(b)(1) of the Act, 21 U.S.C.section 360bbb-3(b)(1), unless the authorization is terminated  or revoked  sooner.       Influenza A by PCR NEGATIVE NEGATIVE Final   Influenza B by PCR NEGATIVE NEGATIVE Final    Comment: (NOTE) The Xpert Xpress SARS-CoV-2/FLU/RSV plus assay is intended as an aid in the diagnosis of influenza from Nasopharyngeal swab specimens and should not be used as a sole basis for treatment. Nasal washings and aspirates are unacceptable for Xpert Xpress SARS-CoV-2/FLU/RSV testing.  Fact Sheet for Patients: EntrepreneurPulse.com.au  Fact Sheet for Healthcare Providers: IncredibleEmployment.be  This test is not yet approved or cleared by the [place] FDA and has been authorized for detection and/or diagnosis of SARS-CoV-2 by FDA under an Emergency Use Authorization (EUA). This EUA will remain in effect (meaning this test can be used) for the duration of the COVID-19 declaration under Section 564(b)(1) of the Act, 21 U.S.C. section 360bbb-3(b)(1), unless the authorization is terminated or revoked.  Performed at [facility], [place] [address] [place], [place] [address]      Labs: BNP (last 3 results) No results for input(s): BNP in the last 8760 hours. Basic Metabolic Panel: Recent Labs  Lab 02/18/21 2039 02/18/21 2141  NA 135 135  K 4.2 4.0  CL 98 96*  CO2 27  --   GLUCOSE 125* 122*  BUN 10 12  CREATININE 1.05 1.10  CALCIUM 9.8  --    Liver Function Tests: Recent Labs  Lab 02/18/21 2039  AST 16  ALT 15  ALKPHOS 68  BILITOT 0.6  PROT 6.8  ALBUMIN 3.8   No results for input(s): LIPASE, AMYLASE in the last 168 hours. No results for input(s): AMMONIA in the last 168 hours. CBC: Recent Labs  Lab 02/18/21 2039 02/18/21 2141  WBC 6.9  --   NEUTROABS 4.1  --   HGB 12.9* 13.3  HCT 39.0 39.0  MCV 91.8  --   PLT 278  --    Cardiac Enzymes: No results for input(s): CKTOTAL, CKMB, CKMBINDEX, TROPONINI in the last 168 hours. BNP: Invalid input(s): POCBNP CBG: Recent Labs  Lab 02/19/21 1157  02/19/21 1704 02/19/21 2007 02/20/21 0719 02/20/21 1214  GLUCAP 191* 122* 180* 181* 146*   D-Dimer No results for input(s): DDIMER in the last 72 hours. Hgb A1c Recent Labs    02/19/21 0616  HGBA1C 6.7*   Lipid Profile No results for input(s): CHOL, HDL, LDLCALC, TRIG, CHOLHDL, LDLDIRECT in the last 72 hours. Thyroid function studies No results for input(s): TSH, T4TOTAL, T3FREE, THYROIDAB in the last 72 hours.  Invalid input(s): FREET3 Anemia work up No results for input(s): VITAMINB12, FOLATE, FERRITIN, TIBC, IRON, RETICCTPCT in the last 72 hours. Urinalysis    Component Value Date/Time   COLORURINE YELLOW 02/18/2021 2031   APPEARANCEUR HAZY (A) 02/18/2021 2031   LABSPEC 1.006 02/18/2021 2031   PHURINE 5.0 02/18/2021 2031   GLUCOSEU NEGATIVE 02/18/2021 2031   HGBUR NEGATIVE 02/18/2021 2031   BILIRUBINUR NEGATIVE 02/18/2021 2031   KETONESUR 5 (A) 02/18/2021 2031   PROTEINUR  NEGATIVE 02/18/2021 2031   NITRITE NEGATIVE 02/18/2021 2031   LEUKOCYTESUR NEGATIVE 02/18/2021 2031   Sepsis Labs Invalid input(s): PROCALCITONIN,  WBC,  LACTICIDVEN Microbiology Recent Results (from the past 240 hour(s))  Resp Panel by RT-PCR (Flu A&B, Covid) Nasopharyngeal Swab     Status: None   Collection Time: 02/18/21  8:31 PM   Specimen: Nasopharyngeal Swab; Nasopharyngeal(NP) swabs in vial transport medium  Result Value Ref Range Status   SARS Coronavirus 2 by RT PCR NEGATIVE NEGATIVE Final    Comment: (NOTE) SARS-CoV-2 target nucleic acids are NOT DETECTED.  The SARS-CoV-2 RNA is generally detectable in upper respiratory specimens during the acute phase of infection. The lowest concentration of SARS-CoV-2 viral copies this assay can detect is 138 copies/mL. A negative result does not preclude SARS-Cov-2 infection and should not be used as the sole basis for treatment or other patient management decisions. A negative result may occur with  improper specimen collection/handling,  submission of specimen other than nasopharyngeal swab, presence of viral mutation(s) within the areas targeted by this assay, and inadequate number of viral copies(<138 copies/mL). A negative result must be combined with clinical observations, patient history, and epidemiological information. The expected result is Negative.  Fact Sheet for Patients:  EntrepreneurPulse.com.au  Fact Sheet for Healthcare Providers:  IncredibleEmployment.be  This test is no t yet approved or cleared by the [place] FDA and  has been authorized for detection and/or diagnosis of SARS-CoV-2 by FDA under an Emergency Use Authorization (EUA). This EUA will remain  in effect (meaning this test can be used) for the duration of the COVID-19 declaration under Section 564(b)(1) of the Act, 21 U.S.C.section 360bbb-3(b)(1), unless the authorization is terminated  or revoked sooner.       Influenza A by PCR NEGATIVE NEGATIVE Final   Influenza B by PCR NEGATIVE NEGATIVE Final    Comment: (NOTE) The Xpert Xpress SARS-CoV-2/FLU/RSV plus assay is intended as an aid in the diagnosis of influenza from Nasopharyngeal swab specimens and should not be used as a sole basis for treatment. Nasal washings and aspirates are unacceptable for Xpert Xpress SARS-CoV-2/FLU/RSV testing.  Fact Sheet for Patients: EntrepreneurPulse.com.au  Fact Sheet for Healthcare Providers: IncredibleEmployment.be  This test is not yet approved or cleared by the [place] FDA and has been authorized for detection and/or diagnosis of SARS-CoV-2 by FDA under an Emergency Use Authorization (EUA). This EUA will remain in effect (meaning this test can be used) for the duration of the COVID-19 declaration under Section 564(b)(1) of the Act, 21 U.S.C. section 360bbb-3(b)(1), unless the authorization is terminated or revoked.  Performed at [facility], [place] [address] [place], [place] [address]      Time coordinating discharge: 45 minutes  SIGNED:   Tawni Millers, MD  [facility] 02/20/2021, 1:50 PM

## 2021-02-22 DIAGNOSIS — Z961 Presence of intraocular lens: Secondary | ICD-10-CM | POA: Diagnosis not present

## 2021-02-22 DIAGNOSIS — H353131 Nonexudative age-related macular degeneration, bilateral, early dry stage: Secondary | ICD-10-CM | POA: Diagnosis not present

## 2021-02-22 DIAGNOSIS — E113293 Type 2 diabetes mellitus with mild nonproliferative diabetic retinopathy without macular edema, bilateral: Secondary | ICD-10-CM | POA: Diagnosis not present

## 2021-02-23 DIAGNOSIS — I10 Essential (primary) hypertension: Secondary | ICD-10-CM | POA: Diagnosis not present

## 2021-02-23 DIAGNOSIS — D649 Anemia, unspecified: Secondary | ICD-10-CM | POA: Diagnosis not present

## 2021-02-23 DIAGNOSIS — I679 Cerebrovascular disease, unspecified: Secondary | ICD-10-CM | POA: Diagnosis not present

## 2021-02-23 DIAGNOSIS — E785 Hyperlipidemia, unspecified: Secondary | ICD-10-CM | POA: Diagnosis not present

## 2021-02-23 DIAGNOSIS — R899 Unspecified abnormal finding in specimens from other organs, systems and tissues: Secondary | ICD-10-CM | POA: Diagnosis not present

## 2021-02-23 DIAGNOSIS — I63532 Cerebral infarction due to unspecified occlusion or stenosis of left posterior cerebral artery: Secondary | ICD-10-CM | POA: Diagnosis not present

## 2021-02-23 DIAGNOSIS — E11319 Type 2 diabetes mellitus with unspecified diabetic retinopathy without macular edema: Secondary | ICD-10-CM | POA: Diagnosis not present

## 2021-02-23 DIAGNOSIS — Z09 Encounter for follow-up examination after completed treatment for conditions other than malignant neoplasm: Secondary | ICD-10-CM | POA: Diagnosis not present

## 2021-02-28 ENCOUNTER — Encounter: Payer: Self-pay | Admitting: Neurology

## 2021-02-28 ENCOUNTER — Ambulatory Visit (INDEPENDENT_AMBULATORY_CARE_PROVIDER_SITE_OTHER): Payer: Medicare Other | Admitting: Neurology

## 2021-02-28 VITALS — BP 143/74 | HR 78 | Ht 68.0 in | Wt 221.0 lb

## 2021-02-28 DIAGNOSIS — G459 Transient cerebral ischemic attack, unspecified: Secondary | ICD-10-CM

## 2021-02-28 DIAGNOSIS — I639 Cerebral infarction, unspecified: Secondary | ICD-10-CM | POA: Diagnosis not present

## 2021-02-28 NOTE — Progress Notes (Signed)
[facility] [address] [place]. [place] [address] [phone]       OFFICE CONSULT NOTE  [name] Date of Birth:  February 22, 1941 Medical Record Number:  [id number]   Referring MD:  Dr Cathlean Sauer  Reason for Referral:  TIA  HPI: Vincent Alvarez is a 80 year old pleasant Caucasian male seen today for initial office consultation visit for TIA.  History is obtained from the patient, review of electronic medical records and opossum reviewed pertinent available imaging films in PACS.  He has past medical history of diabetes, hypertension, hyperlipidemia, hearing loss, gout and depression.  He was recently admitted to [facility] on 02/18/2021 when he developed sudden onset of left hand and left-sided face and lip numbness and tingling while watching TV at home.  Around 4 days prior to that he had an episode of blurred vision that resolved spontaneously within 1015 minutes and did not seek medical help.  His numbness also resolved within a few minutes.  MRI scan of the brain on admission showed a small left occipital infarct which likely is a represented his symptoms from 4 days prior.  CT angiogram of the brain and neck were both obtained showed no significant large vessel stenosis or occlusion.  Echocardiogram had been done on 11/24/2020 showed show normal ejection fraction without cardiac source of embolism.  LDL cholesterol 47 mg percent and hemoglobin A1c was 6.7.  Patient was started on aspirin and Plavix dual antiplatelet therapy for 3 weeks and states is done well since discharge he has had no recurrent episodes of numbness tingling, blurred vision or any other stroke or TIA symptoms.  Tolerating aspirin Plavix without bruising or bleeding.  He states blood pressure is under good control and today it is 143/74.  He is on Lipitor is tolerating well without muscle aches and pains.  He has a prior admission in August 2022 for left hemispheric TIA when he presented with  right hand and face numbness and some weakness while shopping in a grocery store.  MRI scan of the brain at that time had shown no acute infarct and small remote age right temporal occipital cortical infarct and changes of small vessel disease.  LDL cholesterol at that time was 47 mg percent.  And hemoglobin A1c 7.5.  Patient also had remote history of TIA in September 2017 for which she was last seen in the office for follow-up by Vincent Alvarez.  He was then lost to follow-up  ROS:   14 system review of systems is positive for tingling, numbness, blurred vision all other systems negative  PMH:  Past Medical History:  Diagnosis Date   Atypical chest pain    Depression    Diabetes mellitus without complication (HCC)    Diabetic retinopathy ([place])    Gout    Hearing loss    Hyperlipidemia    Hypertension    Onychomycosis    UTI (lower urinary tract infection)     Social History:  Social History   Socioeconomic History   Marital status: Married    Spouse name: Vincent Alvarez   Number of children: 0   Years of education: College   Highest education level: Not on file  Occupational History   Occupation: retired - postal service  Tobacco Use   Smoking status: Former    Types: Cigarettes    Quit date: 06/18/1985    Years since quitting: 35.7   Smokeless tobacco: Never  Substance and Sexual Activity  Alcohol use: Yes    Alcohol/week: 2.0 standard drinks    Types: 2 Glasses of wine per week    Comment: thinks he may be drinking too much, 2/4 + CAGE questions   Drug use: No   Sexual activity: Not on file  Other Topics Concern   Not on file  Social History Narrative   Lives with spouse   Caffeine use: 3 coffee/day   Social Determinants of Health   Financial Resource Strain: Not on file  Food Insecurity: Not on file  Transportation Needs: Not on file  Physical Activity: Not on file  Stress: Not on file  Social Connections: Not on file  Intimate  Partner Violence: Not on file    Medications:   Current Outpatient Medications on File Prior to Visit  Medication Sig Dispense Refill   acetaminophen (TYLENOL) 325 MG tablet Take 650 mg by mouth every 6 (six) hours as needed for moderate pain or headache.     amLODipine (NORVASC) 5 MG tablet Take 5 mg by mouth every morning.     Artificial Tear Ointment (DRY EYES OP) Place 1 drop into both eyes daily as needed (dry eyes).     aspirin EC 81 MG EC tablet Take 1 tablet (81 mg total) by mouth daily. Swallow whole. 30 tablet 0   atorvastatin (LIPITOR) 80 MG tablet Take 1 tablet (80 mg total) by mouth every morning. 30 tablet 0   B Complex Vitamins (B COMPLEX PO) Take 1 tablet by mouth daily.     clopidogrel (PLAVIX) 75 MG tablet Take 1 tablet (75 mg total) by mouth daily. Continue taking this medication 30 tablet 1   FLUoxetine (PROZAC) 20 MG capsule Take 20 mg by mouth every morning.      fluticasone (FLONASE) 50 MCG/ACT nasal spray Place 1 spray into both nostrils daily as needed for allergies.      losartan (COZAAR) 100 MG tablet Take 100 mg by mouth daily.     metFORMIN (GLUCOPHAGE) 500 MG tablet Take 1,000 mg by mouth 2 (two) times daily with a meal.      Multiple Vitamins-Minerals (PRESERVISION AREDS 2 PO) Take 1 tablet by mouth in the morning and at bedtime.     sitaGLIPtin (JANUVIA) 100 MG tablet Take 100 mg by mouth daily.     tamsulosin (FLOMAX) 0.4 MG CAPS capsule Take 0.4 mg by mouth daily after supper.      traZODone (DESYREL) 50 MG tablet Take 50 mg by mouth at bedtime.     vitamin B-12 (CYANOCOBALAMIN) 250 MCG tablet Take 250 mcg by mouth daily.     No current facility-administered medications on file prior to visit.    Allergies:   Allergies  Allergen Reactions   Shrimp [Shellfish Allergy] Nausea And Vomiting    Physical Exam General: well developed, well nourished , seated, in no evident distress Head: head normocephalic and atraumatic.   Neck: supple with soft left  carotid  bruit Cardiovascular: regular rate and rhythm, soft ejection systolic murmur  musculoskeletal: no deformity Skin:  no rash/petichiae Vascular:  Normal pulses all extremities  Neurologic Exam Mental Status: Awake and fully alert. Oriented to place and time. Recent and remote memory intact. Attention span, concentration and fund of knowledge appropriate. Mood and affect appropriate.  Cranial Nerves: Fundoscopic exam reveals sharp disc margins. Pupils equal, briskly reactive to light. Extraocular movements full without nystagmus. Visual fields full to confrontation. Hearing intact. Facial sensation intact. Face, tongue, palate moves normally and symmetrically.  Motor: Normal bulk and tone. Normal strength in all tested extremity muscles. Sensory.: intact to touch , pinprick , position and vibratory sensation.  Coordination: Rapid alternating movements normal in all extremities. Finger-to-nose and heel-to-shin performed accurately bilaterally. Gait and Station: Arises from chair without difficulty. Stance is normal. Gait demonstrates normal stride length and balance . Able to heel, toe and tandem walk with difficulty.  Reflexes: 1+ and symmetric. Toes downgoing.   NIHSS  0 Modified Rankin  1   ASSESSMENT: 80 year old Caucasian male with episode of left hand and face numbness likely due to right subcortical TIA in November 2022 from small vessel disease.  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  Remote history of TIA in September 2017 and prior right temporal cortical infarct on MRI.  Vascular risk factors of hypertension, diabetes, hyperlipidemia, cerebrovascular disease and age.     PLAN:I had a long d/w patient about his recent stroke, risk for recurrent stroke/TIAs, personally independently reviewed imaging studies and stroke evaluation results  and answered questions.Continue aspirin 81 mg daily and Plavixl 75 mg daily  for 2 more weeks and then stop aspirin and stay on Plavix alone for secondary stroke prevention and maintain strict control of hypertension with blood pressure goal below 130/90, diabetes with hemoglobin A1c goal below 6.5% and lipids with LDL cholesterol goal below 70 mg/dL. I also advised the patient to eat a healthy diet with plenty of whole grains, cereals, fruits and vegetables, exercise regularly and maintain ideal body weight .Refer to cardiology for loop recorder for PAF detection.. Patient will benefit with walker for ambulation and safety.Follow up in 3 months or call earlier if needed.  Greater than 50% time during this 45-minute visit was spent on counseling and coordination of care and discussion about stroke prevention and treatment and answering questions  Antony Contras, MD Note: This document was prepared with digital dictation and possible smart phrase technology. Any transcriptional errors that result from this process are unintentional.

## 2021-02-28 NOTE — Patient Instructions (Signed)
I had a long d/w patient about his recent stroke, risk for recurrent stroke/TIAs, personally independently reviewed imaging studies and stroke evaluation results and answered questions.Continue aspirin 81 mg daily and Plavixl 75 mg daily  for 2 more weeks and then stop aspirin and stay on Plavix alone for secondary stroke prevention and maintain strict control of hypertension with blood pressure goal below 130/90, diabetes with hemoglobin A1c goal below 6.5% and lipids with LDL cholesterol goal below 70 mg/dL. I also advised the patient to eat a healthy diet with plenty of whole grains, cereals, fruits and vegetables, exercise regularly and maintain ideal body weight .Rfer to cardiology for loop recorder for PAF detection.. Patient will benefit with walker for ambulation and safety.Follow Stroke Prevention Some medical conditions and behaviors can lead to a higher chance of having a stroke. You can help prevent a stroke by eating healthy, exercising, not smoking, and managing any medical conditions you have. Stroke is a leading cause of functional impairment. Primary prevention is particularly important because a majority of strokes are first-time events. Stroke changes the lives of not only those who experience a stroke but also their family and other caregivers. How can this condition affect me? A stroke is a medical emergency and should be treated right away. A stroke can lead to brain damage and can sometimes be life-threatening. If a person gets medical treatment right away, there is a better chance of surviving and recovering from a stroke. What can increase my risk? The following medical conditions may increase your risk of a stroke: Cardiovascular disease. High blood pressure (hypertension). Diabetes. High cholesterol. Sickle cell disease. Blood clotting disorders (hypercoagulable state). Obesity. Sleep disorders (obstructive sleep apnea). Other risk factors include: Being older than age  24. Having a history of blood clots, stroke, or mini-stroke (transient ischemic attack, TIA). Genetic factors, such as race, ethnicity, or a family history of stroke. Smoking cigarettes or using other tobacco products. Taking birth control pills, especially if you also use tobacco. Heavy use of alcohol or drugs, especially cocaine and methamphetamine. Physical inactivity. What actions can I take to prevent this? Manage your health conditions High cholesterol levels. Eating a healthy diet is important for preventing high cholesterol. If cholesterol cannot be managed through diet alone, you may need to take medicines. Take any prescribed medicines to control your cholesterol as told by your health care provider. Hypertension. To reduce your risk of stroke, try to keep your blood pressure below 130/80. Eating a healthy diet and exercising regularly are important for controlling blood pressure. If these steps are not enough to manage your blood pressure, you may need to take medicines. Take any prescribed medicines to control hypertension as told by your health care provider. Ask your health care provider if you should monitor your blood pressure at home. Have your blood pressure checked every year, even if your blood pressure is normal. Blood pressure increases with age and some medical conditions. Diabetes. Eating a healthy diet and exercising regularly are important parts of managing your blood sugar (glucose). If your blood sugar cannot be managed through diet and exercise, you may need to take medicines. Take any prescribed medicines to control your diabetes as told by your health care provider. Get evaluated for obstructive sleep apnea. Talk to your health care provider about getting a sleep evaluation if you snore a lot or have excessive sleepiness. Make sure that any other medical conditions you have, such as atrial fibrillation or atherosclerosis, are managed. Nutrition Follow  instructions  from your health care provider about what to eat or drink to help manage your health condition. These instructions may include: Reducing your daily calorie intake. Limiting how much salt (sodium) you use to 1,500 milligrams (mg) each day. Using only healthy fats for cooking, such as olive oil, canola oil, or sunflower oil. Eating healthy foods. You can do this by: Choosing foods that are high in fiber, such as whole grains, and fresh fruits and vegetables. Eating at least 5 servings of fruits and vegetables a day. Try to fill one-half of your plate with fruits and vegetables at each meal. Choosing lean protein foods, such as lean cuts of meat, poultry without skin, fish, tofu, beans, and nuts. Eating low-fat dairy products. Avoiding foods that are high in sodium. This can help lower blood pressure. Avoiding foods that have saturated fat, trans fat, and cholesterol. This can help prevent high cholesterol. Avoiding processed and prepared foods. Counting your daily carbohydrate intake.  Lifestyle If you drink alcohol: Limit how much you have to: 0-1 drink a day for women who are not pregnant. 0-2 drinks a day for men. Know how much alcohol is in your drink. In the [place] one drink equals one 12 oz bottle of beer (391m), one 5 oz glass of wine (1473m, or one 1 oz glass of hard liquor (4458m Do not use any products that contain nicotine or tobacco. These products include cigarettes, chewing tobacco, and vaping devices, such as e-cigarettes. If you need help quitting, ask your health care provider. Avoid secondhand smoke. Do not use drugs. Activity  Try to stay at a healthy weight. Get at least 30 minutes of exercise on most days, such as: Fast walking. Biking. Swimming. Medicines Take over-the-counter and prescription medicines only as told by your health care provider. Aspirin or blood thinners (antiplatelets or anticoagulants) may be recommended to reduce your risk of  forming blood clots that can lead to stroke. Avoid taking birth control pills. Talk to your health care provider about the risks of taking birth control pills if: You are over 35 31ars old. You smoke. You get very bad headaches. You have had a blood clot. Where to find more information American Stroke Association: www.strokeassociation.org Get help right away if: You or a loved one has any symptoms of a stroke. "BE FAST" is an easy way to remember the main warning signs of a stroke: B - Balance. Signs are dizziness, sudden trouble walking, or loss of balance. E - Eyes. Signs are trouble seeing or a sudden change in vision. F - Face. Signs are sudden weakness or numbness of the face, or the face or eyelid drooping on one side. A - Arms. Signs are weakness or numbness in an arm. This happens suddenly and usually on one side of the body. S - Speech. Signs are sudden trouble speaking, slurred speech, or trouble understanding what people say. T - Time. Time to call emergency services. Write down what time symptoms started. You or a loved one has other signs of a stroke, such as: A sudden, severe headache with no known cause. Nausea or vomiting. Seizure. These symptoms may represent a serious problem that is an emergency. Do not wait to see if the symptoms will go away. Get medical help right away. Call your local emergency services (911 in the [place] Do not drive yourself to the hospital. Summary You can help to prevent a stroke by eating healthy, exercising, not smoking, limiting alcohol intake, and managing any medical conditions you  may have. Do not use any products that contain nicotine or tobacco. These include cigarettes, chewing tobacco, and vaping devices, such as e-cigarettes. If you need help quitting, ask your health care provider. Remember "BE FAST" for warning signs of a stroke. Get help right away if you or a loved one has any of these signs. This information is not intended to  replace advice given to you by your health care provider. Make sure you discuss any questions you have with your health care provider. Document Revised: 11/03/2019 Document Reviewed: 11/03/2019 Elsevier Patient Education  [place]. up in the future with me in 3 months or call earlier if needed.

## 2021-03-16 ENCOUNTER — Telehealth: Payer: Self-pay | Admitting: Neurology

## 2021-03-16 NOTE — Telephone Encounter (Signed)
Pt called cant remember the name of the place Dr. Leonie Man referred him to for the heart monitor. Pt requesting a call back.

## 2021-03-17 NOTE — Telephone Encounter (Signed)
I called and spoke with [facility]'s referral coordinator, April (referral states this needs to be done at [place]). April states that the order was put in as a monitor but the comment says loop recorder. Since these are 2 different procedures, we need to verify which one MD is requesting.

## 2021-03-21 NOTE — Telephone Encounter (Signed)
Please set up loop recorder implantation. Needs OV with me ASAP or with CC to discuss and proceed.    Adrian Prows, MD, [facility] 03/21/2021, 4:10 PM Office: [phone] Fax: [phone] Pager: [phone]

## 2021-03-21 NOTE — Telephone Encounter (Signed)
I will set this up for loop implantation. Will do ASAP

## 2021-03-23 ENCOUNTER — Encounter: Payer: Self-pay | Admitting: Cardiology

## 2021-03-23 ENCOUNTER — Ambulatory Visit: Payer: Medicare Other | Admitting: Cardiology

## 2021-03-23 ENCOUNTER — Other Ambulatory Visit: Payer: Self-pay

## 2021-03-23 VITALS — BP 134/71 | HR 75 | Temp 97.2°F | Resp 17 | Ht 68.0 in | Wt 217.0 lb

## 2021-03-23 DIAGNOSIS — I10 Essential (primary) hypertension: Secondary | ICD-10-CM

## 2021-03-23 DIAGNOSIS — E7800 Pure hypercholesterolemia, unspecified: Secondary | ICD-10-CM

## 2021-03-23 DIAGNOSIS — I639 Cerebral infarction, unspecified: Secondary | ICD-10-CM

## 2021-03-23 NOTE — Progress Notes (Signed)
Primary Physician/Referring:  Lawerance Cruel, MD  Patient ID: Vincent Alvarez, male    DOB: 07-Jun-1940, 80 y.o.   MRN: [id number]  Chief Complaint  Patient presents with   consult for loop implantation   Hypertension   HPI:    Vincent Alvarez  is a 80 y.o. male with history of diabetes, hypertension, hyperlipidemia, hearing loss, gout and depression. Patient with history of recurrent TIAs and cryptogenic stroke, first in 2017 and most recently November 2022.  Patient follows with Dr. Leonie Man and neurology and is presently on Plavix.  Patient's recent work-up 02/2021 revealed no significant extra cerebral vascular disease, stroke was felt to be cryptogenic/embolic.  She is now referred by Dr. Bunnie Pion for consideration of loop recorder implantation.  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  Denies chest pain, palpitations, syncope, near syncope.  Denies orthopnea, PND, leg edema.  Patient denies symptoms of claudication.  Patient is accompanied by his wife at today's office visit.  Past Medical History:  Diagnosis Date   Atypical chest pain    Depression    Diabetes mellitus without complication (HCC)    Diabetic retinopathy ([place])    Gout    Hearing loss    Hyperlipidemia    Hypertension    Onychomycosis    UTI (lower urinary tract infection)    Past Surgical History:  Procedure Laterality Date   CATARACT EXTRACTION, BILATERAL     CHOLECYSTECTOMY     COLONOSCOPY     TONSILLECTOMY     Family History  Problem Relation Age of Onset   CAD Mother 75   Colon cancer Father 61   Stroke Brother 24       4 strokes   Lung cancer Brother     Social History   Tobacco Use   Smoking status: Former    Types: Cigarettes    Quit date: 06/18/1985    Years since quitting: 35.7   Smokeless tobacco: Never  Substance Use Topics   Alcohol use: Yes    Alcohol/week: 2.0 standard drinks    Types: 2 Glasses of wine per week     Comment: thinks he may be drinking too much, 2/4 + CAGE questions   Marital Status: Married   ROS  Review of Systems  Constitutional: Negative for malaise/fatigue and weight gain.  Cardiovascular:  Negative for chest pain, claudication, leg swelling, near-syncope, orthopnea, palpitations, paroxysmal nocturnal dyspnea and syncope.  Respiratory:  Negative for shortness of breath.   Neurological:  Negative for dizziness.   Objective  Blood pressure 134/71, pulse 75, temperature (!) 97.2 F (36.2 C), temperature source Temporal, resp. rate 17, height 5\' 8"  (1.727 m), weight 217 lb (98.4 kg), SpO2 99 %.  Vitals with BMI 03/23/2021 02/28/2021 02/20/2021  Height 5\' 8"  5\' 8"  -  Weight 217 lbs 221 lbs -  BMI 33 09.73 -  Systolic 532 992 426  Diastolic 71 74 79  Pulse 75 78 74      Physical Exam Vitals reviewed.  Neck:     Vascular: No carotid bruit or JVD.  Cardiovascular:     Rate and Rhythm: Normal rate and regular rhythm.     Pulses: Intact distal pulses.     Heart sounds: S1 normal and S2 normal. Murmur heard.  Systolic murmur is present. Soft systolic ejection murmur    No gallop.  Pulmonary:     Effort: Pulmonary effort is normal. No respiratory distress.  Breath sounds: No wheezing, rhonchi or rales.  Musculoskeletal:     Right lower leg: Edema (trace ankle) present.     Left lower leg: Edema (trace ankle) present.  Neurological:     Mental Status: He is alert.    Laboratory examination:   Recent Labs    11/23/20 1521 11/24/20 0800 02/18/21 2039 02/18/21 2141  NA 134* 137 135 135  K 3.8 4.0 4.2 4.0  CL 97* 103 98 96*  CO2 27 28 27   --   GLUCOSE 254* 165* 125* 122*  BUN 19 16 10 12   CREATININE 1.42* 1.05 1.05 1.10  CALCIUM 9.3 9.2 9.8  --   GFRNONAA 50* >60 >60  --    CrCl cannot be calculated (Patient's most recent lab result is older than the maximum 21 days allowed.).  CMP Latest Ref Rng & Units 02/18/2021 02/18/2021 11/24/2020  Glucose 70 - 99 mg/dL  122(H) 125(H) 165(H)  BUN 8 - 23 mg/dL 12 10 16   Creatinine 0.61 - 1.24 mg/dL 1.10 1.05 1.05  Sodium 135 - 145 mmol/L 135 135 137  Potassium 3.5 - 5.1 mmol/L 4.0 4.2 4.0  Chloride 98 - 111 mmol/L 96(L) 98 103  CO2 22 - 32 mmol/L - 27 28  Calcium 8.9 - 10.3 mg/dL - 9.8 9.2  Total Protein 6.5 - 8.1 g/dL - 6.8 -  Total Bilirubin 0.3 - 1.2 mg/dL - 0.6 -  Alkaline Phos 38 - 126 U/L - 68 -  AST 15 - 41 U/L - 16 -  ALT 0 - 44 U/L - 15 -   CBC Latest Ref Rng & Units 02/18/2021 02/18/2021 11/23/2020  WBC 4.0 - 10.5 K/uL - 6.9 6.5  Hemoglobin 13.0 - 17.0 g/dL 13.3 12.9(L) 12.8(L)  Hematocrit 39.0 - 52.0 % 39.0 39.0 39.5  Platelets 150 - 400 K/uL - 278 257   Lipid Panel Recent Labs    11/24/20 0335  CHOL 109  TRIG 67  LDLCALC 47  VLDL 13  HDL 49  CHOLHDL 2.2   HEMOGLOBIN A1C Lab Results  Component Value Date   HGBA1C 6.7 (H) 02/19/2021   MPG 145.59 02/19/2021   TSH No results for input(s): TSH in the last 8760 hours.  External labs:   None   Allergies   Allergies  Allergen Reactions   Shrimp [Shellfish Allergy] Nausea And Vomiting    Medications Prior to Visit:   Outpatient Medications Prior to Visit  Medication Sig Dispense Refill   acetaminophen (TYLENOL) 325 MG tablet Take 650 mg by mouth every 6 (six) hours as needed for moderate pain or headache.     amLODipine (NORVASC) 5 MG tablet Take 5 mg by mouth every morning.     Artificial Tear Ointment (DRY EYES OP) Place 1 drop into both eyes daily as needed (dry eyes).     atorvastatin (LIPITOR) 80 MG tablet Take 1 tablet (80 mg total) by mouth every morning. 30 tablet 0   B Complex Vitamins (B COMPLEX PO) Take 1 tablet by mouth daily.     clopidogrel (PLAVIX) 75 MG tablet Take 1 tablet (75 mg total) by mouth daily. Continue taking this medication 30 tablet 1   FLUoxetine (PROZAC) 20 MG capsule Take 20 mg by mouth every morning.      fluticasone (FLONASE) 50 MCG/ACT nasal spray Place 1 spray into both nostrils daily as  needed for allergies.      losartan (COZAAR) 100 MG tablet Take 100 mg by mouth daily.  metFORMIN (GLUCOPHAGE) 500 MG tablet Take 1,000 mg by mouth 2 (two) times daily with a meal.      Multiple Vitamins-Minerals (PRESERVISION AREDS 2+MULTI VIT) CAPS Take 1 capsule by mouth in the morning and at bedtime.     sitaGLIPtin (JANUVIA) 100 MG tablet Take 100 mg by mouth daily.     tamsulosin (FLOMAX) 0.4 MG CAPS capsule Take 0.4 mg by mouth daily after supper.      traZODone (DESYREL) 50 MG tablet Take 50 mg by mouth at bedtime.     aspirin EC 81 MG EC tablet Take 1 tablet (81 mg total) by mouth daily. Swallow whole. 30 tablet 0   Multiple Vitamins-Minerals (PRESERVISION AREDS 2 PO) Take 1 tablet by mouth in the morning and at bedtime.     vitamin B-12 (CYANOCOBALAMIN) 250 MCG tablet Take 250 mcg by mouth daily.     No facility-administered medications prior to visit.   Final Medications at End of Visit    Current Meds  Medication Sig   acetaminophen (TYLENOL) 325 MG tablet Take 650 mg by mouth every 6 (six) hours as needed for moderate pain or headache.   amLODipine (NORVASC) 5 MG tablet Take 5 mg by mouth every morning.   Artificial Tear Ointment (DRY EYES OP) Place 1 drop into both eyes daily as needed (dry eyes).   atorvastatin (LIPITOR) 80 MG tablet Take 1 tablet (80 mg total) by mouth every morning.   B Complex Vitamins (B COMPLEX PO) Take 1 tablet by mouth daily.   clopidogrel (PLAVIX) 75 MG tablet Take 1 tablet (75 mg total) by mouth daily. Continue taking this medication   FLUoxetine (PROZAC) 20 MG capsule Take 20 mg by mouth every morning.    fluticasone (FLONASE) 50 MCG/ACT nasal spray Place 1 spray into both nostrils daily as needed for allergies.    losartan (COZAAR) 100 MG tablet Take 100 mg by mouth daily.   metFORMIN (GLUCOPHAGE) 500 MG tablet Take 1,000 mg by mouth 2 (two) times daily with a meal.    Multiple Vitamins-Minerals (PRESERVISION AREDS 2+MULTI VIT) CAPS Take 1  capsule by mouth in the morning and at bedtime.   sitaGLIPtin (JANUVIA) 100 MG tablet Take 100 mg by mouth daily.   tamsulosin (FLOMAX) 0.4 MG CAPS capsule Take 0.4 mg by mouth daily after supper.    traZODone (DESYREL) 50 MG tablet Take 50 mg by mouth at bedtime.   Radiology:   No results found. CTA head and neck 02/19/2021: 1. Negative for large vessel occlusion. 2. Generally mild for age atherosclerosis in the head and neck. Calcified plaque most pronounced at the left ICA bulb, but no significant arterial stenosis. 3. Stable CT appearance of the brain since yesterday. Punctate left occipital infarct remains occult by CT.  Brain MRI 02/18/2021: 1. Punctate focus of acute ischemia in the left occipital lobe. No hemorrhage or mass effect. 2. Moderate chronic small vessel ischemic disease and volume loss.  Cardiac Studies:   Echocardiogram 11/24/2020: 1. Left ventricular ejection fraction, by estimation, is 65 to 70%. The  left ventricle has normal function. The left ventricle has no regional wall motion abnormalities. There is mild left ventricular hypertrophy.  Left ventricular diastolic parameters are consistent with Grade I diastolic dysfunction (impaired relaxation). Elevated left ventricular end-diastolic pressure. The E/e' is 58.   2. Right ventricular systolic function is normal. The right ventricular  size is normal. Tricuspid regurgitation signal is inadequate for assessing PA pressure.   3. The mitral valve  is abnormal. Trivial mitral valve regurgitation.   4. The aortic valve is tricuspid. Aortic valve regurgitation is not visualized. Mild to moderate aortic valve sclerosis/calcification is present, without any evidence of aortic stenosis.   5. The inferior vena cava is normal in size with greater than 50%  respiratory variability, suggesting right atrial pressure of 3 mmHg.  EKG:   03/23/2021: Normal sinus rhythm at rate of 68 bpm, left axis deviation, left anterior fascicle  block.  Right bundle branch block.  Bifascicular block.  T wave abnormality, cannot exclude inferior ischemia.  Abnormal EKG. No significant change from 11/22/2020.  Assessment     ICD-10-CM   1. Cryptogenic stroke (HCC)  I63.9     2. Primary hypertension  I10 EKG 12-Lead    3. Hypercholesterolemia  E78.00        Medications Discontinued During This Encounter  Medication Reason   Multiple Vitamins-Minerals (PRESERVISION AREDS 2 PO)    vitamin B-12 (CYANOCOBALAMIN) 250 MCG tablet    aspirin EC 81 MG EC tablet     No orders of the defined types were placed in this encounter.   Recommendations:   Vincent Alvarez is a 80 y.o. male with history of diabetes, hypertension, hyperlipidemia, hearing loss, gout and depression. Patient with history of recurrent TIAs and cryptogenic stroke, first in 2017 and most recently November 2022.  Patient follows with Dr. Leonie Man and neurology and is presently on Plavix.  Patient's recent work-up 02/2021 revealed no significant extra cerebral vascular disease, stroke was felt to be cryptogenic/embolic.  She is now referred by Dr. Bunnie Pion for consideration of loop recorder implantation.  I personally reviewed external records including CTA, brain MRI, echocardiogram and discussed results with patient and his wife who is present at bedside.  Patient's blood pressure is well controlled.  Patient's lipids are under excellent control.  He is asymptomatic from a cardiovascular standpoint.  EKG notes normal sinus rhythm.  EKG does note T wave abnormality, cannot exclude inferior ischemia, however patient is without angina or anginal equivalent, and therefore given advanced age do not recommend further ischemic evaluation at this time.  Counseled patient regarding signs and symptoms of which she should notify our office, he verbalized understanding agreement.  Regard to patient's history of cryptogenic stroke CTA showed no extracerebral vascular disease, therefore  concern is the etiology may be related to underlying cardiac arrhythmia.  Recommend loop recorder implantation to further evaluate for underlying arrhythmia.  Discussed at length with patient and his wife risks versus benefits and alternatives to loop implantation, patient verbalized understanding and wishes to proceed with loop recorder implant.  Will schedule patient for loop recorder implantation given history of cryptogenic stroke.   Patient was seen in collaboration with Dr. Einar Gip and he is in agreement with the plan.   During this visit I reviewed and updated: Tobacco history  allergies medication reconciliation  medical history  surgical history  family history  social history.  This note was created using a voice recognition software as a result there may be grammatical errors inadvertently enclosed that do not reflect the nature of this encounter. Every attempt is made to correct such errors.   Alethia Berthold, PA-C 03/23/2021, 3:17 PM Office: [phone]

## 2021-03-30 ENCOUNTER — Ambulatory Visit: Payer: Medicare Other | Admitting: Cardiology

## 2021-03-30 ENCOUNTER — Encounter: Payer: Self-pay | Admitting: Cardiology

## 2021-03-30 ENCOUNTER — Other Ambulatory Visit: Payer: Self-pay

## 2021-03-30 VITALS — BP 97/60 | HR 76 | Temp 98.0°F | Resp 16 | Ht 68.0 in | Wt 217.0 lb

## 2021-03-30 DIAGNOSIS — I639 Cerebral infarction, unspecified: Secondary | ICD-10-CM

## 2021-03-30 DIAGNOSIS — Z95818 Presence of other cardiac implants and grafts: Secondary | ICD-10-CM

## 2021-03-30 HISTORY — DX: Presence of other cardiac implants and grafts: Z95.818

## 2021-03-30 NOTE — Patient Instructions (Signed)
You can take a shower tomorrow evening.  After shower he can take the dressing off.  There will be 3 small Steri-Strips, you can leave them on until follow-up automatically after a week to 10 days.  Just keep the area dry.  You can also use a Band-Aid if necessary.  Please call us if there is any discharge or bleeding.  Please call us if there is any significant redness around the wound.

## 2021-03-30 NOTE — Progress Notes (Signed)
Chief Complaint  Patient presents with   implant    Loop recorder (stroke)   Cerebrovascular Accident    Cryptogenic stroke x 3    Physical exam:  Blood pressure 97/60, pulse 76, temperature 98 F (36.7 C), resp. rate 16, height 5\' 8"  (1.727 m), weight 217 lb (98.4 kg), SpO2 99 %.  Cardiac exam S1 and S2 is normal, 2/6 systolic ejection murmur in the aortic area. Chest exam clear. Abdominal examination benign.  Obese.  Bowel sounds heard.   Prodedure performed: Insertion of Medical illustrator. Serial [id number]  Indication: Patient with recurrent stroke and TIA.   After obtaining informed consent, explaining the procedure to the patient, under sterile precautions, local anesthesia with 1% lidocaine with epinephrine was injected subcutaneously in the left parasternal region.  20 mL utilized.  A small nick was made in the left paraspinal region at the 3rd intercostal space. Biotronik loop recorder was inserted without any complications.  Patient tolerated the procedure well.  The incision was closed with Skinseal glue and Steri-Strips.  There is no blood loss, no hematoma.  Written instrtuctions regarding wound care given to the patient.  Device setting:  R wave amplitude 0.66 to 0.81 mV.   Programmed:  AF detection to Medium/Individual HVR 140/min. Bradycardia 30 BPM Sudden rate drop Off Asystole 5 Sec Patient triggered: Off    ICD-10-CM   1. Cryptogenic stroke [facility])  I63.9        Adrian Prows, MD, [facility] 03/30/2021, 3:14 PM [facility] [place] Office: [phone]

## 2021-04-04 NOTE — Progress Notes (Signed)
Patient here for wound check. Patient underwent loop recorder implantation 5 days ago, which required closure with Skinseal glue and Steri-strips, no sutures.   Patient denies pain, redness, or drainage from the wound. The area was cleaned and examined. Wound is healing well without evidence of erythema, drainage, ecchymosis, hematoma.  Informed patient to monitor the area for signs of infection and to let the office know if these develop, they expressed understanding.  Vitals:   04/05/21 1150  BP: 137/63  Pulse: 73  Temp: 98 F (36.7 C)  SpO2: 100%    Alethia Berthold, PA-C 04/05/2021, 12:05 PM Office: [phone]

## 2021-04-05 ENCOUNTER — Other Ambulatory Visit: Payer: Self-pay

## 2021-04-05 ENCOUNTER — Ambulatory Visit: Payer: Medicare Other | Admitting: Student

## 2021-04-05 ENCOUNTER — Encounter: Payer: Self-pay | Admitting: Student

## 2021-04-05 VITALS — BP 137/63 | HR 73 | Temp 98.0°F | Ht 68.0 in | Wt 216.8 lb

## 2021-04-05 DIAGNOSIS — Z5189 Encounter for other specified aftercare: Secondary | ICD-10-CM

## 2021-04-29 DIAGNOSIS — I639 Cerebral infarction, unspecified: Secondary | ICD-10-CM | POA: Diagnosis not present

## 2021-04-29 DIAGNOSIS — Z4509 Encounter for adjustment and management of other cardiac device: Secondary | ICD-10-CM | POA: Diagnosis not present

## 2021-04-29 DIAGNOSIS — Z95818 Presence of other cardiac implants and grafts: Secondary | ICD-10-CM | POA: Diagnosis not present

## 2021-04-30 ENCOUNTER — Encounter: Payer: Self-pay | Admitting: Cardiology

## 2021-05-30 DIAGNOSIS — I639 Cerebral infarction, unspecified: Secondary | ICD-10-CM | POA: Diagnosis not present

## 2021-05-30 DIAGNOSIS — Z4509 Encounter for adjustment and management of other cardiac device: Secondary | ICD-10-CM | POA: Diagnosis not present

## 2021-05-30 DIAGNOSIS — Z95818 Presence of other cardiac implants and grafts: Secondary | ICD-10-CM | POA: Diagnosis not present

## 2021-06-05 ENCOUNTER — Telehealth: Payer: Self-pay | Admitting: Neurology

## 2021-06-05 NOTE — Telephone Encounter (Signed)
Dr. Leonie Man has called out sick for tomorrow, so I sent the patient a mychart message asking him to call the office to get rescheduled.

## 2021-06-06 ENCOUNTER — Ambulatory Visit: Payer: Medicare Other | Admitting: Neurology

## 2021-06-30 DIAGNOSIS — Z95818 Presence of other cardiac implants and grafts: Secondary | ICD-10-CM | POA: Diagnosis not present

## 2021-06-30 DIAGNOSIS — Z4509 Encounter for adjustment and management of other cardiac device: Secondary | ICD-10-CM | POA: Diagnosis not present

## 2021-06-30 DIAGNOSIS — R55 Syncope and collapse: Secondary | ICD-10-CM | POA: Diagnosis not present

## 2021-07-04 ENCOUNTER — Encounter: Payer: Self-pay | Admitting: Neurology

## 2021-07-04 ENCOUNTER — Ambulatory Visit (INDEPENDENT_AMBULATORY_CARE_PROVIDER_SITE_OTHER): Payer: Medicare Other | Admitting: Neurology

## 2021-07-04 VITALS — BP 131/69 | HR 70 | Ht 68.0 in | Wt 215.6 lb

## 2021-07-04 DIAGNOSIS — Z9189 Other specified personal risk factors, not elsewhere classified: Secondary | ICD-10-CM

## 2021-07-04 DIAGNOSIS — I639 Cerebral infarction, unspecified: Secondary | ICD-10-CM

## 2021-07-04 NOTE — Progress Notes (Signed)
?[facility] ?[address] ?[place]. [place] [address] ?[phone] ? ?     OFFICE FOLLOW VISIT NOTE ? ?[name] ?Date of Birth:  1940/06/28 ?Medical Record Number:  [id number]  ? ?Referring MD:  Dr Cathlean Sauer ? ?Reason for Referral:  TIA ? ?HPI: Initial visit 02/28/2021: Mr. Weigold is a 81 year old pleasant Caucasian male seen today for initial office consultation visit for TIA.  History is obtained from the patient, review of electronic medical records and opossum reviewed pertinent available imaging films in PACS.  He has past medical history of diabetes, hypertension, hyperlipidemia, hearing loss, gout and depression.  He was recently admitted to [facility] on 02/18/2021 when he developed sudden onset of left hand and left-sided face and lip numbness and tingling while watching TV at home.  Around 4 days prior to that he had an episode of blurred vision that resolved spontaneously within 1015 minutes and did not seek medical help.  His numbness also resolved within a few minutes.  MRI scan of the brain on admission showed a small left occipital infarct which likely is a represented his symptoms from 4 days prior.  CT angiogram of the brain and neck were both obtained showed no significant large vessel stenosis or occlusion.  Echocardiogram had been done on 11/24/2020 showed show normal ejection fraction without cardiac source of embolism.  LDL cholesterol 47 mg percent and hemoglobin A1c was 6.7.  Patient was started on aspirin and Plavix dual antiplatelet therapy for 3 weeks and states is done well since discharge he has had no recurrent episodes of numbness tingling, blurred vision or any other stroke or TIA symptoms.  Tolerating aspirin Plavix without bruising or bleeding.  He states blood pressure is under good control and today it is 143/74.  He is on Lipitor is tolerating well without muscle aches and pains.  He has a prior admission in August 2022 for left hemispheric  TIA when he presented with right hand and face numbness and some weakness while shopping in a grocery store.  MRI scan of the brain at that time had shown no acute infarct and small remote age right temporal occipital cortical infarct and changes of small vessel disease.  LDL cholesterol at that time was 47 mg percent.  And hemoglobin A1c 7.5.  Patient also had remote history of TIA in September 2017 for which she was last seen in the office for follow-up by [facility] nurse practitioner on 09/22/2016.  He was then lost to follow-up ?Update 07/04/2021 : He returns for follow-up after last visit 4 months ago.  He is doing well and has not had recurrent stroke or TIA symptoms.  He remains on Plavix 75 mg daily which is tolerating well without bleeding or bruising.  His blood pressure is under good control and today is 131/90.  He is tolerating Lipitor 80 mg well without muscle aches or pains.  His blood sugars fluctuate a little bit in working with his primary care physician.  Is under control.  Patient was asked admit to snoring and night.  He has never been tested for sleep apnea.  Did undergo loop recorder insertion by Dr. Einar Gip in December 2022 and so far paroxysmal A-fib has not yet been found.  He has no new complaints today. ? ? ?ROS:   ?14 system review of systems is positive for tingling, numbness, blurred vision all other systems negative ? ?PMH:  ?Past Medical History:  ?Diagnosis Date  ? Atypical chest  pain   ? Cryptogenic stroke ([place]) 11/23/2020  ? Depression   ? Diabetes mellitus without complication ([place])   ? Diabetic retinopathy ([place])   ? Gout   ? Hearing loss   ? Hyperlipidemia   ? Hypertension   ? Loop recorder Biotronik Biomonitor III 03/30/2021 03/30/2021  ? Onychomycosis   ? UTI (lower urinary tract infection)   ? ? ?Social History:  ?Social History  ? ?Socioeconomic History  ? Marital status: Married  ?  Spouse name: Olin Hauser  ? Number of children: 0  ? Years of education: College  ? Highest  education level: Not on file  ?Occupational History  ? Occupation: retired - Actor  ?Tobacco Use  ? Smoking status: Former  ?  Types: Cigarettes  ?  Quit date: 06/18/1985  ?  Years since quitting: 36.0  ? Smokeless tobacco: Never  ?Vaping Use  ? Vaping Use: Never used  ?Substance and Sexual Activity  ? Alcohol use: Yes  ?  Alcohol/week: 2.0 standard drinks  ?  Types: 2 Glasses of wine per week  ?  Comment: thinks he may be drinking too much, 2/4 + CAGE questions  ? Drug use: No  ? Sexual activity: Not on file  ?Other Topics Concern  ? Not on file  ?Social History Narrative  ? Lives with spouse  ? Caffeine use: 3 coffee/day  ? ?Social Determinants of Health  ? ?Financial Resource Strain: Not on file  ?Food Insecurity: Not on file  ?Transportation Needs: Not on file  ?Physical Activity: Not on file  ?Stress: Not on file  ?Social Connections: Not on file  ?Intimate Partner Violence: Not on file  ? ? ?Medications:   ?Current Outpatient Medications on File Prior to Visit  ?Medication Sig Dispense Refill  ? acetaminophen (TYLENOL) 325 MG tablet Take 650 mg by mouth every 6 (six) hours as needed for moderate pain or headache.    ? amLODipine (NORVASC) 5 MG tablet Take 5 mg by mouth every morning.    ? Artificial Tear Ointment (DRY EYES OP) Place 1 drop into both eyes daily as needed (dry eyes).    ? B Complex Vitamins (B COMPLEX PO) Take 1 tablet by mouth daily.    ? clopidogrel (PLAVIX) 75 MG tablet Take 1 tablet (75 mg total) by mouth daily. Continue taking this medication 30 tablet 1  ? FLUoxetine (PROZAC) 20 MG capsule Take 20 mg by mouth every morning.     ? fluticasone (FLONASE) 50 MCG/ACT nasal spray Place 1 spray into both nostrils daily as needed for allergies.     ? losartan (COZAAR) 100 MG tablet Take 100 mg by mouth daily.    ? metFORMIN (GLUCOPHAGE) 500 MG tablet Take 1,000 mg by mouth 2 (two) times daily with a meal.     ? Multiple Vitamins-Minerals (PRESERVISION AREDS 2+MULTI VIT) CAPS Take 1 capsule  by mouth in the morning and at bedtime.    ? sitaGLIPtin (JANUVIA) 100 MG tablet Take 100 mg by mouth daily.    ? tamsulosin (FLOMAX) 0.4 MG CAPS capsule Take 0.4 mg by mouth daily after supper.     ? traZODone (DESYREL) 150 MG tablet Take 50 mg by mouth at bedtime.    ? atorvastatin (LIPITOR) 80 MG tablet Take 1 tablet (80 mg total) by mouth every morning. 30 tablet 0  ? ?No current facility-administered medications on file prior to visit.  ? ? ?Allergies:   ?Allergies  ?Allergen Reactions  ? Shrimp [employer]  Allergy] Nausea And Vomiting  ? ? ?Physical Exam ?General: obese elderly caucasian male , seated, in no evident distress ?Head: head normocephalic and atraumatic.   ?Neck: supple with soft left carotid  bruit ?Cardiovascular: regular rate and rhythm, soft ejection systolic murmur  ?musculoskeletal: no deformity ?Skin:  no rash/petichiae ?Vascular:  Normal pulses all extremities ? ?Neurologic Exam ?Mental Status: Awake and fully alert. Oriented to place and time. Recent and remote memory intact. Attention span, concentration and fund of knowledge appropriate. Mood and affect appropriate.  ?Cranial Nerves: Fundoscopic exam not done. Pupils equal, briskly reactive to light. Extraocular movements full without nystagmus. Visual fields full to confrontation. Hearing intact. Facial sensation intact. Face, tongue, palate moves normally and symmetrically.  ?Motor: Normal bulk and tone. Normal strength in all tested extremity muscles. ?Sensory.: intact to touch , pinprick , position and vibratory sensation.  ?Coordination: Rapid alternating movements normal in all extremities. Finger-to-nose and heel-to-shin performed accurately bilaterally. ?Gait and Station: Arises from chair without difficulty. Stance is normal. Gait demonstrates normal stride length and balance . Able to heel, toe and tandem walk with slight difficulty.  ?Reflexes: 1+ and symmetric. Toes downgoing.  ? ?  ? ? ?ASSESSMENT: 81 year old Caucasian male  with episode of left hand and face numbness likely due to right subcortical TIA in November 2022 from small vessel disease.  MRI scan of the brain in November 2022 showing  tiny left occipital cortical inf

## 2021-07-04 NOTE — Patient Instructions (Addendum)
had a long d/w patient about his recent stroke, risk for recurrent stroke/TIAs, personally independently reviewed imaging studies and stroke evaluation results and answered questions.Continue   Plavixl 75 mg daily  for secondary stroke prevention and maintain strict control of hypertension with blood pressure goal below 130/90, diabetes with hemoglobin A1c goal below 6.5% and lipids with LDL cholesterol goal below 70 mg/dL. I also advised the patient to eat a healthy diet with plenty of whole grains, cereals, fruits and vegetables, exercise regularly and maintain ideal body weight .  Refer for polysomnogram for sleep apnea testing as he appears to be at risk.  Continue loop recorder follow-up with Dr. Einar Gip for paroxysmal A-fib detection.  Return for follow-up in 6 months with my nurse practitioner call earlier if necessary. ?

## 2021-07-13 DIAGNOSIS — E669 Obesity, unspecified: Secondary | ICD-10-CM | POA: Diagnosis not present

## 2021-07-13 DIAGNOSIS — G4700 Insomnia, unspecified: Secondary | ICD-10-CM | POA: Diagnosis not present

## 2021-07-13 DIAGNOSIS — E11319 Type 2 diabetes mellitus with unspecified diabetic retinopathy without macular edema: Secondary | ICD-10-CM | POA: Diagnosis not present

## 2021-07-13 DIAGNOSIS — E785 Hyperlipidemia, unspecified: Secondary | ICD-10-CM | POA: Diagnosis not present

## 2021-07-13 DIAGNOSIS — Z0000 Encounter for general adult medical examination without abnormal findings: Secondary | ICD-10-CM | POA: Diagnosis not present

## 2021-07-13 DIAGNOSIS — I679 Cerebrovascular disease, unspecified: Secondary | ICD-10-CM | POA: Diagnosis not present

## 2021-07-13 DIAGNOSIS — F324 Major depressive disorder, single episode, in partial remission: Secondary | ICD-10-CM | POA: Diagnosis not present

## 2021-07-13 DIAGNOSIS — R32 Unspecified urinary incontinence: Secondary | ICD-10-CM | POA: Diagnosis not present

## 2021-07-13 DIAGNOSIS — I700 Atherosclerosis of aorta: Secondary | ICD-10-CM | POA: Diagnosis not present

## 2021-07-13 DIAGNOSIS — I10 Essential (primary) hypertension: Secondary | ICD-10-CM | POA: Diagnosis not present

## 2021-07-31 DIAGNOSIS — Z95818 Presence of other cardiac implants and grafts: Secondary | ICD-10-CM | POA: Diagnosis not present

## 2021-07-31 DIAGNOSIS — I639 Cerebral infarction, unspecified: Secondary | ICD-10-CM | POA: Diagnosis not present

## 2021-07-31 DIAGNOSIS — Z4509 Encounter for adjustment and management of other cardiac device: Secondary | ICD-10-CM | POA: Diagnosis not present

## 2021-08-08 DIAGNOSIS — R351 Nocturia: Secondary | ICD-10-CM | POA: Diagnosis not present

## 2021-08-08 DIAGNOSIS — N3941 Urge incontinence: Secondary | ICD-10-CM | POA: Diagnosis not present

## 2021-08-08 DIAGNOSIS — R3915 Urgency of urination: Secondary | ICD-10-CM | POA: Diagnosis not present

## 2021-08-08 DIAGNOSIS — R350 Frequency of micturition: Secondary | ICD-10-CM | POA: Diagnosis not present

## 2021-08-08 DIAGNOSIS — N401 Enlarged prostate with lower urinary tract symptoms: Secondary | ICD-10-CM | POA: Diagnosis not present

## 2021-08-08 DIAGNOSIS — R3911 Hesitancy of micturition: Secondary | ICD-10-CM | POA: Diagnosis not present

## 2021-08-16 ENCOUNTER — Ambulatory Visit: Payer: Medicare Other | Admitting: Neurology

## 2021-08-25 ENCOUNTER — Telehealth: Payer: Self-pay | Admitting: Neurology

## 2021-08-25 ENCOUNTER — Institutional Professional Consult (permissible substitution): Payer: Medicare Other | Admitting: Neurology

## 2021-08-25 NOTE — Telephone Encounter (Signed)
Pt presented today for his sleep consult apt with Dr Brett Fairy. Apt was scheduled at 11 am with check in of 10:30 am. Pt was checked in shortly after 10:30 am. I went to get he patient at 11:04 for his 11 am appointment and he had just walked out of the office stating he was leaving. He stated that he was suppose to be seen at 10:30 am and when the front staff asked for his name, he handed her his clipboard and walked out. Pt was considered a no show today. ?

## 2021-08-29 ENCOUNTER — Encounter: Payer: Self-pay | Admitting: Neurology

## 2021-08-31 ENCOUNTER — Telehealth: Payer: Self-pay

## 2021-08-31 DIAGNOSIS — I639 Cerebral infarction, unspecified: Secondary | ICD-10-CM | POA: Diagnosis not present

## 2021-08-31 DIAGNOSIS — Z4509 Encounter for adjustment and management of other cardiac device: Secondary | ICD-10-CM | POA: Diagnosis not present

## 2021-08-31 DIAGNOSIS — Z95818 Presence of other cardiac implants and grafts: Secondary | ICD-10-CM | POA: Diagnosis not present

## 2021-08-31 NOTE — Telephone Encounter (Signed)
Patient left office on 5/11 without being seen. Called today 5/17 to r/s appt. I notified pt that due to him leaving before he was seen he was marked a no show and would have to pay the no show before rescheduling him. Pt said he no longer wished to be seen.  ?

## 2021-09-19 DIAGNOSIS — R3915 Urgency of urination: Secondary | ICD-10-CM | POA: Diagnosis not present

## 2021-09-19 DIAGNOSIS — R351 Nocturia: Secondary | ICD-10-CM | POA: Diagnosis not present

## 2021-09-19 DIAGNOSIS — N401 Enlarged prostate with lower urinary tract symptoms: Secondary | ICD-10-CM | POA: Diagnosis not present

## 2021-10-01 DIAGNOSIS — Z4509 Encounter for adjustment and management of other cardiac device: Secondary | ICD-10-CM | POA: Diagnosis not present

## 2021-10-01 DIAGNOSIS — Z95818 Presence of other cardiac implants and grafts: Secondary | ICD-10-CM | POA: Diagnosis not present

## 2021-10-01 DIAGNOSIS — I639 Cerebral infarction, unspecified: Secondary | ICD-10-CM | POA: Diagnosis not present

## 2021-11-01 DIAGNOSIS — Z4509 Encounter for adjustment and management of other cardiac device: Secondary | ICD-10-CM | POA: Diagnosis not present

## 2021-11-01 DIAGNOSIS — I639 Cerebral infarction, unspecified: Secondary | ICD-10-CM | POA: Diagnosis not present

## 2021-11-01 DIAGNOSIS — Z95818 Presence of other cardiac implants and grafts: Secondary | ICD-10-CM | POA: Diagnosis not present

## 2021-12-02 DIAGNOSIS — I639 Cerebral infarction, unspecified: Secondary | ICD-10-CM | POA: Diagnosis not present

## 2021-12-02 DIAGNOSIS — Z4509 Encounter for adjustment and management of other cardiac device: Secondary | ICD-10-CM | POA: Diagnosis not present

## 2021-12-02 DIAGNOSIS — Z95818 Presence of other cardiac implants and grafts: Secondary | ICD-10-CM | POA: Diagnosis not present

## 2021-12-29 DIAGNOSIS — E668 Other obesity: Secondary | ICD-10-CM | POA: Diagnosis not present

## 2021-12-29 DIAGNOSIS — Z23 Encounter for immunization: Secondary | ICD-10-CM | POA: Diagnosis not present

## 2021-12-29 DIAGNOSIS — E11319 Type 2 diabetes mellitus with unspecified diabetic retinopathy without macular edema: Secondary | ICD-10-CM | POA: Diagnosis not present

## 2021-12-29 DIAGNOSIS — F439 Reaction to severe stress, unspecified: Secondary | ICD-10-CM | POA: Diagnosis not present

## 2022-01-02 DIAGNOSIS — Z4509 Encounter for adjustment and management of other cardiac device: Secondary | ICD-10-CM | POA: Diagnosis not present

## 2022-01-02 DIAGNOSIS — I639 Cerebral infarction, unspecified: Secondary | ICD-10-CM | POA: Diagnosis not present

## 2022-01-02 DIAGNOSIS — Z95818 Presence of other cardiac implants and grafts: Secondary | ICD-10-CM | POA: Diagnosis not present

## 2022-01-03 NOTE — Progress Notes (Unsigned)
[facility] [address] [place]. [place] [address] [phone]       OFFICE FOLLOW VISIT NOTE  [name] Date of Birth:  22-Apr-1940 Medical Record Number:  [id number]   Primary neurologist: Dr. Leonie Man Reason for Referral:  TIA   No chief complaint on file.     HPI:   Update 01/04/2022 JM: Patient returns for 82-monthstroke follow-up.  Overall stable without new or reoccurring stroke/TIA symptoms.  Compliant on Plavix and atorvastatin.  Blood pressure well controlled. PCP f/u ***.  Loop recorder monitored by cardiologist Dr. GEinar Gipwithout evidence of A-fib thus far.  Previously referred for underlying sleep apnea, he left office prior to being seen. No further concerns at this time.     History provided for reference purposes only Update 07/04/2021 Dr. SLeonie Man He returns for follow-up after last visit 4 months ago.  He is doing well and has not had recurrent stroke or TIA symptoms.  He remains on Plavix 75 mg daily which is tolerating well without bleeding or bruising.  His blood pressure is under good control and today is 131/90.  He is tolerating Lipitor 80 mg well without muscle aches or pains.  His blood sugars fluctuate a little bit in working with his primary care physician.  Is under control.  Patient was asked admit to snoring and night.  He has never been tested for sleep apnea.  Did undergo loop recorder insertion by Dr. GEinar Gipin December 2022 and so far paroxysmal A-fib has not yet been found.  He has no new complaints today.   Initial visit 02/28/2021 Dr. SLeonie Man Mr. WLoftonis a 81year old pleasant Caucasian male seen today for initial office consultation visit for TIA.  History is obtained from the patient, review of electronic medical records and opossum reviewed pertinent available imaging films in PACS.  He has past medical history of diabetes, hypertension, hyperlipidemia, hearing loss, gout and depression.  [facility] he developed sudden onset of left hand and left-sided face and lip numbness and tingling while watching TV at home.  Around 4 days prior to that he had an episode of blurred vision that resolved spontaneously within 1015 minutes and did not seek medical help.  His numbness also resolved within a few minutes.  MRI scan of the brain on admission showed a small left occipital infarct which likely is a represented his symptoms from 4 days prior.  CT angiogram of the brain and neck were both obtained showed no significant large vessel stenosis or occlusion.  Echocardiogram had been done on 11/24/2020 showed show normal ejection fraction without cardiac source of embolism.  LDL cholesterol 47 mg percent and hemoglobin A1c was 6.7.  Patient was started on aspirin and Plavix dual antiplatelet therapy for 3 weeks and states is done well since discharge he has had no recurrent episodes of numbness tingling, blurred vision or any other stroke or TIA symptoms.  Tolerating aspirin Plavix without bruising or bleeding.  He states blood pressure is under good control and today it is 143/74.  He is on Lipitor is tolerating well without muscle aches and pains.  He has a prior admission in August 2022 for left hemispheric TIA when he presented with right hand and face numbness and some weakness while shopping in a grocery store.  MRI scan of the brain at that time had shown no acute infarct and small remote age right temporal occipital cortical infarct  and changes of small vessel disease.  LDL cholesterol at that time was 47 mg percent.  And hemoglobin A1c 7.5.  Patient also had remote history of TIA in September 2017 for which she was last seen in the office for follow-up by Gilford Raid nurse practitioner on 09/22/2016.  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   Depression    Diabetes mellitus without complication ([place])    Diabetic retinopathy ([place])    Gout    Hearing loss    Hyperlipidemia    Hypertension    Loop recorder Biotronik Biomonitor III 03/30/2021 03/30/2021   Onychomycosis    UTI (lower urinary tract infection)     Social History:  Social History   Socioeconomic History   Marital status: Married    Spouse name: Vincent Alvarez   Number of children: 0   Years of education: College   Highest education level: Not on file  Occupational History   Occupation: retired - postal service  Tobacco Use   Smoking status: Former    Types: Cigarettes    Quit date: 06/18/1985    Years since quitting: 36.5   Smokeless tobacco: Never  Vaping Use   Vaping Use: Never used  Substance and Sexual Activity   Alcohol use: Yes    Alcohol/week: 2.0 standard drinks of alcohol    Types: 2 Glasses of wine per week    Comment: thinks he may be drinking too much, 2/4 + CAGE questions   Drug use: No   Sexual activity: Not on file  Other Topics Concern   Not on file  Social History Narrative   Lives with spouse   Caffeine use: 3 coffee/day   Social Determinants of Health   Financial Resource Strain: Not on file  Food Insecurity: Not on file  Transportation Needs: Not on file  Physical Activity: Not on file  Stress: Not on file  Social Connections: Not on file  Intimate Partner Violence: Not on file    Medications:   Current Outpatient Medications on File Prior to Visit  Medication Sig Dispense Refill   acetaminophen (TYLENOL) 325 MG tablet Take 650 mg by mouth every 6 (six) hours as needed for moderate pain or headache.     amLODipine (NORVASC) 5 MG tablet Take 5 mg by mouth every morning.     Artificial Tear Ointment (DRY EYES OP) Place 1 drop into both eyes daily as needed (dry eyes).     atorvastatin (LIPITOR) 80 MG tablet Take 1 tablet (80 mg total) by  mouth every morning. 30 tablet 0   B Complex Vitamins (B COMPLEX PO) Take 1 tablet by mouth daily.     clopidogrel (PLAVIX) 75 MG tablet Take 1 tablet (75 mg total) by mouth daily. Continue taking this medication 30 tablet 1   FLUoxetine (PROZAC) 20 MG capsule Take 20 mg by mouth every morning.      fluticasone (FLONASE) 50 MCG/ACT nasal spray Place 1 spray into both nostrils daily as needed for allergies.      losartan (COZAAR) 100 MG tablet Take 100 mg by mouth daily.     metFORMIN (GLUCOPHAGE) 500 MG tablet Take 1,000 mg by mouth 2 (two) times daily with a meal.      Multiple Vitamins-Minerals (  PRESERVISION AREDS 2+MULTI VIT) CAPS Take 1 capsule by mouth in the morning and at bedtime.     sitaGLIPtin (JANUVIA) 100 MG tablet Take 100 mg by mouth daily.     tamsulosin (FLOMAX) 0.4 MG CAPS capsule Take 0.4 mg by mouth daily after supper.      traZODone (DESYREL) 150 MG tablet Take 50 mg by mouth at bedtime.     No current facility-administered medications on file prior to visit.    Allergies:   Allergies  Allergen Reactions   Shrimp [Shellfish Allergy] Nausea And Vomiting    Physical Exam There were no vitals filed for this visit. There is no height or weight on file to calculate BMI.   General: obese elderly caucasian male , seated, in no evident distress Head: head normocephalic and atraumatic.   Neck: supple with soft left carotid  bruit Cardiovascular: regular rate and rhythm, soft ejection systolic murmur  musculoskeletal: no deformity Skin:  no rash/petichiae Vascular:  Normal pulses all extremities  Neurologic Exam Mental Status: Awake and fully alert. Oriented to place and time. Recent and remote memory intact. Attention span, concentration and fund of knowledge appropriate. Mood and affect appropriate.  Cranial Nerves: Pupils equal, briskly reactive to light. Extraocular movements full without nystagmus. Visual fields full to confrontation. Hearing intact. Facial  sensation intact. Face, tongue, palate moves normally and symmetrically.  Motor: Normal bulk and tone. Normal strength in all tested extremity muscles. Sensory.: intact to touch , pinprick , position and vibratory sensation.  Coordination: Rapid alternating movements normal in all extremities. Finger-to-nose and heel-to-shin performed accurately bilaterally. Gait and Station: Arises from chair without difficulty. Stance is normal. Gait demonstrates normal stride length and balance . Able to heel, toe and tandem walk with slight difficulty.  Reflexes: 1+ and symmetric. Toes downgoing.       ASSESSMENT/PLAN: 81 year old Caucasian male with episode of left hand and face numbness likely due to right subcortical TIA in November 2022 from small vessel disease.  MRI scan of the brain in November 2022 showing  tiny left occipital cortical infarct of cryptogenic etiology which likely correlated with his episode of blurred vision 4 days prior..  Remote history of TIA in September 2017 and prior right temporal cortical infarct on MRI.  Vascular risk factors of hypertension, diabetes, hyperlipidemia, cerebrovascular disease and age.    -Continue Plavix 75 mg daily and atorvastatin 80 mg daily for secondary stroke prevention  -Continue close PCP follow-up for aggressive stroke risk factor management including BP goal<130/90, HLD with LDL goal<70 and DM with A1c.<7  -Encouraged re-scheduling sleep consult visit with Dr. Brett Fairy (previously left without being seen) -continue loop recorder follow-up with Dr. Einar Gip for paroxysmal A-fib detection.         I spent *** minutes of face-to-face and non-face-to-face time with patient.  This included previsit chart review, lab review, study review, order entry, electronic health record documentation, patient education  Frann Rider, [facility]  [facility] [address] [place], [place] [address]  Phone [phone] Fax  [phone] Note: This document was prepared with digital dictation and possible smart phrase technology. Any transcriptional errors that result from this process are unintentional.

## 2022-01-04 ENCOUNTER — Ambulatory Visit (INDEPENDENT_AMBULATORY_CARE_PROVIDER_SITE_OTHER): Payer: Medicare Other | Admitting: Adult Health

## 2022-01-04 ENCOUNTER — Encounter: Payer: Self-pay | Admitting: Adult Health

## 2022-01-04 VITALS — BP 121/58 | HR 77 | Ht 68.0 in | Wt 215.0 lb

## 2022-01-04 DIAGNOSIS — I639 Cerebral infarction, unspecified: Secondary | ICD-10-CM | POA: Diagnosis not present

## 2022-01-04 DIAGNOSIS — G459 Transient cerebral ischemic attack, unspecified: Secondary | ICD-10-CM | POA: Diagnosis not present

## 2022-01-04 NOTE — Patient Instructions (Signed)
Continue clopidogrel 75 mg daily  and atorvastatin (Lipitor)  for secondary stroke prevention  Your loop recorder will continue to be monitored by Dr. Einar Gip for possible atrial fibrillation   Continue to follow up with PCP regarding cholesterol, blood pressure and diabetes management  Maintain strict control of hypertension with blood pressure goal below 130/90, diabetes with hemoglobin A1c goal below 7.0 % and cholesterol with LDL cholesterol (bad cholesterol) goal below 70 mg/dL.   Signs of a Stroke? Follow the BEFAST method:  Balance Watch for a sudden loss of balance, trouble with coordination or vertigo Eyes Is there a sudden loss of vision in one or both eyes? Or double vision?  Face: Ask the person to smile. Does one side of the face droop or is it numb?  Arms: Ask the person to raise both arms. Does one arm drift downward? Is there weakness or numbness of a leg? Speech: Ask the person to repeat a simple phrase. Does the speech sound slurred/strange? Is the person confused ? Time: If you observe any of these signs, call [phone].      Thank you for coming to see [place] at [facility]. I hope we have been able to provide you high quality care today.  You may receive a patient satisfaction survey over the next few weeks. We would appreciate your feedback and comments so that we may continue to improve ourselves and the health of our patients.

## 2022-02-02 DIAGNOSIS — Z4509 Encounter for adjustment and management of other cardiac device: Secondary | ICD-10-CM | POA: Diagnosis not present

## 2022-02-02 DIAGNOSIS — I639 Cerebral infarction, unspecified: Secondary | ICD-10-CM | POA: Diagnosis not present

## 2022-02-02 DIAGNOSIS — Z95818 Presence of other cardiac implants and grafts: Secondary | ICD-10-CM | POA: Diagnosis not present

## 2022-02-20 DIAGNOSIS — M25552 Pain in left hip: Secondary | ICD-10-CM | POA: Diagnosis not present

## 2022-02-20 DIAGNOSIS — M1712 Unilateral primary osteoarthritis, left knee: Secondary | ICD-10-CM | POA: Diagnosis not present

## 2022-02-20 DIAGNOSIS — M545 Low back pain, unspecified: Secondary | ICD-10-CM | POA: Diagnosis not present

## 2022-03-05 DIAGNOSIS — Z95818 Presence of other cardiac implants and grafts: Secondary | ICD-10-CM | POA: Diagnosis not present

## 2022-03-05 DIAGNOSIS — Z4509 Encounter for adjustment and management of other cardiac device: Secondary | ICD-10-CM | POA: Diagnosis not present

## 2022-03-05 DIAGNOSIS — I639 Cerebral infarction, unspecified: Secondary | ICD-10-CM | POA: Diagnosis not present

## 2022-03-08 DIAGNOSIS — Z961 Presence of intraocular lens: Secondary | ICD-10-CM | POA: Diagnosis not present

## 2022-03-08 DIAGNOSIS — H353131 Nonexudative age-related macular degeneration, bilateral, early dry stage: Secondary | ICD-10-CM | POA: Diagnosis not present

## 2022-03-08 DIAGNOSIS — E113293 Type 2 diabetes mellitus with mild nonproliferative diabetic retinopathy without macular edema, bilateral: Secondary | ICD-10-CM | POA: Diagnosis not present

## 2022-03-20 DIAGNOSIS — M545 Low back pain, unspecified: Secondary | ICD-10-CM | POA: Diagnosis not present

## 2022-03-20 DIAGNOSIS — M25571 Pain in right ankle and joints of right foot: Secondary | ICD-10-CM | POA: Diagnosis not present

## 2022-03-20 DIAGNOSIS — M1712 Unilateral primary osteoarthritis, left knee: Secondary | ICD-10-CM | POA: Diagnosis not present

## 2022-04-05 ENCOUNTER — Ambulatory Visit: Payer: Medicare Other | Admitting: Student

## 2022-04-05 DIAGNOSIS — Z95818 Presence of other cardiac implants and grafts: Secondary | ICD-10-CM | POA: Diagnosis not present

## 2022-04-05 DIAGNOSIS — I639 Cerebral infarction, unspecified: Secondary | ICD-10-CM | POA: Diagnosis not present

## 2022-04-05 DIAGNOSIS — Z4509 Encounter for adjustment and management of other cardiac device: Secondary | ICD-10-CM | POA: Diagnosis not present

## 2022-04-12 ENCOUNTER — Ambulatory Visit: Payer: Medicare Other | Admitting: Internal Medicine

## 2022-04-12 ENCOUNTER — Encounter: Payer: Self-pay | Admitting: Internal Medicine

## 2022-04-12 VITALS — BP 148/78 | HR 80 | Ht 68.0 in | Wt 216.4 lb

## 2022-04-12 DIAGNOSIS — I639 Cerebral infarction, unspecified: Secondary | ICD-10-CM

## 2022-04-12 DIAGNOSIS — E7800 Pure hypercholesterolemia, unspecified: Secondary | ICD-10-CM | POA: Diagnosis not present

## 2022-04-12 DIAGNOSIS — I10 Essential (primary) hypertension: Secondary | ICD-10-CM | POA: Diagnosis not present

## 2022-04-12 NOTE — Progress Notes (Signed)
Primary Physician/Referring:  Lawerance Cruel, MD  Patient ID: Vincent Alvarez, male    DOB: Apr 12, 1941, 81 y.o.   MRN: [id number]  Chief Complaint  Patient presents with   Cryptogenic stroke   Follow-up   HPI:    Vincent Alvarez  is a 81 y.o. male with history of diabetes, hypertension, hyperlipidemia, hearing loss, gout and depression. Patient with history of recurrent TIAs and cryptogenic stroke, first in 2017 and most recently November 2022.  Patient follows with Dr. Leonie Man and neurology and is presently on Plavix.  Patient's recent work-up 02/2021 revealed no significant extra cerebral vascular disease, stroke was felt to be cryptogenic/embolic.  She is now referred by Dr. Bunnie Pion for consideration of loop recorder implantation.  Patient is here for a follow-up visit. 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 No new concerns or complaints since the last time he was here.  Denies chest pain, palpitations, syncope, near syncope.  Denies orthopnea, PND, leg edema.  Patient denies symptoms of claudication.    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   Depression    Diabetes mellitus without complication ([place])    Diabetic retinopathy ([place])    Gout    Hearing loss    Hyperlipidemia    Hypertension    Loop recorder Biotronik Biomonitor III 03/30/2021 03/30/2021   Onychomycosis    UTI (lower urinary tract infection)    Past Surgical History:  Procedure Laterality Date   CATARACT EXTRACTION, BILATERAL     CHOLECYSTECTOMY     COLONOSCOPY     TONSILLECTOMY     Family History  Problem Relation Age of Onset   CAD Mother 38   Colon cancer Father 13   Stroke Brother 35       4 strokes   Lung cancer Brother     Social History   Tobacco Use   Smoking status: Former    Types: Cigarettes    Quit date: 06/18/1985    Years since quitting: 36.8   Smokeless tobacco: Never   Substance Use Topics   Alcohol use: Yes    Alcohol/week: 2.0 standard drinks of alcohol    Types: 2 Glasses of wine per week    Comment: thinks he may be drinking too much, 2/4 + CAGE questions   Marital Status: Married   ROS  Review of Systems  Constitutional: Negative for malaise/fatigue and weight gain.  Cardiovascular:  Negative for chest pain, claudication, leg swelling, near-syncope, orthopnea, palpitations, paroxysmal nocturnal dyspnea and syncope.  Respiratory:  Negative for shortness of breath.   Neurological:  Negative for dizziness.    Objective  Blood pressure (!) 148/78, pulse 80, height '5\' 8"'$  (1.727 m), weight 216 lb 6.4 oz (98.2 kg), SpO2 98 %.     04/12/2022    2:19 PM 04/12/2022    2:12 PM 01/04/2022    2:43 PM  Vitals with BMI  Height  '5\' 8"'$  '5\' 8"'$   Weight  216 lbs 6 oz 215 lbs  BMI  40.98 11.9  Systolic 147 829 562  Diastolic 78 81 58  Pulse 80 77 77      Physical Exam Vitals reviewed.  Neck:     Vascular: No carotid bruit or JVD.  Cardiovascular:     Rate and Rhythm: Normal rate and regular rhythm.     Pulses: Intact distal pulses.  Heart sounds: S1 normal and S2 normal. Murmur heard.     Systolic murmur is present. Soft systolic ejection murmur     No gallop.  Pulmonary:     Effort: Pulmonary effort is normal. No respiratory distress.     Breath sounds: No wheezing, rhonchi or rales.  Musculoskeletal:     Right lower leg: Edema (trace ankle) present.     Left lower leg: Edema (trace ankle) present.  Neurological:     Mental Status: He is alert.     Laboratory examination:   No results for input(s): "NA", "K", "CL", "CO2", "GLUCOSE", "BUN", "CREATININE", "CALCIUM", "GFRNONAA", "GFRAA" in the last 8760 hours.  CrCl cannot be calculated (Patient's most recent lab result is older than the maximum 21 days allowed.).     Latest Ref Rng & Units 02/18/2021    9:41 PM 02/18/2021    8:39 PM 11/24/2020    8:00 AM  CMP  Glucose 70 - 99 mg/dL  122  125  165   BUN 8 - 23 mg/dL '12  10  16   '$ Creatinine 0.61 - 1.24 mg/dL 1.10  1.05  1.05   Sodium 135 - 145 mmol/L 135  135  137   Potassium 3.5 - 5.1 mmol/L 4.0  4.2  4.0   Chloride 98 - 111 mmol/L 96  98  103   CO2 22 - 32 mmol/L  27  28   Calcium 8.9 - 10.3 mg/dL  9.8  9.2   Total Protein 6.5 - 8.1 g/dL  6.8    Total Bilirubin 0.3 - 1.2 mg/dL  0.6    Alkaline Phos 38 - 126 U/L  68    AST 15 - 41 U/L  16    ALT 0 - 44 U/L  15        Latest Ref Rng & Units 02/18/2021    9:41 PM 02/18/2021    8:39 PM 11/23/2020    3:21 PM  CBC  WBC 4.0 - 10.5 K/uL  6.9  6.5   Hemoglobin 13.0 - 17.0 g/dL 13.3  12.9  12.8   Hematocrit 39.0 - 52.0 % 39.0  39.0  39.5   Platelets 150 - 400 K/uL  278  257    Lipid Panel No results for input(s): "CHOL", "TRIG", "LDLCALC", "VLDL", "HDL", "CHOLHDL", "LDLDIRECT" in the last 8760 hours.  HEMOGLOBIN A1C Lab Results  Component Value Date   HGBA1C 6.7 (H) 02/19/2021   MPG 145.59 02/19/2021   TSH No results for input(s): "TSH" in the last 8760 hours.  External labs:   None   Allergies   Allergies  Allergen Reactions   Shrimp [Shellfish Allergy] Nausea And Vomiting    Medications Prior to Visit:   Outpatient Medications Prior to Visit  Medication Sig Dispense Refill   acetaminophen (TYLENOL) 325 MG tablet Take 650 mg by mouth every 6 (six) hours as needed for moderate pain or headache.     amLODipine (NORVASC) 5 MG tablet Take 5 mg by mouth every morning.     Artificial Tear Ointment (DRY EYES OP) Place 1 drop into both eyes daily as needed (dry eyes).     atorvastatin (LIPITOR) 80 MG tablet Take 1 tablet (80 mg total) by mouth every morning. 30 tablet 0   B Complex Vitamins (B COMPLEX PO) Take 1 tablet by mouth daily.     clopidogrel (PLAVIX) 75 MG tablet Take 1 tablet (75 mg total) by mouth daily. Continue taking this medication 30 tablet  1   FLUoxetine (PROZAC) 20 MG capsule Take 20 mg by mouth every morning.      fluticasone (FLONASE) 50  MCG/ACT nasal spray Place 1 spray into both nostrils daily as needed for allergies.      losartan (COZAAR) 100 MG tablet Take 100 mg by mouth daily.     metFORMIN (GLUCOPHAGE) 500 MG tablet Take 1,000 mg by mouth 2 (two) times daily with a meal.      Multiple Vitamins-Minerals (PRESERVISION AREDS 2+MULTI VIT) CAPS Take 1 capsule by mouth in the morning and at bedtime.     sitaGLIPtin (JANUVIA) 100 MG tablet Take 100 mg by mouth daily.     tamsulosin (FLOMAX) 0.4 MG CAPS capsule Take 0.4 mg by mouth daily after supper.      traZODone (DESYREL) 150 MG tablet Take 50 mg by mouth at bedtime.     No facility-administered medications prior to visit.   Final Medications at End of Visit    Current Meds  Medication Sig   acetaminophen (TYLENOL) 325 MG tablet Take 650 mg by mouth every 6 (six) hours as needed for moderate pain or headache.   amLODipine (NORVASC) 5 MG tablet Take 5 mg by mouth every morning.   Artificial Tear Ointment (DRY EYES OP) Place 1 drop into both eyes daily as needed (dry eyes).   atorvastatin (LIPITOR) 80 MG tablet Take 1 tablet (80 mg total) by mouth every morning.   B Complex Vitamins (B COMPLEX PO) Take 1 tablet by mouth daily.   clopidogrel (PLAVIX) 75 MG tablet Take 1 tablet (75 mg total) by mouth daily. Continue taking this medication   FLUoxetine (PROZAC) 20 MG capsule Take 20 mg by mouth every morning.    fluticasone (FLONASE) 50 MCG/ACT nasal spray Place 1 spray into both nostrils daily as needed for allergies.    losartan (COZAAR) 100 MG tablet Take 100 mg by mouth daily.   metFORMIN (GLUCOPHAGE) 500 MG tablet Take 1,000 mg by mouth 2 (two) times daily with a meal.    Multiple Vitamins-Minerals (PRESERVISION AREDS 2+MULTI VIT) CAPS Take 1 capsule by mouth in the morning and at bedtime.   sitaGLIPtin (JANUVIA) 100 MG tablet Take 100 mg by mouth daily.   tamsulosin (FLOMAX) 0.4 MG CAPS capsule Take 0.4 mg by mouth daily after supper.    traZODone (DESYREL) 150 MG  tablet Take 50 mg by mouth at bedtime.   Radiology:   No results found. CTA head and neck 02/19/2021: 1. Negative for large vessel occlusion. 2. Generally mild for age atherosclerosis in the head and neck. Calcified plaque most pronounced at the left ICA bulb, but no significant arterial stenosis. 3. Stable CT appearance of the brain since yesterday. Punctate left occipital infarct remains occult by CT.  Brain MRI 02/18/2021: 1. Punctate focus of acute ischemia in the left occipital lobe. No hemorrhage or mass effect. 2. Moderate chronic small vessel ischemic disease and volume loss.  Cardiac Studies:   Echocardiogram 11/24/2020: 1. Left ventricular ejection fraction, by estimation, is 65 to 70%. The  left ventricle has normal function. The left ventricle has no regional wall motion abnormalities. There is mild left ventricular hypertrophy.  Left ventricular diastolic parameters are consistent with Grade I diastolic dysfunction (impaired relaxation). Elevated left ventricular end-diastolic pressure. The E/e' is 42.   2. Right ventricular systolic function is normal. The right ventricular  size is normal. Tricuspid regurgitation signal is inadequate for assessing PA pressure.   3. The mitral valve is  abnormal. Trivial mitral valve regurgitation.   4. The aortic valve is tricuspid. Aortic valve regurgitation is not visualized. Mild to moderate aortic valve sclerosis/calcification is present, without any evidence of aortic stenosis.   5. The inferior vena cava is normal in size with greater than 50%  respiratory variability, suggesting right atrial pressure of 3 mmHg.  EKG:   03/23/2021: Normal sinus rhythm at rate of 68 bpm, left axis deviation, left anterior fascicle block.  Right bundle branch block.  Bifascicular block.  T wave abnormality, cannot exclude inferior ischemia.  Abnormal EKG. No significant change from 11/22/2020.  Assessment     ICD-10-CM   1. Cryptogenic stroke (HCC)  I63.9  EKG 12-Lead    2. Primary hypertension  I10     3. Hypercholesterolemia  E78.00        There are no discontinued medications.   No orders of the defined types were placed in this encounter.   Recommendations:   Vincent Alvarez is a 81 y.o. male with history of diabetes, hypertension, hyperlipidemia, hearing loss, gout and depression. Patient with history of recurrent TIAs and cryptogenic stroke, first in 2017 and most recently November 2022.  Patient follows with Dr. Leonie Man and neurology and is presently on Plavix.  Patient's recent work-up 02/2021 revealed no significant extra cerebral vascular disease, stroke was felt to be cryptogenic/embolic.  She is now referred by Dr. Bunnie Pion for consideration of loop recorder implantation.   Cryptogenic stroke (HCC) Continue plavix   Primary hypertension Continue current cardiac medications. Encourage low-sodium diet, less than 2000 mg daily. Patient states BP is <130/80 outside of doctors office   Hypercholesterolemia Continue statin    Floydene Flock, DO, [facility] 04/17/2022, 3:09 PM Office: [phone]

## 2022-05-06 DIAGNOSIS — Z4509 Encounter for adjustment and management of other cardiac device: Secondary | ICD-10-CM | POA: Diagnosis not present

## 2022-05-06 DIAGNOSIS — Z95818 Presence of other cardiac implants and grafts: Secondary | ICD-10-CM | POA: Diagnosis not present

## 2022-05-06 DIAGNOSIS — I639 Cerebral infarction, unspecified: Secondary | ICD-10-CM | POA: Diagnosis not present

## 2022-06-06 DIAGNOSIS — Z95818 Presence of other cardiac implants and grafts: Secondary | ICD-10-CM | POA: Diagnosis not present

## 2022-06-06 DIAGNOSIS — I639 Cerebral infarction, unspecified: Secondary | ICD-10-CM | POA: Diagnosis not present

## 2022-06-06 DIAGNOSIS — Z4509 Encounter for adjustment and management of other cardiac device: Secondary | ICD-10-CM | POA: Diagnosis not present

## 2022-06-19 DIAGNOSIS — M7661 Achilles tendinitis, right leg: Secondary | ICD-10-CM | POA: Diagnosis not present

## 2022-07-07 DIAGNOSIS — Z4509 Encounter for adjustment and management of other cardiac device: Secondary | ICD-10-CM | POA: Diagnosis not present

## 2022-07-07 DIAGNOSIS — Z95818 Presence of other cardiac implants and grafts: Secondary | ICD-10-CM | POA: Diagnosis not present

## 2022-07-07 DIAGNOSIS — I639 Cerebral infarction, unspecified: Secondary | ICD-10-CM | POA: Diagnosis not present

## 2022-07-18 DIAGNOSIS — E11319 Type 2 diabetes mellitus with unspecified diabetic retinopathy without macular edema: Secondary | ICD-10-CM | POA: Diagnosis not present

## 2022-07-18 DIAGNOSIS — I10 Essential (primary) hypertension: Secondary | ICD-10-CM | POA: Diagnosis not present

## 2022-07-18 DIAGNOSIS — E785 Hyperlipidemia, unspecified: Secondary | ICD-10-CM | POA: Diagnosis not present

## 2022-07-21 DIAGNOSIS — I679 Cerebrovascular disease, unspecified: Secondary | ICD-10-CM | POA: Diagnosis not present

## 2022-07-21 DIAGNOSIS — F324 Major depressive disorder, single episode, in partial remission: Secondary | ICD-10-CM | POA: Diagnosis not present

## 2022-07-21 DIAGNOSIS — E11319 Type 2 diabetes mellitus with unspecified diabetic retinopathy without macular edema: Secondary | ICD-10-CM | POA: Diagnosis not present

## 2022-07-21 DIAGNOSIS — I700 Atherosclerosis of aorta: Secondary | ICD-10-CM | POA: Diagnosis not present

## 2022-07-21 DIAGNOSIS — Z0000 Encounter for general adult medical examination without abnormal findings: Secondary | ICD-10-CM | POA: Diagnosis not present

## 2022-07-21 DIAGNOSIS — G4700 Insomnia, unspecified: Secondary | ICD-10-CM | POA: Diagnosis not present

## 2022-07-21 DIAGNOSIS — E785 Hyperlipidemia, unspecified: Secondary | ICD-10-CM | POA: Diagnosis not present

## 2022-07-21 DIAGNOSIS — Z6833 Body mass index (BMI) 33.0-33.9, adult: Secondary | ICD-10-CM | POA: Diagnosis not present

## 2022-07-21 DIAGNOSIS — I10 Essential (primary) hypertension: Secondary | ICD-10-CM | POA: Diagnosis not present

## 2022-07-21 DIAGNOSIS — N400 Enlarged prostate without lower urinary tract symptoms: Secondary | ICD-10-CM | POA: Diagnosis not present

## 2022-08-07 DIAGNOSIS — I639 Cerebral infarction, unspecified: Secondary | ICD-10-CM | POA: Diagnosis not present

## 2022-08-07 DIAGNOSIS — Z4509 Encounter for adjustment and management of other cardiac device: Secondary | ICD-10-CM | POA: Diagnosis not present

## 2022-08-07 DIAGNOSIS — Z95818 Presence of other cardiac implants and grafts: Secondary | ICD-10-CM | POA: Diagnosis not present

## 2022-09-05 ENCOUNTER — Ambulatory Visit: Payer: Medicare Other | Admitting: Internal Medicine

## 2022-09-05 ENCOUNTER — Ambulatory Visit: Payer: Medicare Other | Admitting: Cardiology

## 2022-09-05 ENCOUNTER — Encounter: Payer: Self-pay | Admitting: Internal Medicine

## 2022-09-05 VITALS — BP 145/85 | HR 82 | Ht 68.0 in | Wt 217.0 lb

## 2022-09-05 DIAGNOSIS — I10 Essential (primary) hypertension: Secondary | ICD-10-CM

## 2022-09-05 DIAGNOSIS — I350 Nonrheumatic aortic (valve) stenosis: Secondary | ICD-10-CM

## 2022-09-05 DIAGNOSIS — I639 Cerebral infarction, unspecified: Secondary | ICD-10-CM | POA: Diagnosis not present

## 2022-09-05 DIAGNOSIS — E7800 Pure hypercholesterolemia, unspecified: Secondary | ICD-10-CM

## 2022-09-05 DIAGNOSIS — R011 Cardiac murmur, unspecified: Secondary | ICD-10-CM

## 2022-09-05 NOTE — Progress Notes (Deleted)
Primary Physician/Referring:  [name], MD  Patient ID: Vincent Alvarez, male    DOB: 1940-05-05, 82 y.o.   MRN: [id number]  No chief complaint on file.  HPI:    UEL FRUIT  is a 82 y.o.  male with history of diabetes, hypertension, hyperlipidemia, stage IIIa CKD, hearing loss, gout and depression. Patient with history of recurrent TIAs and cryptogenic stroke, first in 2017 and most recently November 2022 S/P loop recorder implantation on 03/30/2021.  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  Denies chest pain, palpitations, syncope, near syncope.  Denies orthopnea, PND, leg edema.  Patient denies symptoms of claudication.  Patient is accompanied by his wife at today's office visit.  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   Depression    Diabetes mellitus without complication (HCC)    Diabetic retinopathy (HCC)    Gout    Hearing loss    Hyperlipidemia    Hypertension    Loop recorder Bi[employer]itor III 03/30/2021 03/30/2021   Onychomycosis    UTI (lower urinary tract infection)    Past Surgical History:  Procedure Laterality Date   CATARACT EXTRACTION, BILATERAL     CHOLECYSTECTOMY     COLONOSCOPY     TONSILLECTOMY     Family History  Problem Relation Age of Onset   CAD Mother 35   Colon cancer Father 39   Stroke Brother 27       4 strokes   Lung cancer Brother     Social History   Tobacco Use   Smoking status: Former    Types: Cigarettes    Quit date: 06/18/1985    Years since quitting: 37.2   Smokeless tobacco: Never  Substance Use Topics   Alcohol use: Yes    Alcohol/week: 2.0 standard drinks of alcohol    Types: 2 Glasses of wine per week    Comment: thinks he may be drinking too much, 2/4 + CAGE questions   Marital Status: Married   ROS  Review of Systems  Constitutional: Negative for malaise/fatigue and weight  gain.  Cardiovascular:  Negative for chest pain, claudication, leg swelling, near-syncope, orthopnea, palpitations, paroxysmal nocturnal dyspnea and syncope.  Respiratory:  Negative for shortness of breath.   Neurological:  Negative for dizziness.    Objective  There were no vitals taken for this visit.     04/12/2022    2:19 PM 04/12/2022    2:12 PM 01/04/2022    2:43 PM  Vitals with BMI  Height  5\' 8"  5\' 8"   Weight  216 lbs 6 oz 215 lbs  BMI  32.91 32.7  Systolic 148 151 235  Diastolic 78 81 58  Pulse 80 77 77      Physical Exam Vitals reviewed.  Neck:     Vascular: No carotid bruit or JVD.  Cardiovascular:     Rate and Rhythm: Normal rate and regular rhythm.     Pulses: Intact distal pulses.     Heart sounds: S1 normal and S2 normal. Murmur heard.     Systolic murmur is present. Soft systolic ejection murmur     No gallop.  Pulmonary:     Effort: Pulmonary effort is normal. No respiratory distress.     Breath sounds: No wheezing, rhonchi or rales.  Musculoskeletal:     Right lower leg: Edema (trace ankle) present.  Left lower leg: Edema (trace ankle) present.  Neurological:     Mental Status: He is alert.     Laboratory examination:   External labs:   Cholesterol, total 88.000 mg 07/18/2022 HDL 40.000 mg 07/18/2022 LDL 30.000 mg 07/18/2022 Triglycerides 87.000 mg 07/18/2022  A1C 7.900 mg/ 07/18/2022  Hemoglobin 13.300 g/d 02/23/2021  Creatinine, Serum 1.320 mg/ 07/18/2022 CrCl Est 40.69 07/18/2022 eGFR 54.000 calc 07/18/2022 Potassium 4.800 mm 07/18/2022 ALT (SGPT) 25.000 U/L 07/18/2022  Radiology:   CTA head and neck 02/19/2021: 1. Negative for large vessel occlusion. 2. Generally mild for age atherosclerosis in the head and neck. Calcified plaque most pronounced at the left ICA bulb, but no significant arterial stenosis. 3. Stable CT appearance of the brain since yesterday. Punctate left occipital infarct remains occult by CT.  Brain MRI 02/18/2021: 1. Punctate  focus of acute ischemia in the left occipital lobe. No hemorrhage or mass effect. 2. Moderate chronic small vessel ischemic disease and volume loss.  Cardiac Studies:   Echocardiogram 11/24/2020: 1. Left ventricular ejection fraction, by estimation, is 65 to 70%. The  left ventricle has normal function. The left ventricle has no regional wall motion abnormalities. There is mild left ventricular hypertrophy.  Left ventricular diastolic parameters are consistent with Grade I diastolic dysfunction (impaired relaxation). Elevated left ventricular end-diastolic pressure. The E/e' is 18.   2. Right ventricular systolic function is normal. The right ventricular  size is normal. Tricuspid regurgitation signal is inadequate for assessing PA pressure.   3. The mitral valve is abnormal. Trivial mitral valve regurgitation.   4. The aortic valve is tricuspid. Aortic valve regurgitation is not visualized. Mild to moderate aortic valve sclerosis/calcification is present, without any evidence of aortic stenosis.   5. The inferior vena cava is normal in size with greater than 50%  respiratory variability, suggesting right atrial pressure of 3 mmHg.  Loop recorder Bi[employer] Biomon[employer] III 03/30/2021   Remote loop recorder transmission 04/22//2024: Normal sinus rhythm, no further brief 4 seconds of AT/AF noted on 03/23/22. No symptoms reported. Unscheduled (Alert) 03/23/22: Brief AF seconds   EKG:   *** 03/23/2021: Normal sinus rhythm at rate of 68 bpm, left axis deviation, left anterior fascicle block.  Right bundle branch block.  Bifascicular block.  T wave abnormality, cannot exclude inferior ischemia.  Abnormal EKG. No significant change from 11/22/2020.   Allergies & Medications:   Allergies  Allergen Reactions   Shrimp [Shellfish Allergy] Nausea And Vomiting    Current Outpatient Medications:    acetaminophen (TYLENOL) 325 MG tablet, Take 650 mg by mouth every 6 (six) hours as needed for moderate pain or  headache., Disp: , Rfl:    amLODipine (NORVASC) 5 MG tablet, Take 5 mg by mouth every morning., Disp: , Rfl:    Artificial Tear Ointment (DRY EYES OP), Place 1 drop into both eyes daily as needed (dry eyes)., Disp: , Rfl:    atorvastatin (LIPITOR) 80 MG tablet, Take 1 tablet (80 mg total) by mouth every morning., Disp: 30 tablet, Rfl: 0   B Complex Vitamins (B COMPLEX PO), Take 1 tablet by mouth daily., Disp: , Rfl:    clopidogrel (PLAVIX) 75 MG tablet, Take 1 tablet (75 mg total) by mouth daily. Continue taking this medication, Disp: 30 tablet, Rfl: 1   FLUoxetine (PROZAC) 20 MG capsule, Take 20 mg by mouth every morning. , Disp: , Rfl:    fluticasone (FLONASE) 50 MCG/ACT nasal spray, Place 1 spray into both nostrils daily as needed for  allergies. , Disp: , Rfl:    losartan (COZAAR) 100 MG tablet, Take 100 mg by mouth daily., Disp: , Rfl:    metFORMIN (GLUCOPHAGE) 500 MG tablet, Take 1,000 mg by mouth 2 (two) times daily with a meal. , Disp: , Rfl:    Multiple Vitamins-Minerals (PRESERVISION AREDS 2+MULTI VIT) CAPS, Take 1 capsule by mouth in the morning and at bedtime., Disp: , Rfl:    sitaGLIPtin (JANUVIA) 100 MG tablet, Take 100 mg by mouth daily., Disp: , Rfl:    tamsulosin (FLOMAX) 0.4 MG CAPS capsule, Take 0.4 mg by mouth daily after supper. , Disp: , Rfl:    traZODone (DESYREL) 150 MG tablet, Take 50 mg by mouth at bedtime., Disp: , Rfl:    Assessment   No diagnosis found.    There are no discontinued medications.   No orders of the defined types were placed in this encounter.   Recommendations:   VARIAN WIND is a 82 y.o. male with history of diabetes, hypertension, hyperlipidemia, stage IIIa CKD, hearing loss, gout and depression. Patient with history of recurrent TIAs and cryptogenic stroke, first in 2017 and most recently November 2022 S/P loop recorder implantation on 03/30/2021.  ***   Yates Decamp, MD, [facility] 09/05/2022, 7:29 AM Office: [phone] Fax:  [phone] Pager: [phone]

## 2022-09-05 NOTE — Progress Notes (Signed)
Primary Physician/Referring:  [name], MD  Patient ID: Vincent Alvarez, male    DOB: 07-17-1940, 82 y.o.   MRN: [id number]  Chief Complaint  Patient presents with   Heart Murmur   HPI:    Vincent Alvarez  is a 82 y.o. male with history of diabetes, hypertension, hyperlipidemia, hearing loss, gout and depression. Patient with history of recurrent TIAs and cryptogenic stroke, first in 2017 and most recently November 2022.  Patient follows with Dr. Pearlean Brownie and neurology and is presently on Plavix.  Patient's work-up 02/2021 revealed no significant extra cerebral vascular disease, stroke was felt to be cryptogenic/embolic.    Patient is here for a follow-up visit. His PCP wanted him to be evaluated by cardiology because he believes his heart murmur has worsened. Patient does not have any symptoms of severe aortic stenosis. Patient is still able to do all of his activities without issue. He does not ever feel light-headed or like he is going to pass out. He feels like he is doing well. Denies chest pain, shortness of breath, palpitations, diaphoresis, syncope, edema, PND, orthopnea.     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   Depression    Diabetes mellitus without complication (HCC)    Diabetic retinopathy (HCC)    Gout    Hearing loss    Hyperlipidemia    Hypertension    Loop recorder Bi[employer]ik Biomonitor III 03/30/2021 03/30/2021   Onychomycosis    UTI (lower urinary tract infection)    Past Surgical History:  Procedure Laterality Date   CATARACT EXTRACTION, BILATERAL     CHOLECYSTECTOMY     COLONOSCOPY     TONSILLECTOMY     Family History  Problem Relation Age of Onset   CAD Mother 8   Colon cancer Father 75   Stroke Brother 8       4 strokes   Lung cancer Brother     Social History   Tobacco Use   Smoking status: Former    Types: Cigarettes    Quit date: 06/18/1985    Years since quitting:  37.2   Smokeless tobacco: Never  Substance Use Topics   Alcohol use: Yes    Alcohol/week: 2.0 standard drinks of alcohol    Types: 2 Glasses of wine per week    Comment: thinks he may be drinking too much, 2/4 + CAGE questions   Marital Status: Married   ROS  Review of Systems  Constitutional: Negative for malaise/fatigue and weight gain.  Cardiovascular:  Negative for chest pain, claudication, leg swelling, near-syncope, orthopnea, palpitations, paroxysmal nocturnal dyspnea and syncope.  Respiratory:  Negative for shortness of breath.   Neurological:  Negative for dizziness.    Objective  Blood pressure (!) 145/85, pulse 82, height 5\' 8"  (1.727 m), weight 217 lb (98.4 kg), SpO2 96 %.     09/05/2022   10:52 AM 04/12/2022    2:19 PM 04/12/2022    2:12 PM  Vitals with BMI  Height 5\' 8"   5\' 8"   Weight 217 lbs  216 lbs 6 oz  BMI 33  32.91  Systolic 145 148 578  Diastolic 85 78 81  Pulse 82 80 77      Physical Exam Vitals reviewed.  Neck:     Vascular: No carotid bruit or JVD.  Cardiovascular:     Rate and Rhythm: Normal rate and regular rhythm.     Pulses:  Intact distal pulses.     Heart sounds: S1 normal and S2 normal. Murmur heard.     Systolic murmur is present. Soft systolic ejection murmur     No gallop.  Pulmonary:     Effort: Pulmonary effort is normal. No respiratory distress.     Breath sounds: No wheezing, rhonchi or rales.  Musculoskeletal:     Right lower leg: Edema (trace ankle) present.     Left lower leg: Edema (trace ankle) present.  Neurological:     Mental Status: He is alert.     Laboratory examination:   No results for input(s): "NA", "K", "CL", "CO2", "GLUCOSE", "BUN", "CREATININE", "CALCIUM", "GFRNONAA", "GFRAA" in the last 8760 hours.  CrCl cannot be calculated (Patient's most recent lab result is older than the maximum 21 days allowed.).     Latest Ref Rng & Units 02/18/2021    9:41 PM 02/18/2021    8:39 PM 11/24/2020    8:00 AM  CMP   Glucose 70 - 99 mg/dL 161  096  045   BUN 8 - 23 mg/dL 12  10  16    Creatinine 0.61 - 1.24 mg/dL 4.09  8.11  9.14   Sodium 135 - 145 mmol/L 135  135  137   Potassium 3.5 - 5.1 mmol/L 4.0  4.2  4.0   Chloride 98 - 111 mmol/L 96  98  103   CO2 22 - 32 mmol/L  27  28   Calcium 8.9 - 10.3 mg/dL  9.8  9.2   Total Protein 6.5 - 8.1 g/dL  6.8    Total Bilirubin 0.3 - 1.2 mg/dL  0.6    Alkaline Phos 38 - 126 U/L  68    AST 15 - 41 U/L  16    ALT 0 - 44 U/L  15        Latest Ref Rng & Units 02/18/2021    9:41 PM 02/18/2021    8:39 PM 11/23/2020    3:21 PM  CBC  WBC 4.0 - 10.5 K/uL  6.9  6.5   Hemoglobin 13.0 - 17.0 g/dL 78.2  95.6  21.3   Hematocrit 39.0 - 52.0 % 39.0  39.0  39.5   Platelets 150 - 400 K/uL  278  257    Lipid Panel No results for input(s): "CHOL", "TRIG", "LDLCALC", "VLDL", "HDL", "CHOLHDL", "LDLDIRECT" in the last 8760 hours.  HEMOGLOBIN A1C Lab Results  Component Value Date   HGBA1C 6.7 (H) 02/19/2021   MPG 145.59 02/19/2021   TSH No results for input(s): "TSH" in the last 8760 hours.  External labs:   None   Allergies   Allergies  Allergen Reactions   Shrimp [Shellfish Allergy] Nausea And Vomiting    Medications Prior to Visit:   Outpatient Medications Prior to Visit  Medication Sig Dispense Refill   acetaminophen (TYLENOL) 325 MG tablet Take 650 mg by mouth every 6 (six) hours as needed for moderate pain or headache.     amLODipine (NORVASC) 5 MG tablet Take 5 mg by mouth every morning.     Artificial Tear Ointment (DRY EYES OP) Place 1 drop into both eyes daily as needed (dry eyes).     atorvastatin (LIPITOR) 80 MG tablet Take 1 tablet (80 mg total) by mouth every morning. 30 tablet 0   B Complex Vitamins (B COMPLEX PO) Take 1 tablet by mouth daily.     clopidogrel (PLAVIX) 75 MG tablet Take 1 tablet (75 mg total) by mouth  daily. Continue taking this medication 30 tablet 1   FLUoxetine (PROZAC) 20 MG capsule Take 20 mg by mouth every morning.       fluticasone (FLONASE) 50 MCG/ACT nasal spray Place 1 spray into both nostrils daily as needed for allergies.      losartan (COZAAR) 100 MG tablet Take 100 mg by mouth daily.     metFORMIN (GLUCOPHAGE) 500 MG tablet Take 1,000 mg by mouth 2 (two) times daily with a meal.      Multiple Vitamins-Minerals (PRESERVISION AREDS 2+MULTI VIT) CAPS Take 1 capsule by mouth in the morning and at bedtime.     sitaGLIPtin (JANUVIA) 100 MG tablet Take 100 mg by mouth daily.     tamsulosin (FLOMAX) 0.4 MG CAPS capsule Take 0.4 mg by mouth daily after supper.      traZODone (DESYREL) 150 MG tablet Take 50 mg by mouth at bedtime.     No facility-administered medications prior to visit.   Final Medications at End of Visit    Current Meds  Medication Sig   acetaminophen (TYLENOL) 325 MG tablet Take 650 mg by mouth every 6 (six) hours as needed for moderate pain or headache.   amLODipine (NORVASC) 5 MG tablet Take 5 mg by mouth every morning.   Artificial Tear Ointment (DRY EYES OP) Place 1 drop into both eyes daily as needed (dry eyes).   atorvastatin (LIPITOR) 80 MG tablet Take 1 tablet (80 mg total) by mouth every morning.   B Complex Vitamins (B COMPLEX PO) Take 1 tablet by mouth daily.   clopidogrel (PLAVIX) 75 MG tablet Take 1 tablet (75 mg total) by mouth daily. Continue taking this medication   FLUoxetine (PROZAC) 20 MG capsule Take 20 mg by mouth every morning.    fluticasone (FLONASE) 50 MCG/ACT nasal spray Place 1 spray into both nostrils daily as needed for allergies.    losartan (COZAAR) 100 MG tablet Take 100 mg by mouth daily.   metFORMIN (GLUCOPHAGE) 500 MG tablet Take 1,000 mg by mouth 2 (two) times daily with a meal.    Multiple Vitamins-Minerals (PRESERVISION AREDS 2+MULTI VIT) CAPS Take 1 capsule by mouth in the morning and at bedtime.   sitaGLIPtin (JANUVIA) 100 MG tablet Take 100 mg by mouth daily.   tamsulosin (FLOMAX) 0.4 MG CAPS capsule Take 0.4 mg by mouth daily after supper.     traZODone (DESYREL) 150 MG tablet Take 50 mg by mouth at bedtime.   Radiology:   No results found. CTA head and neck 02/19/2021: 1. Negative for large vessel occlusion. 2. Generally mild for age atherosclerosis in the head and neck. Calcified plaque most pronounced at the left ICA bulb, but no significant arterial stenosis. 3. Stable CT appearance of the brain since yesterday. Punctate left occipital infarct remains occult by CT.  Brain MRI 02/18/2021: 1. Punctate focus of acute ischemia in the left occipital lobe. No hemorrhage or mass effect. 2. Moderate chronic small vessel ischemic disease and volume loss.  Cardiac Studies:   Echocardiogram 11/24/2020: 1. Left ventricular ejection fraction, by estimation, is 65 to 70%. The  left ventricle has normal function. The left ventricle has no regional wall motion abnormalities. There is mild left ventricular hypertrophy.  Left ventricular diastolic parameters are consistent with Grade I diastolic dysfunction (impaired relaxation). Elevated left ventricular end-diastolic pressure. The E/e' is 18.   2. Right ventricular systolic function is normal. The right ventricular  size is normal. Tricuspid regurgitation signal is inadequate for assessing PA pressure.  3. The mitral valve is abnormal. Trivial mitral valve regurgitation.   4. The aortic valve is tricuspid. Aortic valve regurgitation is not visualized. Mild to moderate aortic valve sclerosis/calcification is present, without any evidence of aortic stenosis.   5. The inferior vena cava is normal in size with greater than 50%  respiratory variability, suggesting right atrial pressure of 3 mmHg.  EKG:   03/23/2021: Normal sinus rhythm at rate of 68 bpm, left axis deviation, left anterior fascicle block.  Right bundle branch block.  Bifascicular block.  T wave abnormality, cannot exclude inferior ischemia.  Abnormal EKG. No significant change from 11/22/2020.  Assessment     ICD-10-CM   1. Murmur   R01.1 EKG 12-Lead    PCV ECHOCARDIOGRAM COMPLETE    2. 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 Cryptogenic stroke (HCC)  I63.9     4. Essential hypertension  I10     5. Hypercholesterolemia  E78.00        There are no discontinued medications.   No orders of the defined types were placed in this encounter.   Recommendations:   Vincent Alvarez is a 82 y.o. male with history of diabetes, hypertension, hyperlipidemia, hearing loss, gout and depression. Patient with history of recurrent TIAs and cryptogenic stroke, first in 2017 and most recently November 2022.  Patient follows with Dr. Pearlean Brownie and neurology and is presently on Plavix.  Patient's recent work-up 02/2021 revealed no significant extra cerebral vascular disease, stroke was felt to be cryptogenic/embolic.    Murmur Nonrheumatic aortic valve stenosis PCP noted patient's murmur to be more prominent than usual It does not sound severe at this point nor does he complain of any symptoms associated with severe AS I will repeat an echocardiogram to monitor the progression of his AS  Cryptogenic stroke (HCC) Continue plavix Loop recorder is in place  Primary hypertension Continue current cardiac medications. Encourage low-sodium diet, less than 2000 mg daily. Patient states BP is <130/80 outside of doctors office  Hypercholesterolemia Continue statin    Clotilde Dieter, DO, [facility] 09/06/2022, 2:01 PM Office: [phone]

## 2022-09-07 DIAGNOSIS — I639 Cerebral infarction, unspecified: Secondary | ICD-10-CM | POA: Diagnosis not present

## 2022-09-07 DIAGNOSIS — Z95818 Presence of other cardiac implants and grafts: Secondary | ICD-10-CM | POA: Diagnosis not present

## 2022-09-07 DIAGNOSIS — Z4509 Encounter for adjustment and management of other cardiac device: Secondary | ICD-10-CM | POA: Diagnosis not present

## 2022-09-18 DIAGNOSIS — M1712 Unilateral primary osteoarthritis, left knee: Secondary | ICD-10-CM | POA: Diagnosis not present

## 2022-10-03 ENCOUNTER — Ambulatory Visit: Payer: Medicare Other

## 2022-10-03 DIAGNOSIS — R011 Cardiac murmur, unspecified: Secondary | ICD-10-CM

## 2022-10-03 DIAGNOSIS — I10 Essential (primary) hypertension: Secondary | ICD-10-CM | POA: Diagnosis not present

## 2022-10-08 DIAGNOSIS — I639 Cerebral infarction, unspecified: Secondary | ICD-10-CM | POA: Diagnosis not present

## 2022-10-08 DIAGNOSIS — Z95818 Presence of other cardiac implants and grafts: Secondary | ICD-10-CM | POA: Diagnosis not present

## 2022-10-08 DIAGNOSIS — Z4509 Encounter for adjustment and management of other cardiac device: Secondary | ICD-10-CM | POA: Diagnosis not present

## 2022-10-14 ENCOUNTER — Emergency Department (HOSPITAL_BASED_OUTPATIENT_CLINIC_OR_DEPARTMENT_OTHER): Payer: Medicare Other

## 2022-10-14 ENCOUNTER — Other Ambulatory Visit: Payer: Self-pay

## 2022-10-14 ENCOUNTER — Encounter (HOSPITAL_BASED_OUTPATIENT_CLINIC_OR_DEPARTMENT_OTHER): Payer: Self-pay | Admitting: Emergency Medicine

## 2022-10-14 ENCOUNTER — Emergency Department (HOSPITAL_BASED_OUTPATIENT_CLINIC_OR_DEPARTMENT_OTHER)
Admission: EM | Admit: 2022-10-14 | Discharge: 2022-10-14 | Disposition: A | Discharge status: Home / Self Care | Payer: Medicare Other | Attending: Emergency Medicine | Admitting: Emergency Medicine

## 2022-10-14 DIAGNOSIS — Z043 Encounter for examination and observation following other accident: Secondary | ICD-10-CM | POA: Diagnosis not present

## 2022-10-14 DIAGNOSIS — M7732 Calcaneal spur, left foot: Secondary | ICD-10-CM | POA: Diagnosis not present

## 2022-10-14 DIAGNOSIS — S161XXA Strain of muscle, fascia and tendon at neck level, initial encounter: Secondary | ICD-10-CM | POA: Diagnosis not present

## 2022-10-14 DIAGNOSIS — S41112A Laceration without foreign body of left upper arm, initial encounter: Secondary | ICD-10-CM | POA: Insufficient documentation

## 2022-10-14 DIAGNOSIS — S8265XA Nondisplaced fracture of lateral malleolus of left fibula, initial encounter for closed fracture: Secondary | ICD-10-CM | POA: Insufficient documentation

## 2022-10-14 DIAGNOSIS — S0990XA Unspecified injury of head, initial encounter: Secondary | ICD-10-CM | POA: Insufficient documentation

## 2022-10-14 DIAGNOSIS — G319 Degenerative disease of nervous system, unspecified: Secondary | ICD-10-CM | POA: Diagnosis not present

## 2022-10-14 DIAGNOSIS — W11XXXA Fall on and from ladder, initial encounter: Secondary | ICD-10-CM | POA: Diagnosis not present

## 2022-10-14 DIAGNOSIS — E119 Type 2 diabetes mellitus without complications: Secondary | ICD-10-CM | POA: Diagnosis not present

## 2022-10-14 DIAGNOSIS — Z23 Encounter for immunization: Secondary | ICD-10-CM | POA: Insufficient documentation

## 2022-10-14 DIAGNOSIS — W19XXXA Unspecified fall, initial encounter: Secondary | ICD-10-CM

## 2022-10-14 DIAGNOSIS — I10 Essential (primary) hypertension: Secondary | ICD-10-CM | POA: Insufficient documentation

## 2022-10-14 DIAGNOSIS — M19012 Primary osteoarthritis, left shoulder: Secondary | ICD-10-CM | POA: Diagnosis not present

## 2022-10-14 DIAGNOSIS — Z79899 Other long term (current) drug therapy: Secondary | ICD-10-CM | POA: Diagnosis not present

## 2022-10-14 DIAGNOSIS — S199XXA Unspecified injury of neck, initial encounter: Secondary | ICD-10-CM | POA: Diagnosis not present

## 2022-10-14 DIAGNOSIS — S99912A Unspecified injury of left ankle, initial encounter: Secondary | ICD-10-CM | POA: Diagnosis present

## 2022-10-14 DIAGNOSIS — Z7984 Long term (current) use of oral hypoglycemic drugs: Secondary | ICD-10-CM | POA: Insufficient documentation

## 2022-10-14 DIAGNOSIS — I6782 Cerebral ischemia: Secondary | ICD-10-CM | POA: Diagnosis not present

## 2022-10-14 MED ORDER — LIDOCAINE-EPINEPHRINE (PF) 2 %-1:200000 IJ SOLN
20.0000 mL | Freq: Once | INTRAMUSCULAR | Status: AC
  Administered 2022-10-14: 20 mL
  Filled 2022-10-14: qty 20

## 2022-10-14 MED ORDER — TETANUS-DIPHTH-ACELL PERTUSSIS 5-2.5-18.5 LF-MCG/0.5 IM SUSY
0.5000 mL | PREFILLED_SYRINGE | Freq: Once | INTRAMUSCULAR | Status: AC
  Administered 2022-10-14: 0.5 mL via INTRAMUSCULAR
  Filled 2022-10-14: qty 0.5

## 2022-10-14 MED ORDER — OXYCODONE HCL 5 MG PO TABS: 5.0000 mg | ORAL_TABLET | Freq: Four times a day (QID) | ORAL | 0 refills | Status: AC | PRN

## 2022-10-14 NOTE — Discharge Instructions (Addendum)
Thank you for letting us take care of you today.  Your ankle x-ray is concerning for a bimalleolar ankle fracture. We discussed this with orthopedics and they recommended placing you in splint and seeing them in office for follow up. Please call Dr. Marvia Pickles office on Monday to schedule a follow-up appointment.  Continue to use the splint and crutches until you are able to see them in the office.  I sent in a small amount of pain medication for home for you to take as needed.  Please only take this as a last resort if over-the-counter medications are not effective and you are still having severe pain.  Your head CT and neck CT did not show any injuries from your fall today.  The x-ray of your arm was also normal.  We placed 5 sutures in the cut to your arm that will need to come out in 7 to 10 days.  These may be removed at your primary care, urgent care, or in the ED.  If you notice any redness around the wound, drainage from the wound, fever, or other new, concerning symptoms, return to the nearest ED for reevaluation.

## 2022-10-14 NOTE — ED Provider Notes (Signed)
[facility] AT [facility] Provider Note   CSN: [id number] Arrival date & time: 10/14/22  1533     History  Chief Complaint  Patient presents with   Vincent Alvarez is a 82 y.o. male with PMH DM, HTN, HLD who presents to the ED complaining of a fall off a ladder.  He states that he was approximately 5 feet up when the ladder slipped in mud and caused him to fall down.  He notes that when he landed he hit his head and sustained a laceration to his left upper arm  He does take Plavix.  No other anticoagulants.  He denies severe bleeding at the scene.  He denies LOC, nausea, vomiting.  He is complaining of left ankle pain.  He has been able to ambulate but does note that pain worsens with ambulation.  Also complains of minor neck pain.  States that he took 2 Tylenol before arrival with good pain control.    Home Medications Prior to Admission medications   Medication Sig Start Date End Date Taking? Authorizing Provider  oxyCODONE (ROXICODONE) 5 MG immediate release tablet Take 1 tablet (5 mg total) by mouth every 6 (six) hours as needed for up to 3 days for severe pain or breakthrough pain. 10/14/22 10/17/22 Yes Nyasia Baxley L, PA-C  acetaminophen (TYLENOL) 325 MG tablet Take 650 mg by mouth every 6 (six) hours as needed for moderate pain or headache.    [provider]  amLODipine (NORVASC) 5 MG tablet Take 5 mg by mouth every morning. 12/05/15   [provider]  Artificial Tear Ointment (DRY EYES OP) Place 1 drop into both eyes daily as needed (dry eyes).    [provider]  atorvastatin (LIPITOR) 80 MG tablet Take 1 tablet (80 mg total) by mouth every morning. 02/21/21 09/05/22  Arrien, York Ram, MD  B Complex Vitamins (B COMPLEX PO) Take 1 tablet by mouth daily.    [provider]  clopidogrel (PLAVIX) 75 MG tablet Take 1 tablet (75 mg total) by mouth daily. Continue taking this medication 11/25/20   Burnadette Pop, MD  FLUoxetine (PROZAC) 20 MG capsule Take 20 mg by mouth every morning.     [provider]  fluticasone (FLONASE) 50 MCG/ACT nasal spray Place 1 spray into both nostrils daily as needed for allergies.  09/14/15   [provider]  losartan (COZAAR) 100 MG tablet Take 100 mg by mouth daily. 10/30/20   [provider]  metFORMIN (GLUCOPHAGE) 500 MG tablet Take 1,000 mg by mouth 2 (two) times daily with a meal.     [provider]  Multiple Vitamins-Minerals (PRESERVISION AREDS 2+MULTI VIT) CAPS Take 1 capsule by mouth in the morning and at bedtime.    [provider]  sitaGLIPtin (JANUVIA) 100 MG tablet Take 100 mg by mouth daily.    [provider]  tamsulosin (FLOMAX) 0.4 MG CAPS capsule Take 0.4 mg by mouth daily after supper.     [provider]  traZODone (DESYREL) 150 MG tablet Take 50 mg by mouth at bedtime.    [provider]      Allergies    Shrimp [shellfish allergy]    Review of Systems   Review of Systems  All other systems reviewed and are negative.   Physical Exam Updated Vital Signs BP (!) 144/74 (BP Location: Right Arm)   Pulse 72   Temp 98.5 F (36.9 C)  Resp 16   SpO2 99%  Physical Exam Vitals and nursing note reviewed.  Constitutional:      General: He is not in acute distress.    Appearance: Normal appearance. He is not ill-appearing or toxic-appearing.  HENT:     Head: Normocephalic and atraumatic.     Right Ear: External ear normal.     Left Ear: External ear normal.     Mouth/Throat:     Mouth: Mucous membranes are moist.  Eyes:     General: No scleral icterus.    Extraocular Movements: Extraocular movements intact.     Conjunctiva/sclera: Conjunctivae normal.  Neck:     Comments: No midline tenderness, step-offs, or deformities Cardiovascular:     Rate and Rhythm: Normal rate and regular rhythm.     Heart sounds: No murmur heard. Pulmonary:     Effort: Pulmonary effort  is normal.     Breath sounds: Normal breath sounds.  Abdominal:     General: Abdomen is flat.     Palpations: Abdomen is soft.     Tenderness: There is no abdominal tenderness. There is no guarding or rebound.  Musculoskeletal:     Cervical back: Normal range of motion and neck supple. No rigidity.     Right lower leg: No edema.     Left lower leg: No edema.     Comments: No midline CTL spinal tenderness, step-offs or deformities, and is able to change positions in bed with ease, pelvis stable and nontender, tenderness over the lateral and medial malleolus of the left ankle greatest to the lateral aspect with overlying ecchymosis, no significant open wounds, neurovascularly intact distally, 2+ DP and PT pulses with normal sensation, no tenderness over any aspect of the foot, remainder of lower extremities bilaterally palpated and nontender without obvious deformity or overlying skin changes or wounds, patient has a ~4cm curvilinear flap like laceration to the L upper extremity just proximal to the elbow, minimal active bleeding  Skin:    General: Skin is warm and dry.     Capillary Refill: Capillary refill takes less than 2 seconds.  Neurological:     General: No focal deficit present.     Mental Status: He is alert and oriented to person, place, and time.     GCS: GCS eye subscore is 4. GCS verbal subscore is 5. GCS motor subscore is 6.     Cranial Nerves: Cranial nerves 2-12 are intact.     Sensory: Sensation is intact.     Motor: Motor function is intact.  Psychiatric:        Mood and Affect: Mood normal.        Behavior: Behavior normal.     ED Results / Procedures / Treatments   Labs (all labs ordered are listed, but only abnormal results are displayed) Labs Reviewed - No data to display  EKG None  Radiology DG Humerus Left  Result Date: 10/14/2022 CLINICAL DATA:  Fall EXAM: LEFT HUMERUS - 2+ VIEW COMPARISON:  None Available. FINDINGS: Moderate AC joint degenerative change.  No acute fracture or malalignment. Curvilinear density overlying the proximal humerus shaft, question artifact or small foreign body. IMPRESSION: No acute osseous abnormality Electronically Signed   By: Jasmine Pang M.D.   On: 10/14/2022 17:44   DG Ankle Complete Left  Result Date: 10/14/2022 CLINICAL DATA:  Larey Seat off ladder EXAM: LEFT ANKLE COMPLETE - 3+ VIEW COMPARISON:  None Available. FINDINGS: Vascular calcifications. Moderate plantar calcaneal spur. No dislocation. Corticated osseous  densities adjacent to the medial malleolus consistent with remote injury or ossicles. Age indeterminate possible cortical fracture fragments slightly more superior at the medial malleolus. Punctate calcifications versus small avulsion fracture fragment between the lateral malleolus and the lower lateral talus IMPRESSION: 1. Age indeterminate possible cortical fracture fragments at the medial malleolus. 2. Punctate calcifications versus small avulsion fracture fragment between the lateral malleolus and lower lateral talus. Electronically Signed   By: Jasmine Pang M.D.   On: 10/14/2022 17:44   CT Head Wo Contrast  Result Date: 10/14/2022 CLINICAL DATA:  Trauma EXAM: CT HEAD WITHOUT CONTRAST CT CERVICAL SPINE WITHOUT CONTRAST TECHNIQUE: Multidetector CT imaging of the head and cervical spine was performed following the standard protocol without intravenous contrast. Multiplanar CT image reconstructions of the cervical spine were also generated. RADIATION DOSE REDUCTION: This exam was performed according to the departmental dose-optimization program which includes automated exposure control, adjustment of the mA and/or kV according to patient size and/or use of iterative reconstruction technique. COMPARISON:  CT brain 02/19/2021 FINDINGS: CT HEAD FINDINGS Brain: No acute territorial infarction, hemorrhage, or intracranial mass. Atrophy. Moderate white matter hypodensity consistent with chronic small vessel ischemic change.  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 Stable ventricle size. Vascular: No hyperdense vessels. Vertebral and carotid vascular calcification Skull: Normal. Negative for fracture or focal lesion. Sinuses/Orbits: No acute finding. Other: None CT CERVICAL SPINE FINDINGS Alignment: No subluxation.  Facet alignment within normal limits. Skull base and vertebrae: No acute fracture. No primary bone lesion or focal pathologic process. Soft tissues and spinal canal: No prevertebral fluid or swelling. No visible canal hematoma. Disc levels: Multilevel degenerative change. Moderate disc space narrowing C3 through C7. Facet degenerative changes at multiple levels with foraminal narrowing Upper chest: Negative. Other: None IMPRESSION: 1. No CT evidence for acute intracranial abnormality. Atrophy and chronic small vessel ischemic changes of the white matter. 2. Degenerative changes of the cervical spine. No acute osseous abnormality. Electronically Signed   By: Jasmine Pang M.D.   On: 10/14/2022 17:42   CT Cervical Spine Wo Contrast  Result Date: 10/14/2022 CLINICAL DATA:  Trauma EXAM: CT HEAD WITHOUT CONTRAST CT CERVICAL SPINE WITHOUT CONTRAST TECHNIQUE: Multidetector CT imaging of the head and cervical spine was performed following the standard protocol without intravenous contrast. Multiplanar CT image reconstructions of the cervical spine were also generated. RADIATION DOSE REDUCTION: This exam was performed according to the departmental dose-optimization program which includes automated exposure control, adjustment of the mA and/or kV according to patient size and/or use of iterative reconstruction technique. COMPARISON:  CT brain 02/19/2021 FINDINGS: CT HEAD FINDINGS Brain: No acute territorial infarction, hemorrhage, or intracranial mass. Atrophy. Moderate white matter hypodensity consistent with chronic small vessel ischemic change. Chronic lacunar infarcts in the left thalamus and basal  ganglia. Stable ventricle size. Vascular: No hyperdense vessels. Vertebral and carotid vascular calcification Skull: Normal. Negative for fracture or focal lesion. Sinuses/Orbits: No acute finding. Other: None CT CERVICAL SPINE FINDINGS Alignment: No subluxation.  Facet alignment within normal limits. Skull base and vertebrae: No acute fracture. No primary bone lesion or focal pathologic process. Soft tissues and spinal canal: No prevertebral fluid or swelling. No visible canal hematoma. Disc levels: Multilevel degenerative change. Moderate disc space narrowing C3 through C7. Facet degenerative changes at multiple levels with foraminal narrowing Upper chest: Negative. Other: None IMPRESSION: 1. No CT evidence for acute intracranial abnormality. Atrophy and chronic small vessel ischemic changes of the white matter. 2. Degenerative changes of the  cervical spine. No acute osseous abnormality. Electronically Signed   By: Jasmine Pang M.D.   On: 10/14/2022 17:42    Procedures .Marland KitchenLaceration Repair  Date/Time: 10/14/2022 6:20 PM  Performed by: Tonette Lederer, PA-C Authorized by: Tonette Lederer, PA-C   Consent:    Consent obtained:  Verbal   Consent given by:  Patient   Risks, benefits, and alternatives were discussed: yes     Risks discussed:  Infection, need for additional repair, pain and poor cosmetic result   Alternatives discussed:  No treatment, delayed treatment, observation and referral Universal protocol:    Procedure explained and questions answered to patient or proxy's satisfaction: yes     Immediately prior to procedure, a time out was called: yes     Patient identity confirmed:  Verbally with patient Anesthesia:    Anesthesia method:  Local infiltration   Local anesthetic:  Lidocaine 2% WITH epi Laceration details:    Location:  Shoulder/arm   Shoulder/arm location:  L upper arm   Length (cm):  4 Pre-procedure details:    Preparation:  Patient was prepped and draped in usual  sterile fashion and imaging obtained to evaluate for foreign bodies Exploration:    Hemostasis achieved with:  Epinephrine and direct pressure   Imaging obtained: x-ray     Imaging outcome: foreign body not noted     Wound exploration: wound explored through full range of motion and entire depth of wound visualized     Contaminated: no   Treatment:    Area cleansed with:  Povidone-iodine   Amount of cleaning:  Extensive   Irrigation solution:  Sterile saline   Irrigation method:  Syringe   Debridement:  None Skin repair:    Repair method:  Sutures   Suture size:  4-0   Suture material:  Prolene   Suture technique:  Simple interrupted   Number of sutures:  5 Approximation:    Approximation:  Close Repair type:    Repair type:  Intermediate Post-procedure details:    Dressing:  Non-adherent dressing and bulky dressing   Procedure completion:  Tolerated well, no immediate complications     Medications Ordered in ED Medications  Tdap (BOOSTRIX) injection 0.5 mL (0.5 mLs Intramuscular Given 10/14/22 1613)  lidocaine-EPINEPHrine (XYLOCAINE W/EPI) 2 %-1:200000 (PF) injection 20 mL (20 mLs Infiltration Given 10/14/22 1613)    ED Course/ Medical Decision Making/ A&P                             Medical Decision Making Amount and/or Complexity of Data Reviewed Radiology: ordered. Decision-making details documented in ED Course.  Risk Prescription drug management.   Medical Decision Making:   DEVANTAE SELLMAN is a 82 y.o. male who presented to the ED today with fall detailed above.    Patient's presentation is complicated by their history of advanced age, multiple comorbidities.  Complete initial physical exam performed, notably the patient was in no acute distress.  He had a laceration to the left upper arm with controlled bleeding.  He had tenderness over the left ankle worse to the lateral aspect, neurovascularly intact distally.    Reviewed and confirmed nursing  documentation for past medical history, family history, social history.    Initial Assessment:   With the patient's presentation, differential diagnosis includes but is not limited to fracture, dislocation, sprain, strain, contusion, simple vs complex laceration, ICH, cervical spine injury.  This is most consistent with  an acute complicated illness  Initial Plan:  CT brain and cervical spine to assess for traumatic injury L ankle and L humerus XR to assess for traumatic injury Tdap updated Laceration repair Objective evaluation as below reviewed   Initial Study Results:   Radiology:  All images reviewed independently. Agree with radiology report at this time.   DG Humerus Left  Result Date: 10/14/2022 CLINICAL DATA:  Fall EXAM: LEFT HUMERUS - 2+ VIEW COMPARISON:  None Available. FINDINGS: Moderate AC joint degenerative change. No acute fracture or malalignment. Curvilinear density overlying the proximal humerus shaft, question artifact or small foreign body. IMPRESSION: No acute osseous abnormality Electronically Signed   By: Jasmine Pang M.D.   On: 10/14/2022 17:44   DG Ankle Complete Left  Result Date: 10/14/2022 CLINICAL DATA:  Larey Seat off ladder EXAM: LEFT ANKLE COMPLETE - 3+ VIEW COMPARISON:  None Available. FINDINGS: Vascular calcifications. Moderate plantar calcaneal spur. No dislocation. Corticated osseous densities adjacent to the medial malleolus consistent with remote injury or ossicles. Age indeterminate possible cortical fracture fragments slightly more superior at the medial malleolus. Punctate calcifications versus small avulsion fracture fragment between the lateral malleolus and the lower lateral talus IMPRESSION: 1. Age indeterminate possible cortical fracture fragments at the medial malleolus. 2. Punctate calcifications versus small avulsion fracture fragment between the lateral malleolus and lower lateral talus. Electronically Signed   By: Jasmine Pang M.D.   On: 10/14/2022  17:44   CT Head Wo Contrast  Result Date: 10/14/2022 CLINICAL DATA:  Trauma EXAM: CT HEAD WITHOUT CONTRAST CT CERVICAL SPINE WITHOUT CONTRAST TECHNIQUE: Multidetector CT imaging of the head and cervical spine was performed following the standard protocol without intravenous contrast. Multiplanar CT image reconstructions of the cervical spine were also generated. RADIATION DOSE REDUCTION: This exam was performed according to the departmental dose-optimization program which includes automated exposure control, adjustment of the mA and/or kV according to patient size and/or use of iterative reconstruction technique. COMPARISON:  CT brain 02/19/2021 FINDINGS: CT HEAD FINDINGS Brain: No acute territorial infarction, hemorrhage, or intracranial mass. Atrophy. Moderate white matter hypodensity consistent with chronic small vessel ischemic change. 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 Stable ventricle size. Vascular: No hyperdense vessels. Vertebral and carotid vascular calcification Skull: Normal. Negative for fracture or focal lesion. Sinuses/Orbits: No acute finding. Other: None CT CERVICAL SPINE FINDINGS Alignment: No subluxation.  Facet alignment within normal limits. Skull base and vertebrae: No acute fracture. No primary bone lesion or focal pathologic process. Soft tissues and spinal canal: No prevertebral fluid or swelling. No visible canal hematoma. Disc levels: Multilevel degenerative change. Moderate disc space narrowing C3 through C7. Facet degenerative changes at multiple levels with foraminal narrowing Upper chest: Negative. Other: None IMPRESSION: 1. No CT evidence for acute intracranial abnormality. Atrophy and chronic small vessel ischemic changes of the white matter. 2. Degenerative changes of the cervical spine. No acute osseous abnormality. Electronically Signed   By: Jasmine Pang M.D.   On: 10/14/2022 17:42   CT Cervical Spine Wo Contrast  Result Date:  10/14/2022 CLINICAL DATA:  Trauma EXAM: CT HEAD WITHOUT CONTRAST CT CERVICAL SPINE WITHOUT CONTRAST TECHNIQUE: Multidetector CT imaging of the head and cervical spine was performed following the standard protocol without intravenous contrast. Multiplanar CT image reconstructions of the cervical spine were also generated. RADIATION DOSE REDUCTION: This exam was performed according to the departmental dose-optimization program which includes automated exposure control, adjustment of the mA and/or kV according to patient size and/or use of  iterative reconstruction technique. COMPARISON:  CT brain 02/19/2021 FINDINGS: CT HEAD FINDINGS Brain: No acute territorial infarction, hemorrhage, or intracranial mass. Atrophy. Moderate white matter hypodensity consistent with chronic small vessel ischemic change. 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 Stable ventricle size. Vascular: No hyperdense vessels. Vertebral and carotid vascular calcification Skull: Normal. Negative for fracture or focal lesion. Sinuses/Orbits: No acute finding. Other: None CT CERVICAL SPINE FINDINGS Alignment: No subluxation.  Facet alignment within normal limits. Skull base and vertebrae: No acute fracture. No primary bone lesion or focal pathologic process. Soft tissues and spinal canal: No prevertebral fluid or swelling. No visible canal hematoma. Disc levels: Multilevel degenerative change. Moderate disc space narrowing C3 through C7. Facet degenerative changes at multiple levels with foraminal narrowing Upper chest: Negative. Other: None IMPRESSION: 1. No CT evidence for acute intracranial abnormality. Atrophy and chronic small vessel ischemic changes of the white matter. 2. Degenerative changes of the cervical spine. No acute osseous abnormality. Electronically Signed   By: Jasmine Pang M.D.   On: 10/14/2022 17:42   PCV ECHOCARDIOGRAM COMPLETE  Result Date: 10/10/2022 Echocardiogram 10/03/2022: Left ventricle cavity is  normal in size. Moderate concentric hypertrophy of the left ventricle. Normal global wall motion. Normal LV systolic function with EF 65-70%. Doppler evidence of grade I (impaired) diastolic dysfunction, normal LAP. Left atrial cavity is moderately dilated at 44 ml/m^2. Trileaflet aortic valve. Mild calcification. Mild aortic stenosis. Vmax 2.6 m/sec, mean PG 12 mmHg, AVA 1.6 cm by continuity equation Mild to moderate aortic regurgitation. Moderate calcification of the mitral valve annulus. Mild mitral valve leaflet thickening. Trace mitral stenosis. Mean PG 3 mmHg, MVA 1.7 cm2 at HR 76 bpm. Trace mitral regurgitation. Mild tricuspid regurgitation. Estimated pulmonary artery systolic pressure 32 mmHg.      Consults: Case discussed with orthopedics, Dr. Blanchie Dessert who recommended placing a splint on patient's ankle and having him follow-up in the office to rule out a bimalleolar fracture.  Final Assessment and Plan:   82 year old male presents to the ED for evaluation after falling off a ladder approximately 5 feet.  He complains mostly of pain to the left ankle with some minimal pain in the neck as well.  States that he slightly bumped his head.  He does take Plavix but no other anticoagulants.  No LOC, nausea, vision changes.  He has a laceration to the left upper arm that is approximately 4 cm in length, curvilinear flap-like.  Bleeding well-controlled.  Neurovascularly intact distally to both the left upper and lower extremities.  Ankle joint is stable.  No spinal tenderness, step-offs, or deformities.  CT head and neck without acute findings.  Left humerus x-ray negative.  Left ankle x-ray shows questionable bimalleolar fracture.  He has likely avulsion fracture to the lateral malleolus as well as possible fracture to the medial malleolus but this is of indeterminate age.  He does have some tenderness to both aspects of the ankle.  Discussed with orthopedics who recommended splinting and close follow-up  in the office.  Laceration repaired as above with good result using 5 sutures.  Discussed wound care with patient including suture removal in 7 to 10 days.  PDMP reviewed.  Small amount of pain medication sent in for severe, breakthrough pain.  Patient instructed to use over-the-counter medication as first-line for pain and agreeable to do so.  Discussed all findings with patient and recommendations for close outpatient follow-up including with orthopedics, primary care, and for suture removal.  Patient expressed understanding  and agreement with plan.  Strict ED return precautions given, all questions answered, and stable for discharge.   Clinical Impression:  1. Fall, initial encounter   2. Arm laceration, left, initial encounter   3. Closed nondisplaced fracture of lateral malleolus of left fibula, initial encounter   4. Strain of neck muscle, initial encounter      Discharge           Final Clinical Impression(s) / ED Diagnoses Final diagnoses:  Fall, initial encounter  Arm laceration, left, initial encounter  Closed nondisplaced fracture of lateral malleolus of left fibula, initial encounter  Strain of neck muscle, initial encounter    Rx / DC Orders ED Discharge Orders          Ordered    oxyCODONE (ROXICODONE) 5 MG immediate release tablet  Every 6 hours PRN        10/14/22 1816              Tonette Lederer, PA-C 10/14/22 Foster Simpson, MD 10/15/22 1153

## 2022-10-14 NOTE — ED Triage Notes (Signed)
Fall from ladder into a fence. Lac on right arm Paing in right foot/ankle, pain in right shoulder  Denies loc, denies hitting head  [id number]  Unknown tetanus

## 2022-10-17 DIAGNOSIS — M25572 Pain in left ankle and joints of left foot: Secondary | ICD-10-CM | POA: Diagnosis not present

## 2022-10-27 DIAGNOSIS — Z6833 Body mass index (BMI) 33.0-33.9, adult: Secondary | ICD-10-CM | POA: Diagnosis not present

## 2022-10-27 DIAGNOSIS — Z4802 Encounter for removal of sutures: Secondary | ICD-10-CM | POA: Diagnosis not present

## 2022-10-31 DIAGNOSIS — M25572 Pain in left ankle and joints of left foot: Secondary | ICD-10-CM | POA: Diagnosis not present

## 2022-11-08 DIAGNOSIS — Z95818 Presence of other cardiac implants and grafts: Secondary | ICD-10-CM | POA: Diagnosis not present

## 2022-11-08 DIAGNOSIS — Z4509 Encounter for adjustment and management of other cardiac device: Secondary | ICD-10-CM | POA: Diagnosis not present

## 2022-11-08 DIAGNOSIS — I639 Cerebral infarction, unspecified: Secondary | ICD-10-CM | POA: Diagnosis not present

## 2022-11-21 DIAGNOSIS — M25572 Pain in left ankle and joints of left foot: Secondary | ICD-10-CM | POA: Diagnosis not present

## 2022-12-06 ENCOUNTER — Ambulatory Visit: Payer: Medicare Other | Admitting: Cardiology

## 2022-12-07 ENCOUNTER — Encounter: Payer: Self-pay | Admitting: Cardiology

## 2022-12-07 ENCOUNTER — Ambulatory Visit: Payer: Medicare Other | Admitting: Cardiology

## 2022-12-07 VITALS — BP 148/74 | HR 71 | Resp 16 | Ht 68.0 in | Wt 213.0 lb

## 2022-12-07 DIAGNOSIS — I350 Nonrheumatic aortic (valve) stenosis: Secondary | ICD-10-CM

## 2022-12-07 DIAGNOSIS — E1165 Type 2 diabetes mellitus with hyperglycemia: Secondary | ICD-10-CM | POA: Diagnosis not present

## 2022-12-07 DIAGNOSIS — E7800 Pure hypercholesterolemia, unspecified: Secondary | ICD-10-CM

## 2022-12-07 DIAGNOSIS — I10 Essential (primary) hypertension: Secondary | ICD-10-CM

## 2022-12-07 DIAGNOSIS — Z95818 Presence of other cardiac implants and grafts: Secondary | ICD-10-CM | POA: Diagnosis not present

## 2022-12-07 MED ORDER — EMPAGLIFLOZIN 25 MG PO TABS
25.0000 mg | ORAL_TABLET | Freq: Every day | ORAL | 1 refills | Status: DC
Start: 2022-12-07 — End: 2023-05-29

## 2022-12-07 MED ORDER — EMPAGLIFLOZIN 25 MG PO TABS
25.0000 mg | ORAL_TABLET | Freq: Every day | ORAL | 0 refills | Status: AC
Start: 2022-12-07 — End: ?

## 2022-12-07 NOTE — Patient Instructions (Signed)
You will be starting Jardiance 25 mg daily.  Very important to help personal hygiene as you may be predisposed to urinary tract infection and skin infections especially in diabetics.  2 prescriptions have been sent 1 to mail-order pharmacy in 1 to local pharmacy along with samples, you will only take 125 mg of Jardiance a day.  In 2 to 3 weeks after starting Jardiance, I would like you to go to [facility] to get blood work done, no fasting is necessary.  I will see you back in 6 weeks.

## 2022-12-07 NOTE — Progress Notes (Signed)
Primary Physician/Referring:  [name], MD  Patient ID: Vincent Alvarez, male    DOB: 02/06/1941, 82 y.o.   MRN: [id number]  Chief Complaint  Patient presents with   Heart Murmur   Follow-up    3 month   HPI:    Vincent Alvarez  is a 82 y.o. male with history of diabetes, hypertension, hyperlipidemia, hearing loss, gout and depression, recurrent TIAs and cryptogenic stroke, first in 2017 and most recently November 2022 underwent loop recorder implantation on 03/30/2021.    Denies chest pain, palpitations, syncope, near syncope.  Denies orthopnea, PND, leg edema.  Patient denies symptoms of claudication.  Recently had a fall while he was climbing a ladder.  Has hurt his left leg.  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   Depression    Diabetes mellitus without complication (HCC)    Diabetic retinopathy (HCC)    Gout    Hearing loss    Hyperlipidemia    Hypertension    Loop recorder Biotronik Biomonitor III 03/30/2021 03/30/2021   Onychomycosis    UTI (lower urinary tract infection)    Past Surgical History:  Procedure Laterality Date   CATARACT EXTRACTION, BILATERAL     CHOLECYSTECTOMY     COLONOSCOPY     TONSILLECTOMY     Family History  Problem Relation Age of Onset   CAD Mother 55   Colon cancer Father 66   Stroke Brother 73       4 strokes   Lung cancer Brother     Social History   Tobacco Use   Smoking status: Former    Current packs/day: 0.00    Types: Cigarettes    Quit date: 06/18/1985    Years since quitting: 37.4   Smokeless tobacco: Never  Substance Use Topics   Alcohol use: Yes    Alcohol/week: 2.0 standard drinks of alcohol    Types: 2 Glasses of wine per week    Comment: thinks he may be drinking too much, 2/4 + CAGE questions   Marital Status: Married   ROS  Review of Systems  Cardiovascular:  Positive for leg swelling. Negative for chest pain and dyspnea on  exertion.   Objective  Blood pressure (!) 148/74, pulse 71, resp. rate 16, height 5\' 8"  (1.727 m), weight 213 lb (96.6 kg), SpO2 97%.     12/07/2022   11:59 AM 10/14/2022    6:05 PM 10/14/2022    4:00 PM  Vitals with BMI  Height 5\' 8"     Weight 213 lbs    BMI 32.39    Systolic 148  154  Diastolic 74  80  Pulse 71 72 95      Physical Exam Neck:     Vascular: Carotid bruit (bilateral) present. No JVD.  Cardiovascular:     Rate and Rhythm: Normal rate and regular rhythm.     Pulses: Intact distal pulses.          Femoral pulses are 2+ on the right side and 2+ on the left side.      Popliteal pulses are 2+ on the right side and 2+ on the left side.       Dorsalis pedis pulses are 1+ on the right side and 1+ on the left side.       Posterior tibial pulses are 1+ on the right side and 1+ on the left side.  Heart sounds: S1 normal and S2 normal. Murmur heard.     Early systolic murmur is present with a grade of 2/6 at the upper right sternal border.     No gallop.  Pulmonary:     Effort: Pulmonary effort is normal.     Breath sounds: Normal breath sounds.  Abdominal:     General: Bowel sounds are normal.     Palpations: Abdomen is soft.  Musculoskeletal:     Right lower leg: Edema (1-2 + Below knee pitting edema) present.     Left lower leg: Edema (1-2 + Below knee pitting edema) present.     Laboratory examination:   External labs:   Cholesterol, total 88.000 mg 07/18/2022 HDL 40.000 mg 07/18/2022 LDL 30.000 mg 07/18/2022 Triglycerides 87.000 mg 07/18/2022  A1C 7.900 % 07/18/2022  Hemoglobin 13.300 g/d 02/23/2021  Creatinine, Serum 1.320 mg/ 07/18/2022 Potassium 4.800 mm 07/18/2022 ALT (SGPT) 25.000 U/L 07/18/2022  Radiology:   No results found. CTA head and neck 02/19/2021: 1. Negative for large vessel occlusion. 2. Generally mild for age atherosclerosis in the head and neck. Calcified plaque most pronounced at the left ICA bulb, but no significant arterial stenosis. 3.  Stable CT appearance of the brain since yesterday. Punctate left occipital infarct remains occult by CT.  Brain MRI 02/18/2021: 1. Punctate focus of acute ischemia in the left occipital lobe. No hemorrhage or mass effect. 2. Moderate chronic small vessel ischemic disease and volume loss.  Cardiac Studies:   PCV ECHOCARDIOGRAM COMPLETE 10/03/2022  Narrative Echocardiogram 10/03/2022: Left ventricle cavity is normal in size. Moderate concentric hypertrophy of the left ventricle. Normal global wall motion. Normal LV systolic function with EF 65-70%. Doppler evidence of grade I (impaired) diastolic dysfunction, normal LAP. Left atrial cavity is moderately dilated at 44 ml/m^2. Trileaflet aortic valve. Mild calcification. Mild aortic stenosis. Vmax 2.6 m/sec, mean PG 12 mmHg, AVA 1.6 cm by continuity equation Mild to moderate aortic regurgitation. Moderate calcification of the mitral valve annulus. Mild mitral valve leaflet thickening. Trace mitral stenosis. Mean PG 3 mmHg, MVA 1.7 cm2 at HR 76 bpm. Trace mitral regurgitation. Mild tricuspid regurgitation. Estimated pulmonary artery systolic pressure 32 mmHg.   Loop recorder Biotronik Biomonitor III 03/30/2021    Remote loop recorder transmission 11/08/2022: Normal sinus rhythm, no further brief 4 seconds of AT/AF noted on 03/23/22. No symptoms reported.    EKG:   EKG 09/05/2022: Normal sinus rhythm at the rate of 75 bpm, left intrafascicular block.  Right bundle branch block.  Bifascicular block.  Compared to 04/12/2022, no significant change.  Allergies & Medications   Allergies  Allergen Reactions   Shrimp [Shellfish Allergy] Nausea And Vomiting    Current Outpatient Medications:    acetaminophen (TYLENOL) 325 MG tablet, Take 650 mg by mouth every 6 (six) hours as needed for moderate pain or headache., Disp: , Rfl:    amLODipine (NORVASC) 5 MG tablet, Take 5 mg by mouth every morning., Disp: , Rfl:    Artificial Tear Ointment (DRY  EYES OP), Place 1 drop into both eyes daily as needed (dry eyes)., Disp: , Rfl:    atorvastatin (LIPITOR) 80 MG tablet, Take 1 tablet (80 mg total) by mouth every morning., Disp: 30 tablet, Rfl: 0   B Complex Vitamins (B COMPLEX PO), Take 1 tablet by mouth daily., Disp: , Rfl:    clopidogrel (PLAVIX) 75 MG tablet, Take 1 tablet (75 mg total) by mouth daily. Continue taking this medication, Disp: 30 tablet, Rfl: 1  empagliflozin (JARDIANCE) 25 MG TABS tablet, Take 1 tablet (25 mg total) by mouth daily before breakfast., Disp: 90 tablet, Rfl: 1   empagliflozin (JARDIANCE) 25 MG TABS tablet, Take 1 tablet (25 mg total) by mouth daily before breakfast., Disp: 30 tablet, Rfl: 0   FLUoxetine (PROZAC) 20 MG capsule, Take 20 mg by mouth every morning. , Disp: , Rfl:    losartan (COZAAR) 100 MG tablet, Take 100 mg by mouth daily., Disp: , Rfl:    metFORMIN (GLUCOPHAGE) 500 MG tablet, Take 1,000 mg by mouth 2 (two) times daily with a meal. , Disp: , Rfl:    sitaGLIPtin (JANUVIA) 100 MG tablet, Take 100 mg by mouth daily., Disp: , Rfl:    tamsulosin (FLOMAX) 0.4 MG CAPS capsule, Take 0.4 mg by mouth daily after supper. , Disp: , Rfl:    traZODone (DESYREL) 150 MG tablet, Take 50 mg by mouth at bedtime., Disp: , Rfl:    fluticasone (FLONASE) 50 MCG/ACT nasal spray, Place 1 spray into both nostrils daily as needed for allergies.  (Patient not taking: Reported on 12/07/2022), Disp: , Rfl:    Assessment     ICD-10-CM   1. Primary hypertension  I10 Basic metabolic panel    2. Hypercholesterolemia  E78.00     3. Mild aortic stenosis  I35.0     4. Loop recorder Biotronik Biomonitor III 03/30/2021  Z95.818     5. Type 2 diabetes mellitus with hyperglycemia, without long-term current use of insulin (HCC)  E11.65 empagliflozin (JARDIANCE) 25 MG TABS tablet    empagliflozin (JARDIANCE) 25 MG TABS tablet        Medications Discontinued During This Encounter  Medication Reason   Multiple Vitamins-Minerals  (PRESERVISION AREDS 2+MULTI VIT) CAPS     Meds ordered this encounter  Medications   empagliflozin (JARDIANCE) 25 MG TABS tablet    Sig: Take 1 tablet (25 mg total) by mouth daily before breakfast.    Dispense:  90 tablet    Refill:  1   empagliflozin (JARDIANCE) 25 MG TABS tablet    Sig: Take 1 tablet (25 mg total) by mouth daily before breakfast.    Dispense:  30 tablet    Refill:  0   Recommendations:   Vincent Alvarez is a 82 y.o. male with history of diabetes, hypertension, hyperlipidemia, hearing loss, gout and depression, recurrent TIAs and cryptogenic stroke, first in 2017 and most recently November 2022 underwent loop recorder implantation on 03/30/2021.    1. Primary hypertension Patient's blood pressure was elevated today, Patient is presently on amlodipine, losartan.  Continue the same for now.  I would like to revisit his blood pressure upon his next office visit in 6 weeks as I am starting him on Jardiance and expect lower blood pressure. - Basic metabolic panel  2. Hypercholesterolemia External labs reviewed, lipids under good control.  Continue the same.  3. Mild aortic stenosis Echocardiogram reviewed, very mild aortic stenosis.  4. Loop recorder Biotronik Biomonitor III 03/30/2021 Due to cryptogenic stroke, presently on loop recorder monitoring, he has not had any atrial fibrillation or heart block.  5. Type 2 diabetes mellitus with hyperglycemia, without long-term current use of insulin (HCC) Diabetes is uncontrolled.  Will start him on Jardiance 25 mg daily.  Will obtain BMP in 2 to 3 weeks after starting the medication.  Would like to see him back in 6 weeks both for follow-up of hypertension and tolerance for Jardiance.  Personal hygiene discussed with the patient  in general as I am starting him on Jardiance.  I also discussed with him regarding making lifestyle changes and dietary changes, patient essentially eats out and fast foods. 40 min  encounter    Yates Decamp, MD, [facility] 12/07/2022, 12:45 PM Office: [phone] Fax: [phone] Pager: [phone]

## 2022-12-09 DIAGNOSIS — Z95818 Presence of other cardiac implants and grafts: Secondary | ICD-10-CM | POA: Diagnosis not present

## 2022-12-09 DIAGNOSIS — Z4509 Encounter for adjustment and management of other cardiac device: Secondary | ICD-10-CM | POA: Diagnosis not present

## 2022-12-09 DIAGNOSIS — I639 Cerebral infarction, unspecified: Secondary | ICD-10-CM | POA: Diagnosis not present

## 2022-12-15 ENCOUNTER — Telehealth: Payer: Self-pay

## 2022-12-15 DIAGNOSIS — Z6833 Body mass index (BMI) 33.0-33.9, adult: Secondary | ICD-10-CM | POA: Diagnosis not present

## 2022-12-15 DIAGNOSIS — E11319 Type 2 diabetes mellitus with unspecified diabetic retinopathy without macular edema: Secondary | ICD-10-CM | POA: Diagnosis not present

## 2022-12-15 NOTE — Telephone Encounter (Signed)
No action done

## 2022-12-19 DIAGNOSIS — M1712 Unilateral primary osteoarthritis, left knee: Secondary | ICD-10-CM | POA: Diagnosis not present

## 2023-01-18 ENCOUNTER — Ambulatory Visit: Payer: Medicare Other | Admitting: Cardiology

## 2023-01-25 DIAGNOSIS — E11319 Type 2 diabetes mellitus with unspecified diabetic retinopathy without macular edema: Secondary | ICD-10-CM | POA: Diagnosis not present

## 2023-02-01 DIAGNOSIS — R634 Abnormal weight loss: Secondary | ICD-10-CM | POA: Diagnosis not present

## 2023-02-01 DIAGNOSIS — Z6832 Body mass index (BMI) 32.0-32.9, adult: Secondary | ICD-10-CM | POA: Diagnosis not present

## 2023-02-01 DIAGNOSIS — E11319 Type 2 diabetes mellitus with unspecified diabetic retinopathy without macular edema: Secondary | ICD-10-CM | POA: Diagnosis not present

## 2023-02-01 DIAGNOSIS — Z23 Encounter for immunization: Secondary | ICD-10-CM | POA: Diagnosis not present

## 2023-02-22 ENCOUNTER — Telehealth: Payer: Self-pay

## 2023-02-22 NOTE — Telephone Encounter (Signed)
No messages received for at least 21 days Last message received 80 days ago. The patient will be deactivated in 10 days.       Yellow status Administrative  The patient is scheduled for an HM follow-up on Feb 22, 2023 The HM follow-up transmission has not arrived yet. The time of arrival depends on the HM transmission time of the implanted device.       Patient has a biotronik ILR and I believe was a prior patient with [facility].  See OV note from 10/1 with issues with remote monitoring.   Forwarding to [facility]MA Device team to assist with troubleshooting and transfer from [facility].

## 2023-03-02 NOTE — Telephone Encounter (Signed)
I spoke with the patient and he has talked with [employer] tech support. He needs remote monitor turned on in his device. I emailed [employer] reps so they can come to his 11/25th appointment and turn on home monitoring.

## 2023-03-07 DIAGNOSIS — H26493 Other secondary cataract, bilateral: Secondary | ICD-10-CM | POA: Diagnosis not present

## 2023-03-07 DIAGNOSIS — H353131 Nonexudative age-related macular degeneration, bilateral, early dry stage: Secondary | ICD-10-CM | POA: Diagnosis not present

## 2023-03-07 DIAGNOSIS — H524 Presbyopia: Secondary | ICD-10-CM | POA: Diagnosis not present

## 2023-03-07 DIAGNOSIS — E113293 Type 2 diabetes mellitus with mild nonproliferative diabetic retinopathy without macular edema, bilateral: Secondary | ICD-10-CM | POA: Diagnosis not present

## 2023-03-10 NOTE — Progress Notes (Unsigned)
Cardiology Office Note:  .   Date:  03/12/2023  ID:  Vincent Alvarez, DOB [birth year], MRN [id number] PCP: [name], MD  [facility] Providers Cardiologist:  Yates Decamp, MD   History of Present Illness: Vincent Alvarez is a 82 y.o. male with history of diabetes, hypertension, hyperlipidemia, hearing loss, gout and depression, recurrent TIAs and cryptogenic stroke, first in 2017 and most recently November 2022 underwent loop recorder implantation on 03/30/2021.    Discussed the use of AI scribe software for clinical note transcription with the patient, who gave verbal consent to proceed.  History of Present Illness   The patient, with a history of heart disease and diabetes, presents with a brief episode of chest pain that occurred the previous night after eating. The pain lasted approximately fifteen seconds and was located in the upper chest. He denies any associated symptoms and has not experienced any similar episodes since.  The patient has been maintaining a moderate level of activity, including yard work, without any associated symptoms. He denies any stroke-like symptoms and any bleeding problems while on clopidogrel. He has a loop recorder implanted and has not noticed any irregularities.  The patient lives alone since the death of his spouse a year ago, but maintains regular contact with a niece who lives nearby. He prepares most of his own meals, with occasional visits to fast food restaurants, but has made efforts to reduce his salt intake. His diabetes has been well-controlled, with a recent morning reading of 54 mg/dL.  The patient also reports a recent weight loss, which he attributes to his diabetes medication, Jardiance. He has been trying to maintain a healthy diet, but admits to occasional indulgences in sugary foods.        Review of Systems  Cardiovascular:  Negative for chest pain, dyspnea on exertion and leg swelling.    Labs   External  Labs:  Cholesterol, total 88.000 mg 07/18/2022 HDL 40.000 mg 07/18/2022 LDL 30.000 mg 07/18/2022 Triglycerides 87.000 mg 07/18/2022  A1C 7.900 % 07/18/2022  Hemoglobin 13.300 g/d 02/23/2021  Creatinine, Serum 1.320 mg/ 07/18/2022 Potassium 4.800 mm 07/18/2022 ALT (SGPT) 25.000 U/L 07/18/2022   Physical Exam:   VS:  BP (!) 112/58   Pulse 80   Ht 5\' 8"  (1.727 m)   Wt 206 lb 6.4 oz (93.6 kg)   SpO2 97%   BMI 31.38 kg/m    Wt Readings from Last 3 Encounters:  03/12/23 206 lb 6.4 oz (93.6 kg)  12/07/22 213 lb (96.6 kg)  09/05/22 217 lb (98.4 kg)     Physical Exam Neck:     Vascular: Carotid bruit (soft conducted bruit) present. No JVD.  Cardiovascular:     Rate and Rhythm: Normal rate and regular rhythm.     Pulses: Intact distal pulses.          Dorsalis pedis pulses are 0 on the right side and 0 on the left side.       Posterior tibial pulses are 0 on the right side and 0 on the left side.     Heart sounds: Murmur heard.     Early systolic murmur is present with a grade of 2/6 at the upper right sternal border.     No gallop.  Pulmonary:     Effort: Pulmonary effort is normal.     Breath sounds: Normal breath sounds.  Abdominal:     General: Bowel sounds are normal.     Palpations:  Abdomen is soft.  Musculoskeletal:     Right lower leg: No edema.     Left lower leg: No edema.  Skin:    Capillary Refill: Capillary refill takes less than 2 seconds.     Studies Reviewed: .     Carotid artery duplex 12/20/2015: - Technically difficult due to high bifurcation and tortuosity.  - Bilateral - 1% to 39% ICA stenosis. Vertebral artey flow is    antegrade   Echocardiogram 10/03/2022: Left ventricle cavity is normal in size. Moderate concentric hypertrophy of the left ventricle. Normal global wall motion. Normal LV systolic function with EF 65-70%. Doppler evidence of grade I (impaired) diastolic dysfunction, normal LAP. Left atrial cavity is moderately dilated at 44  ml/m^2. Trileaflet aortic valve. Mild calcification. Mild aortic stenosis. Vmax 2.6 m/sec, mean PG 12 mmHg, AVA 1.6 cm by continuity equation Mild to moderate aortic regurgitation. Moderate calcification of the mitral valve annulus. Mild mitral valve leaflet thickening. Trace mitral stenosis. Mean PG 3 mmHg, MVA 1.7 cm2 at HR 76 bpm. Trace mitral regurgitation. Mild tricuspid regurgitation. Estimated pulmonary artery systolic pressure 32 mmHg.  In person loop recorder check 03/12/2023: Last remote transmission August 2024. There was 1 episode of brief atrial tachycardia for 4 seconds on 03/22/2022.  Otherwise predominant rhythm is normal sinus rhythm.  No heart block, no atrial fibrillation, no symptoms reported. No changes done today  EKG:         EKG 09/05/2022: Normal sinus rhythm at the rate of 75 bpm, left intrafascicular block.  Right bundle branch block.  Bifascicular block.  Compared to 04/12/2022, no significant change.  Medications and allergies    Allergies  Allergen Reactions   Shrimp [Shellfish Allergy] Nausea And Vomiting    Current Meds  Medication Sig   acetaminophen (TYLENOL) 325 MG tablet Take 650 mg by mouth every 6 (six) hours as needed for moderate pain or headache.   amLODipine (NORVASC) 5 MG tablet Take 5 mg by mouth every morning.   Artificial Tear Ointment (DRY EYES OP) Place 1 drop into both eyes daily as needed (dry eyes).   B Complex Vitamins (B COMPLEX PO) Take 1 tablet by mouth daily.   clopidogrel (PLAVIX) 75 MG tablet Take 1 tablet (75 mg total) by mouth daily. Continue taking this medication   empagliflozin (JARDIANCE) 25 MG TABS tablet Take 1 tablet (25 mg total) by mouth daily before breakfast.   empagliflozin (JARDIANCE) 25 MG TABS tablet Take 1 tablet (25 mg total) by mouth daily before breakfast.   FLUoxetine (PROZAC) 20 MG capsule Take 20 mg by mouth every morning.    fluticasone (FLONASE) 50 MCG/ACT nasal spray Place 1 spray into both nostrils  daily as needed for allergies.   losartan (COZAAR) 100 MG tablet Take 100 mg by mouth daily.   metFORMIN (GLUCOPHAGE) 500 MG tablet Take 1,000 mg by mouth 2 (two) times daily with a meal.    sitaGLIPtin (JANUVIA) 100 MG tablet Take 100 mg by mouth daily.   tamsulosin (FLOMAX) 0.4 MG CAPS capsule Take 0.4 mg by mouth daily after supper.    traZODone (DESYREL) 150 MG tablet Take 50 mg by mouth at bedtime.     ASSESSMENT AND PLAN: .      ICD-10-CM   1. Cryptogenic stroke (HCC)  I63.9     2. Loop recorder Biotronik Biomonitor III 03/30/2021  Z95.818     3. Primary hypertension  I10     4. Mild aortic stenosis  I35.0  5. Type 2 diabetes mellitus with hyperglycemia, without long-term current use of insulin (HCC)  E11.65         Assessment and Plan  1. Cryptogenic stroke [facility]) Not had any recurrence of strokelike symptoms, presently tolerating Plavix without any side effects, no bleeding diathesis.  Labs  stable, lipids well-controlled..  2. Loop recorder Biotronik Biomonitor III 03/30/2021 No irregular heartbeats noted on loop recorder. Patient is on Clopidogrel. I did interrogate the loop recorder today, fortunately there is no atrial fibrillation.  We have reset the device, he has not been transmitting for almost 6 months, he will let us know if he has any problems transmitting. -Continue Clopidogrel 1 pill daily. -Ensure loop recorder is functioning and in range.  3. Primary hypertension Well controlled on Losartan. -Continue Losartan as prescribed.  4. Mild aortic stenosis I reviewed the results of the recently performed echocardiogram, very mild aortic stenosis.  He remains asymptomatic.  Normal LV systolic function.  Continue present medical management.  5. Type 2 diabetes mellitus with hyperglycemia, without long-term current use of insulin (HCC) Diabetes is gradually improving.  He is presently tolerating Jardiance.  No changes in the medications were done today.       Chest Pain Brief episode of chest pain lasting 15 seconds after eating that occurred last night. No associated symptoms reported. -Continue current management and monitor symptoms.  General Health Maintenance / Followup Plans -Continue healthy dietary changes, limit fast food and high sugar foods. -Return for follow-up in 1 year.               Signed,  Yates Decamp, MD, [facility] 03/12/2023, 8:46 AM [facility] [address] [place], [place] [address] Phone: [phone]. Fax:  [phone]

## 2023-03-12 ENCOUNTER — Encounter: Payer: Self-pay | Admitting: Cardiology

## 2023-03-12 ENCOUNTER — Ambulatory Visit: Payer: Medicare Other | Attending: Cardiology | Admitting: Cardiology

## 2023-03-12 VITALS — BP 112/58 | HR 80 | Ht 68.0 in | Wt 206.4 lb

## 2023-03-12 DIAGNOSIS — I10 Essential (primary) hypertension: Secondary | ICD-10-CM | POA: Diagnosis not present

## 2023-03-12 DIAGNOSIS — E1165 Type 2 diabetes mellitus with hyperglycemia: Secondary | ICD-10-CM | POA: Insufficient documentation

## 2023-03-12 DIAGNOSIS — I350 Nonrheumatic aortic (valve) stenosis: Secondary | ICD-10-CM | POA: Insufficient documentation

## 2023-03-12 DIAGNOSIS — I639 Cerebral infarction, unspecified: Secondary | ICD-10-CM | POA: Insufficient documentation

## 2023-03-12 DIAGNOSIS — Z95818 Presence of other cardiac implants and grafts: Secondary | ICD-10-CM | POA: Diagnosis not present

## 2023-03-12 NOTE — Patient Instructions (Signed)

## 2023-03-21 DIAGNOSIS — H26491 Other secondary cataract, right eye: Secondary | ICD-10-CM | POA: Diagnosis not present

## 2023-03-27 IMAGING — CT CT ANGIO HEAD-NECK (W OR W/O PERF)
1 of 11 series · 5 of 33 positions shown · IV contrast (OMNI 350)
Comparison: Brain MRI and head CT yesterday.

CLINICAL DATA: 80-year-old male with neurologic deficit and
punctate left occipital lobe infarct on MRI yesterday.

EXAM:
CT ANGIOGRAPHY HEAD AND NECK
TECHNIQUE: Multidetector CT imaging of the head and neck was performed using
the standard protocol during bolus administration of intravenous
contrast. Multiplanar CT image reconstructions and MIPs were
obtained to evaluate the vascular anatomy. Carotid stenosis
measurements (when applicable) are obtained utilizing NASCET
criteria, using the distal internal carotid diameter as the
denominator.
CONTRAST:  75mL OMNIPAQUE IOHEXOL 350 MG/ML SOLN

[Series 11: cta neck axial · axial · 0.39mm/px · z∈[-285,-51]mm · 5 of 357 slices shown]
[im 60/357  soft-tissue]
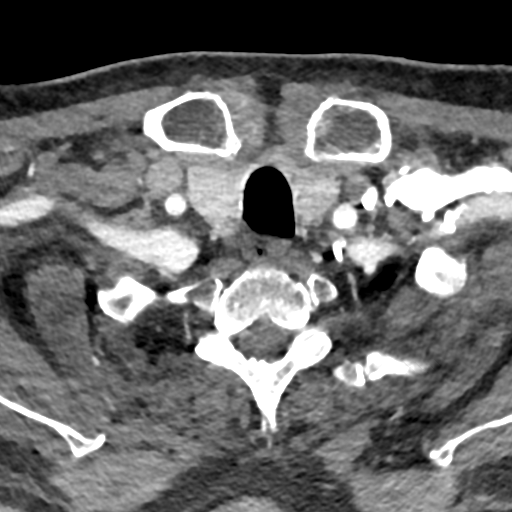
[im 119/357  bone]
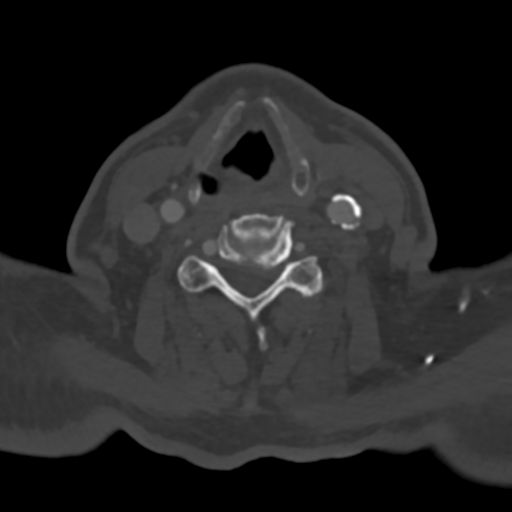
[im 179/357  soft-tissue]
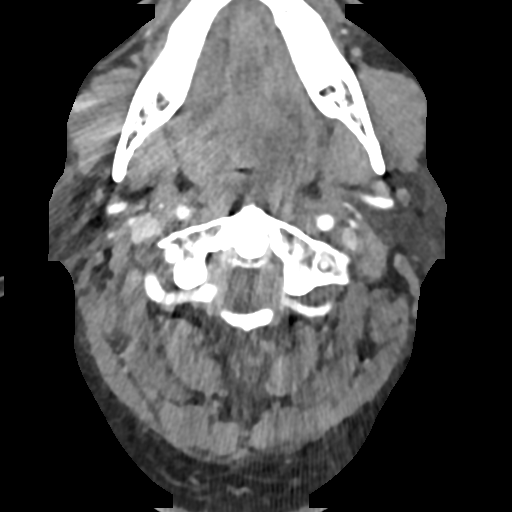
[im 238/357  bone]
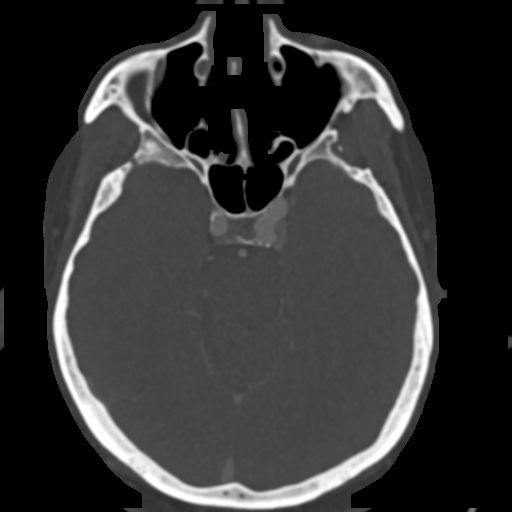
[im 297/357  soft-tissue]
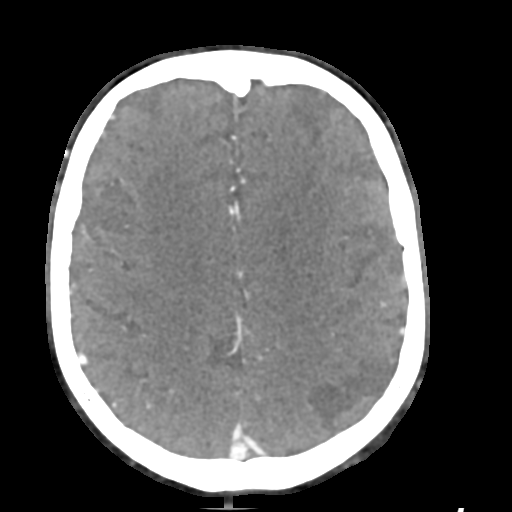

[5 of 33 positions shown; findings below may reference images not displayed]

FINDINGS: CT HEAD

Brain: Stable CT appearance of chronic small vessel disease since
yesterday. Punctate left occipital pole infarct remains occult by
CT. No midline shift, mass effect, or evidence of intracranial mass
lesion. No acute intracranial hemorrhage identified.

Calvarium and skull base: No acute osseous abnormality identified.

Paranasal sinuses: Visualized paranasal sinuses and mastoids are
stable and well aerated.

Orbits: No acute orbit or scalp soft tissue finding.

CTA NECK

Skeleton: Cervical spine degeneration although relatively mild for
age. No acute osseous abnormality identified.

Upper chest: Negative.

Other neck: Trace retained secretions in the hypopharynx. Otherwise
negative

Aortic arch: 3 vessel arch configuration. Mild arch atherosclerosis.

Right carotid system: Negative brachiocephalic artery and proximal
right CCA. Minor calcified plaque proximal to the bifurcation
without stenosis. Mild calcified plaque at the medial right ICA
origin and bulb without stenosis.

Left carotid system: Negative left CCA. Circumferential calcified
plaque at the left carotid bifurcation and ICA origin but no
stenosis. Additional moderate calcified plaque at the anterior left
ICA bulb, but less than 50 % stenosis with respect to the distal
vessel.

Vertebral arteries:
Mild calcified plaque of the proximal right subclavian artery
without stenosis. Similar mild calcified plaque at the right
vertebral artery origin without stenosis. Tortuous right V1 segment.
Dominant right vertebral artery otherwise normal to the skull base.

Mild atherosclerosis in the proximal left subclavian without
stenosis. Normal left vertebral artery origin. Non dominant left
vertebral artery is patent and normal to the skull base.

CTA HEAD

Posterior circulation: Dominant right vertebral artery with mild
calcified plaque and no stenosis to the vertebrobasilar junction.
Diminutive distal left vertebral artery without stenosis remains
patent to the basilar. Normal AICA origins. Patent basilar artery
without stenosis. Normal SCA and PCA origins. Tortuous left
posterior communicating artery, the right is diminutive or absent.
Bilateral PCA branches are within normal limits.

Anterior circulation: Both ICA siphons are patent. The left siphon
appears dominant, mildly ectatic with mild calcified plaque but no
stenosis. Normal left ophthalmic and posterior communicating artery
origins. Right ICA siphon is patent with a greater degree of
calcified plaque in the cavernous segment but no significant
stenosis.

Patent carotid termini. Dominant left and diminutive right ACA A1
segments. Normal MCA and ACA origins. Ectatic anterior communicating
artery without discrete aneurysm. Bilateral ACA branches are within
normal limits. Left MCA M1 segment and bifurcation are patent
without stenosis. Right MCA M1 segment and bifurcation are patent
without stenosis. Bilateral MCA branches are within normal limits.

Venous sinuses: Patent.

Anatomic variants: Dominant right vertebral artery. Dominant left
and diminutive right ACA A1 segments.

Review of the MIP images confirms the above findings
IMPRESSION: 1. Negative for large vessel occlusion.
2. Generally mild for age atherosclerosis in the head and neck.
Calcified plaque most pronounced at the left ICA bulb, but no
significant arterial stenosis.
3. Stable CT appearance of the brain since yesterday. Punctate left
occipital infarct remains occult by CT.

## 2023-04-13 ENCOUNTER — Ambulatory Visit: Payer: Medicare Other | Admitting: Internal Medicine

## 2023-04-20 ENCOUNTER — Ambulatory Visit (INDEPENDENT_AMBULATORY_CARE_PROVIDER_SITE_OTHER): Payer: Medicare Other

## 2023-04-20 DIAGNOSIS — I639 Cerebral infarction, unspecified: Secondary | ICD-10-CM | POA: Diagnosis not present

## 2023-04-25 LAB — CUP PACEART REMOTE DEVICE CHECK
Date Time Interrogation Session: 20250103073542
Implantable Pulse Generator Implant Date: 20221214
Pulse Gen Model: 436066
Pulse Gen Serial Number: 94077050

## 2023-05-08 ENCOUNTER — Telehealth: Payer: Self-pay

## 2023-05-08 NOTE — Telephone Encounter (Addendum)
B[employer]ronik alert Appears some V oversensing on ILR.  And likely AT w/ occ PVC's noted (no change in morphology). Will continue to monitor.

## 2023-05-25 DIAGNOSIS — M1712 Unilateral primary osteoarthritis, left knee: Secondary | ICD-10-CM | POA: Diagnosis not present

## 2023-05-28 ENCOUNTER — Other Ambulatory Visit: Payer: Self-pay | Admitting: Cardiology

## 2023-05-28 DIAGNOSIS — E1165 Type 2 diabetes mellitus with hyperglycemia: Secondary | ICD-10-CM

## 2023-05-28 NOTE — Addendum Note (Signed)
 Addended by: VICCI SELLER A on: 05/28/2023 01:24 PM   Modules accepted: Orders

## 2023-05-28 NOTE — Progress Notes (Signed)
 [facility] Summary Report / Loop Recorder

## 2023-06-29 ENCOUNTER — Ambulatory Visit

## 2023-06-29 DIAGNOSIS — I639 Cerebral infarction, unspecified: Secondary | ICD-10-CM | POA: Diagnosis not present

## 2023-07-02 LAB — CUP PACEART REMOTE DEVICE CHECK
Date Time Interrogation Session: 20250317104243
Implantable Pulse Generator Implant Date: 20221214
Pulse Gen Model: 436066
Pulse Gen Serial Number: 94077050

## 2023-07-23 DIAGNOSIS — E11319 Type 2 diabetes mellitus with unspecified diabetic retinopathy without macular edema: Secondary | ICD-10-CM | POA: Diagnosis not present

## 2023-07-23 DIAGNOSIS — I10 Essential (primary) hypertension: Secondary | ICD-10-CM | POA: Diagnosis not present

## 2023-07-23 DIAGNOSIS — E785 Hyperlipidemia, unspecified: Secondary | ICD-10-CM | POA: Diagnosis not present

## 2023-08-09 NOTE — Progress Notes (Signed)
 [facility] Summary Report / Loop Recorder

## 2023-08-09 NOTE — Addendum Note (Signed)
 Addended by: Lott Rouleau A on: 08/09/2023 11:22 AM   Modules accepted: Orders

## 2023-09-14 DIAGNOSIS — Z0000 Encounter for general adult medical examination without abnormal findings: Secondary | ICD-10-CM | POA: Diagnosis not present

## 2023-09-14 DIAGNOSIS — I679 Cerebrovascular disease, unspecified: Secondary | ICD-10-CM | POA: Diagnosis not present

## 2023-09-14 DIAGNOSIS — E785 Hyperlipidemia, unspecified: Secondary | ICD-10-CM | POA: Diagnosis not present

## 2023-09-14 DIAGNOSIS — E11319 Type 2 diabetes mellitus with unspecified diabetic retinopathy without macular edema: Secondary | ICD-10-CM | POA: Diagnosis not present

## 2023-09-14 DIAGNOSIS — I10 Essential (primary) hypertension: Secondary | ICD-10-CM | POA: Diagnosis not present

## 2023-09-14 DIAGNOSIS — Z6831 Body mass index (BMI) 31.0-31.9, adult: Secondary | ICD-10-CM | POA: Diagnosis not present

## 2023-09-14 DIAGNOSIS — N183 Chronic kidney disease, stage 3 unspecified: Secondary | ICD-10-CM | POA: Diagnosis not present

## 2023-09-14 DIAGNOSIS — F324 Major depressive disorder, single episode, in partial remission: Secondary | ICD-10-CM | POA: Diagnosis not present

## 2023-09-28 ENCOUNTER — Ambulatory Visit

## 2023-09-28 ENCOUNTER — Encounter: Payer: Self-pay | Admitting: Podiatry

## 2023-09-28 ENCOUNTER — Ambulatory Visit (INDEPENDENT_AMBULATORY_CARE_PROVIDER_SITE_OTHER): Admitting: Podiatry

## 2023-09-28 ENCOUNTER — Encounter

## 2023-09-28 DIAGNOSIS — M79674 Pain in right toe(s): Secondary | ICD-10-CM

## 2023-09-28 DIAGNOSIS — M79675 Pain in left toe(s): Secondary | ICD-10-CM

## 2023-09-28 DIAGNOSIS — B351 Tinea unguium: Secondary | ICD-10-CM

## 2023-09-28 DIAGNOSIS — I639 Cerebral infarction, unspecified: Secondary | ICD-10-CM

## 2023-09-28 DIAGNOSIS — E1142 Type 2 diabetes mellitus with diabetic polyneuropathy: Secondary | ICD-10-CM | POA: Diagnosis not present

## 2023-09-28 LAB — CUP PACEART REMOTE DEVICE CHECK
Date Time Interrogation Session: 20250613090240
Implantable Pulse Generator Implant Date: 20221214
Pulse Gen Model: 436066
Pulse Gen Serial Number: 94077050

## 2023-09-28 NOTE — Progress Notes (Signed)
  Subjective:  Patient ID: Vincent Alvarez, male    DOB: 1940/06/29,   MRN: [id number]  No chief complaint on file.   83 y.o. male presents for concern of thickened elongated and painful nails that are difficult to trim. Requesting to have them trimmed today. Denies burning and tingling in their feet. Patient is diabetic and last A1c was  Lab Results  Component Value Date   HGBA1C 6.7 (H) 02/19/2021   .   PCP:  Jimmey Mould, MD    . Denies any other pedal complaints. Denies n/v/f/c.   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 CFT <3 seconds. Absent hair growth on digits. Edema noted to bilateral lower extremities. Xerosis noted bilaterally.  Skin. No lacerations or abrasions bilateral feet. Nails 1-5 bilateral  are thickened discolored and elongated with subungual debris.  Musculoskeletal: MMT 5/5 bilateral lower extremities in DF, PF, Inversion and Eversion. Deceased ROM in DF of ankle joint.  Neurological: Sensation intact to light touch. Protective sensation diminished bilateral.    Assessment:   1. Pain due to onychomycosis of toenails of both feet   2. Type 2 diabetes mellitus with peripheral neuropathy (HCC)      Plan:  Patient was evaluated and treated and all questions answered. -Discussed and educated patient on diabetic foot care, especially with  regards to the vascular, neurological and musculoskeletal systems.  -Stressed the importance of good glycemic control and the detriment of not  controlling glucose levels in relation to the foot. -Discussed supportive  shoes at all times and checking feet regularly.  -Mechanically debrided all nails 1-5 bilateral using sterile nail nipper and filed with dremel without incident  -Answered all patient questions -Patient to return  in 3 months for at risk foot care -Patient advised to call the office if any problems or questions arise in the meantime.   Jennefer Moats, DPM

## 2023-09-30 ENCOUNTER — Ambulatory Visit: Payer: Self-pay | Admitting: Cardiology

## 2023-10-19 ENCOUNTER — Emergency Department (HOSPITAL_BASED_OUTPATIENT_CLINIC_OR_DEPARTMENT_OTHER)

## 2023-10-19 ENCOUNTER — Emergency Department (HOSPITAL_BASED_OUTPATIENT_CLINIC_OR_DEPARTMENT_OTHER)
Admission: EM | Admit: 2023-10-19 | Discharge: 2023-10-19 | Disposition: A | Discharge status: Home / Self Care | Attending: Emergency Medicine | Admitting: Emergency Medicine

## 2023-10-19 ENCOUNTER — Encounter (HOSPITAL_BASED_OUTPATIENT_CLINIC_OR_DEPARTMENT_OTHER): Payer: Self-pay

## 2023-10-19 ENCOUNTER — Other Ambulatory Visit: Payer: Self-pay

## 2023-10-19 DIAGNOSIS — Z79899 Other long term (current) drug therapy: Secondary | ICD-10-CM | POA: Diagnosis not present

## 2023-10-19 DIAGNOSIS — Y9389 Activity, other specified: Secondary | ICD-10-CM | POA: Diagnosis not present

## 2023-10-19 DIAGNOSIS — I10 Essential (primary) hypertension: Secondary | ICD-10-CM | POA: Insufficient documentation

## 2023-10-19 DIAGNOSIS — Z8673 Personal history of transient ischemic attack (TIA), and cerebral infarction without residual deficits: Secondary | ICD-10-CM | POA: Diagnosis not present

## 2023-10-19 DIAGNOSIS — S0990XA Unspecified injury of head, initial encounter: Secondary | ICD-10-CM | POA: Insufficient documentation

## 2023-10-19 DIAGNOSIS — S01511A Laceration without foreign body of lip, initial encounter: Secondary | ICD-10-CM | POA: Diagnosis not present

## 2023-10-19 DIAGNOSIS — R220 Localized swelling, mass and lump, head: Secondary | ICD-10-CM | POA: Diagnosis not present

## 2023-10-19 DIAGNOSIS — E11319 Type 2 diabetes mellitus with unspecified diabetic retinopathy without macular edema: Secondary | ICD-10-CM | POA: Insufficient documentation

## 2023-10-19 DIAGNOSIS — S0993XA Unspecified injury of face, initial encounter: Secondary | ICD-10-CM

## 2023-10-19 DIAGNOSIS — Y92094 Garage of other non-institutional residence as the place of occurrence of the external cause: Secondary | ICD-10-CM | POA: Insufficient documentation

## 2023-10-19 DIAGNOSIS — S0121XA Laceration without foreign body of nose, initial encounter: Secondary | ICD-10-CM | POA: Diagnosis not present

## 2023-10-19 DIAGNOSIS — Z7984 Long term (current) use of oral hypoglycemic drugs: Secondary | ICD-10-CM | POA: Diagnosis not present

## 2023-10-19 DIAGNOSIS — W228XXA Striking against or struck by other objects, initial encounter: Secondary | ICD-10-CM | POA: Insufficient documentation

## 2023-10-19 MED ORDER — LORAZEPAM 1 MG PO TABS
0.5000 mg | ORAL_TABLET | Freq: Once | ORAL | Status: AC
  Administered 2023-10-19: 0.5 mg via ORAL
  Filled 2023-10-19: qty 1

## 2023-10-19 MED ORDER — CEFADROXIL 500 MG PO CAPS: 500.0000 mg | ORAL_CAPSULE | Freq: Two times a day (BID) | ORAL | 0 refills | Status: DC

## 2023-10-19 MED ORDER — LIDOCAINE HCL (PF) 1 % IJ SOLN
10.0000 mL | Freq: Once | INTRAMUSCULAR | Status: AC
  Administered 2023-10-19: 10 mL
  Filled 2023-10-19: qty 10

## 2023-10-19 NOTE — ED Triage Notes (Signed)
 Pt reports getting struck in the face with a garage door spring/rod. Pt denies any LOC. Pt denies any blood thinners. Pt has minor lac on nose and lip. Bleeding controlled.

## 2023-10-19 NOTE — Discharge Instructions (Signed)
 As discussed, your lacerations were repaired using absorbable stitches.  This should absorb in the next 5 to 7 days.  You may take Tylenol  for pain.  Will place on antibiotics for prevention of infection.  Recommend follow-up with primary care for reassessment.  Please do not hesitate to return to emergency department if the worrisome signs and symptoms we discussed become apparent.

## 2023-10-19 NOTE — ED Provider Notes (Signed)
 [place] EMERGENCY DEPARTMENT AT [facility] Provider Note   CSN: [id number] Arrival date & time: 10/19/23  1338     Patient presents with: Facial Injury   Vincent Alvarez is a 83 y.o. male.    Facial Injury   83 year old male presents emergency department with facial injury.  States that he was working on a garage door in the spring released and hit him on his upper lip as well as nose.  Denies falling subsequently.  Denies hitting his head or losing consciousness.  Denies any pain/trauma elsewhere.  Denies any visual symptoms, gait abnormality, slurred speech, facial droop, weakness/sensory deficits in upper or lower extremities.  States he is on Plavix .  Past medical history significant for cryptogenic stroke, diabetes mellitus, diabetic retinopathy, hyperlipidemia, hypertension  Prior to Admission medications   Medication Sig Start Date End Date Taking? Authorizing Provider  acetaminophen  (TYLENOL ) 325 MG tablet Take 650 mg by mouth every 6 (six) hours as needed for moderate pain or headache.    [provider]  amLODipine  (NORVASC ) 5 MG tablet Take 5 mg by mouth every morning. 12/05/15   [provider]  Artificial Tear Ointment (DRY EYES OP) Place 1 drop into both eyes daily as needed (dry eyes).    [provider]  atorvastatin  (LIPITOR ) 80 MG tablet Take 1 tablet (80 mg total) by mouth every morning. 02/21/21 12/07/22  Arrien, Mauricio Daniel, MD  B Complex Vitamins (B COMPLEX PO) Take 1 tablet by mouth daily.    [provider]  clopidogrel  (PLAVIX ) 75 MG tablet Take 1 tablet (75 mg total) by mouth daily. Continue taking this medication 11/25/20   Jillian Buttery, MD  empagliflozin  (JARDIANCE ) 25 MG TABS tablet Take 1 tablet (25 mg total) by mouth daily before breakfast. 12/07/22   Ladona Heinz, MD  empagliflozin  (JARDIANCE ) 25 MG TABS tablet TAKE 1 TABLET DAILY BEFORE BREAKFAST 05/29/23   Ladona Heinz, MD  FLUoxetine  (PROZAC ) 20 MG  capsule Take 20 mg by mouth every morning.     [provider]  fluticasone  (FLONASE ) 50 MCG/ACT nasal spray Place 1 spray into both nostrils daily as needed for allergies. 09/14/15   [provider]  losartan  (COZAAR ) 100 MG tablet Take 100 mg by mouth daily. 10/30/20   [provider]  metFORMIN (GLUCOPHAGE) 500 MG tablet Take 1,000 mg by mouth 2 (two) times daily with a meal.     [provider]  sitaGLIPtin (JANUVIA) 100 MG tablet Take 100 mg by mouth daily.    [provider]  tamsulosin  (FLOMAX ) 0.4 MG CAPS capsule Take 0.4 mg by mouth daily after supper.     [provider]  traZODone  (DESYREL ) 150 MG tablet Take 50 mg by mouth at bedtime.    [provider]    Allergies: Shrimp [shellfish allergy]    Review of Systems  All other systems reviewed and are negative.   Updated Vital Signs BP 129/69 (BP Location: Right Arm)   Pulse 87   Temp 98 F (36.7 C)   Resp 16   Ht 5' 8 (1.727 m)   Wt 85.3 kg   SpO2 98%   BMI 28.59 kg/m   Physical Exam Vitals and nursing note reviewed.  Constitutional:      General: He is not in acute distress.    Appearance: He is well-developed.  HENT:     Head: Normocephalic.     Comments: Patient with 2 lacerations upper lip.  1 measuring 2.0 cm  just left of midline with 1 just lateral measuring 1.5 cm.  No vermilion border involvement bilaterally.  Additional left lower lip laceration measuring 2.0 cm.  No involvement of vermilion border.    Nose:     Comments: Patient with nose laceration left nare with skin avulsion.  Somewhat superficial.  Measures 2.5 cm.  No active bleeding.    Mouth/Throat:     Comments: Dentition in good repair without obvious fracture or dislocation.  No mandibular tenderness. Eyes:     Conjunctiva/sclera: Conjunctivae normal.  Cardiovascular:     Rate and Rhythm: Normal rate and regular rhythm.     Heart sounds: Murmur heard.  Pulmonary:     Effort:  Pulmonary effort is normal. No respiratory distress.     Breath sounds: Normal breath sounds.  Abdominal:     Palpations: Abdomen is soft.     Tenderness: There is no abdominal tenderness.  Musculoskeletal:        General: No swelling.     Cervical back: Neck supple.  Skin:    General: Skin is warm and dry.     Capillary Refill: Capillary refill takes less than 2 seconds.  Neurological:     Mental Status: He is alert.     Comments: Alert and oriented to self, place, time and event.   Speech is fluent, clear without dysarthria or dysphasia.   Strength symmetric in upper/lower extremities   Sensation intact in upper/lower extremities   Ambulates independently without difficulty CN I not tested  CN II grossly intact visual fields bilaterally. Did not visualize posterior eye.  CN III, IV, VI PERRLA and EOMs intact bilaterally  CN V Intact sensation to sharp and light touch to the face  CN VII facial movements symmetric  CN VIII not tested  CN IX, X no uvula deviation, symmetric rise of soft palate  CN XI symmetric SCM and trapezius strength bilaterally  CN XII Midline tongue protrusion, symmetric L/R movements     Psychiatric:        Mood and Affect: Mood normal.     (all labs ordered are listed, but only abnormal results are displayed) Labs Reviewed - No data to display  EKG: None  Radiology: No results found.   .Laceration Repair  Date/Time: 10/19/2023 6:53 PM  Performed by: Silver Wonda LABOR, PA Authorized by: Silver Wonda LABOR, PA   Consent:    Consent obtained:  Verbal   Consent given by:  Patient   Risks, benefits, and alternatives were discussed: yes     Risks discussed:  Infection, need for additional repair, poor wound healing, nerve damage, poor cosmetic result and pain   Alternatives discussed:  No treatment and delayed treatment Universal protocol:    Procedure explained and questions answered to patient or proxy's satisfaction: yes     Relevant  documents present and verified: yes     Patient identity confirmed:  Verbally with patient Anesthesia:    Anesthesia method:  Nerve block   Block needle gauge:  25 G   Block anesthetic:  Lidocaine  1% w/o epi   Block injection procedure:  Anatomic landmarks identified, anatomic landmarks palpated, negative aspiration for blood, introduced needle and incremental injection   Block outcome:  Anesthesia achieved Laceration details:    Location:  Lip   Lip location:  Upper interior lip   Length (cm):  2 Exploration:    Limited defect created (wound extended): no     Hemostasis achieved with:  Direct pressure  Imaging obtained comment:  Ct   Imaging outcome: foreign body not noted     Wound exploration: wound explored through full range of motion and entire depth of wound visualized     Contaminated: no   Treatment:    Area cleansed with:  Saline   Amount of cleaning:  Standard   Irrigation solution:  Sterile water   Irrigation volume:  250cc   Irrigation method:  Syringe   Visualized foreign bodies/material removed: no     Debridement:  None   Undermining:  None   Scar revision: no   Skin repair:    Repair method:  Sutures   Suture size:  5-0   Suture material:  Fast-absorbing gut   Suture technique:  Simple interrupted   Number of sutures:  5 Approximation:    Approximation:  Close   Vermilion border well-aligned: no   Repair type:    Repair type:  Complex Post-procedure details:    Dressing:  Open (no dressing)   Procedure completion:  Tolerated well, no immediate complications .Laceration Repair  Date/Time: 10/19/2023 6:54 PM  Performed by: Silver Wonda LABOR, PA Authorized by: Silver Wonda LABOR, PA   Consent:    Consent obtained:  Verbal   Consent given by:  Patient   Risks, benefits, and alternatives were discussed: yes     Risks discussed:  Nerve damage, poor wound healing, poor cosmetic result, pain, infection and need for additional repair   Alternatives  discussed:  No treatment and delayed treatment Universal protocol:    Procedure explained and questions answered to patient or proxy's satisfaction: yes     Patient identity confirmed:  Verbally with patient Anesthesia:    Anesthesia method:  Nerve block   Block needle gauge:  25 G   Block anesthetic:  Lidocaine  1% w/o epi   Block injection procedure:  Anatomic landmarks identified, anatomic landmarks palpated, negative aspiration for blood, introduced needle and incremental injection   Block outcome:  Anesthesia achieved Laceration details:    Location:  Lip   Lip location:  Upper interior lip   Length (cm):  1.5 Exploration:    Limited defect created (wound extended): no     Hemostasis achieved with:  Direct pressure   Imaging obtained comment:  Ct   Imaging outcome: foreign body not noted     Wound exploration: wound explored through full range of motion and entire depth of wound visualized     Contaminated: no   Treatment:    Area cleansed with:  Saline   Amount of cleaning:  Standard   Irrigation solution:  Sterile water   Irrigation volume:  250cc   Irrigation method:  Pressure wash   Visualized foreign bodies/material removed: no     Debridement:  None   Undermining:  None   Scar revision: no   Skin repair:    Repair method:  Sutures   Suture size:  5-0   Suture material:  Fast-absorbing gut   Suture technique:  Simple interrupted   Number of sutures:  4 Approximation:    Approximation:  Close   Vermilion border well-aligned: no   Repair type:    Repair type:  Intermediate Post-procedure details:    Dressing:  Open (no dressing)   Procedure completion:  Tolerated well, no immediate complications .Laceration Repair  Date/Time: 10/19/2023 6:55 PM  Performed by: Silver Wonda LABOR, PA Authorized by: Silver Wonda LABOR, PA   Consent:    Consent obtained:  Verbal   Consent  given by:  Patient   Risks, benefits, and alternatives were discussed: yes     Risks  discussed:  Infection, nerve damage, poor wound healing, poor cosmetic result, need for additional repair and pain   Alternatives discussed:  No treatment, delayed treatment and observation Universal protocol:    Procedure explained and questions answered to patient or proxy's satisfaction: yes     Relevant documents present and verified: yes     Patient identity confirmed:  Arm band and verbally with patient Anesthesia:    Anesthesia method:  Nerve block   Block needle gauge:  25 G   Block anesthetic:  Lidocaine  1% w/o epi   Block injection procedure:  Anatomic landmarks identified, anatomic landmarks palpated, negative aspiration for blood, introduced needle and incremental injection   Block outcome:  Anesthesia achieved Laceration details:    Location:  Lip   Lip location:  Lower exterior lip   Length (cm):  2 Pre-procedure details:    Preparation:  Patient was prepped and draped in usual sterile fashion and imaging obtained to evaluate for foreign bodies Exploration:    Limited defect created (wound extended): no     Hemostasis achieved with:  Direct pressure   Imaging obtained comment:  Ct   Imaging outcome: foreign body not noted     Wound exploration: wound explored through full range of motion and entire depth of wound visualized     Contaminated: no   Treatment:    Area cleansed with:  Saline   Amount of cleaning:  Standard   Irrigation solution:  Sterile water   Irrigation volume:  250cc   Irrigation method:  Syringe   Visualized foreign bodies/material removed: no     Debridement:  None   Undermining:  None   Scar revision: no   Skin repair:    Repair method:  Sutures   Suture size:  5-0   Suture material:  Fast-absorbing gut   Suture technique:  Simple interrupted   Number of sutures:  5 Approximation:    Approximation:  Close Repair type:    Repair type:  Intermediate Post-procedure details:    Dressing:  Open (no dressing)   Procedure completion:   Tolerated well, no immediate complications .Laceration Repair  Date/Time: 10/19/2023 6:56 PM  Performed by: Silver Wonda LABOR, PA Authorized by: Silver Wonda LABOR, PA   Consent:    Consent obtained:  Verbal   Consent given by:  Patient   Risks, benefits, and alternatives were discussed: yes     Risks discussed:  Need for additional repair, nerve damage, poor cosmetic result, pain, infection and poor wound healing   Alternatives discussed:  No treatment and delayed treatment Universal protocol:    Procedure explained and questions answered to patient or proxy's satisfaction: yes     Patient identity confirmed:  Verbally with patient Anesthesia:    Anesthesia method:  None Laceration details:    Location:  Face   Face location:  Nose   Length (cm):  2.5 Pre-procedure details:    Preparation:  Patient was prepped and draped in usual sterile fashion and imaging obtained to evaluate for foreign bodies Exploration:    Limited defect created (wound extended): no     Hemostasis achieved with:  Direct pressure   Imaging obtained comment:  Ct   Imaging outcome: foreign body not noted     Wound exploration: wound explored through full range of motion and entire depth of wound visualized     Contaminated: no  Treatment:    Area cleansed with:  Saline   Amount of cleaning:  Standard   Irrigation solution:  Sterile saline   Irrigation volume:  250cc   Irrigation method:  Pressure wash   Visualized foreign bodies/material removed: no     Debridement:  None   Undermining:  None   Scar revision: no   Skin repair:    Repair method:  Tissue adhesive Approximation:    Approximation:  Close Repair type:    Repair type:  Simple Post-procedure details:    Dressing:  Open (no dressing)   Procedure completion:  Tolerated well, no immediate complications    Medications Ordered in the ED  LORazepam  (ATIVAN ) tablet 0.5 mg (has no administration in time range)  lidocaine  (PF) (XYLOCAINE ) 1 %  injection 10 mL (10 mLs Other Given 10/19/23 1414)                                    Medical Decision Making Amount and/or Complexity of Data Reviewed Radiology: ordered.  Risk Prescription drug management.   This patient presents to the ED for concern of facial injury, this involves an extensive number of treatment options, and is a complaint that carries with it a high risk of complications and morbidity.  The differential diagnosis includes CVA, fracture, dislocation, laceration, other   Co morbidities that complicate the patient evaluation  See HPI   Additional history obtained:  Additional history obtained from EMR External records from outside source obtained and reviewed including hospital records   Lab Tests:  N/a   Imaging Studies ordered:  I ordered imaging studies including CT head/maxillofacial I independently visualized and interpreted imaging which showed no CT evidence of acute intracranial abnormality.  Focal irregularity anterior nasal spine and maxilla concerning for displaced fracture.  Soft tissue swelling middle upper lip.  Remote lacunar infarcts left basal ganglia and thalamus. I agree with the radiologist interpretation  Cardiac Monitoring: / EKG:  The patient was maintained on a cardiac monitor.  I personally viewed and interpreted the cardiac monitored which showed an underlying rhythm of: Rhythm   Consultations Obtained:  N/a   Problem List / ED Course / Critical interventions / Medication management  Facial injury, facial laceration, nose laceration I ordered medication including lidocaine , Ativan    Reevaluation of the patient after these medicines showed that the patient improved I have reviewed the patients home medicines and have made adjustments as needed   Social Determinants of Health:  Former cigarette use.  No illicit drug use.   Test / Admission - Considered:  Facial injury, facial laceration, nose laceration Vitals  signs within normal range and stable throughout visit. Imaging studies significant for: See above 83 year old male presents emergency department with facial injury.  States that he was working on a garage door in the spring released and hit him on his upper lip as well as nose.  Denies falling subsequently.  Denies hitting his head or losing consciousness.  Denies any pain/trauma elsewhere.  Denies any visual symptoms, gait abnormality, slurred speech, facial droop, weakness/sensory deficits in upper or lower extremities.  States he is on Plavix . On exam, nonfocal neurologic exam from baseline.  Patient with 3 lip lacerations as above as well as 1 nose laceration as above.  Lacerations repaired in manner as above.  CT imaging concerning for nasal bone fracture but otherwise unremarkable.  Will recommend follow-up with ENT in the  outpatient setting regarding nasal bone fracture.  Will recommend local wound care at home regarding lacerations.  Recommend symptomatic therapy described in AVS with close follow-up with PCP regarding lacerations.  Treatment plan discussed with patient and he acknowledged understanding was agreeable to said plan.  Patient overall well-appearing, afebrile in no acute distress. Worrisome signs and symptoms were discussed with the patient, and the patient acknowledged understanding to return to the ED if noticed. Patient was stable upon discharge.       Final diagnoses:  None    ED Discharge Orders     None          Silver Wonda LABOR, [place] 10/19/23 1858    Pamella Ozell LABOR, DO 10/20/23 [phone]

## 2023-10-19 NOTE — ED Notes (Signed)
 Fall risk bundle is currently in place.

## 2023-10-29 ENCOUNTER — Ambulatory Visit

## 2023-10-29 DIAGNOSIS — I639 Cerebral infarction, unspecified: Secondary | ICD-10-CM

## 2023-10-29 LAB — CUP PACEART REMOTE DEVICE CHECK
Date Time Interrogation Session: 20250714123114
Implantable Pulse Generator Implant Date: 20221214
Pulse Gen Model: 436066
Pulse Gen Serial Number: 94077050

## 2023-11-04 ENCOUNTER — Ambulatory Visit: Payer: Self-pay | Admitting: Cardiology

## 2023-11-15 NOTE — Progress Notes (Signed)
 [facility] Summary Report / Loop Recorder

## 2023-11-29 ENCOUNTER — Ambulatory Visit (INDEPENDENT_AMBULATORY_CARE_PROVIDER_SITE_OTHER)

## 2023-11-29 DIAGNOSIS — I639 Cerebral infarction, unspecified: Secondary | ICD-10-CM

## 2023-11-29 LAB — CUP PACEART REMOTE DEVICE CHECK
Date Time Interrogation Session: 20250814092249
Implantable Pulse Generator Implant Date: 20221214
Pulse Gen Model: 436066
Pulse Gen Serial Number: 94077050

## 2023-12-02 ENCOUNTER — Ambulatory Visit: Payer: Self-pay | Admitting: Cardiology

## 2023-12-12 DIAGNOSIS — M1712 Unilateral primary osteoarthritis, left knee: Secondary | ICD-10-CM | POA: Diagnosis not present

## 2023-12-28 ENCOUNTER — Encounter: Payer: Self-pay | Admitting: Podiatry

## 2023-12-28 ENCOUNTER — Ambulatory Visit (INDEPENDENT_AMBULATORY_CARE_PROVIDER_SITE_OTHER): Admitting: Podiatry

## 2023-12-28 DIAGNOSIS — M79674 Pain in right toe(s): Secondary | ICD-10-CM

## 2023-12-28 DIAGNOSIS — E1142 Type 2 diabetes mellitus with diabetic polyneuropathy: Secondary | ICD-10-CM

## 2023-12-28 DIAGNOSIS — B351 Tinea unguium: Secondary | ICD-10-CM

## 2023-12-28 DIAGNOSIS — M79675 Pain in left toe(s): Secondary | ICD-10-CM

## 2023-12-28 NOTE — Progress Notes (Signed)
  Subjective:  Patient ID: Vincent Alvarez, male    DOB: 11/30/1940,   MRN: [id number]  Chief Complaint  Patient presents with   Diabetes    Just a check up  Saw Dr. Carlin Gull - 09/14/23; A1c - 8.5    83 y.o. male presents for concern of thickened elongated and painful nails that are difficult to trim. Requesting to have them trimmed today. Denies burning and tingling in their feet. Patient is diabetic and last A1c was  Lab Results  Component Value Date   HGBA1C 6.7 (H) 02/19/2021   .   PCP:  Gull Carlin Redbird, MD    . Denies any other pedal complaints. Denies n/v/f/c.   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 CFT <3 seconds. Absent hair growth on digits. Edema noted to bilateral lower extremities. Xerosis noted bilaterally.  Skin. No lacerations or abrasions bilateral feet. Nails 1-5 bilateral  are thickened discolored and elongated with subungual debris.  Musculoskeletal: MMT 5/5 bilateral lower extremities in DF, PF, Inversion and Eversion. Deceased ROM in DF of ankle joint.  Neurological: Sensation intact to light touch. Protective sensation diminished bilateral.    Assessment:   1. Pain due to onychomycosis of toenails of both feet   2. Type 2 diabetes mellitus with peripheral neuropathy (HCC)      Plan:  Patient was evaluated and treated and all questions answered. -Discussed and educated patient on diabetic foot care, especially with  regards to the vascular, neurological and musculoskeletal systems.  -Stressed the importance of good glycemic control and the  detriment of not  controlling glucose levels in relation to the foot. -Discussed supportive shoes at all times and checking feet regularly.  -Mechanically debrided all nails 1-5 bilateral using sterile nail nipper and filed with dremel without incident  -Answered all patient questions -Patient to return  in 3 months for at risk foot care -Patient advised to call the office if any problems or questions arise in the meantime.   [name], DPM

## 2023-12-31 ENCOUNTER — Ambulatory Visit (INDEPENDENT_AMBULATORY_CARE_PROVIDER_SITE_OTHER)

## 2023-12-31 DIAGNOSIS — I639 Cerebral infarction, unspecified: Secondary | ICD-10-CM | POA: Diagnosis not present

## 2024-01-01 LAB — CUP PACEART REMOTE DEVICE CHECK
Date Time Interrogation Session: 20250915154346
Implantable Pulse Generator Implant Date: 20221214
Pulse Gen Model: 436066
Pulse Gen Serial Number: 94077050

## 2024-01-06 ENCOUNTER — Ambulatory Visit: Payer: Self-pay | Admitting: Cardiology

## 2024-01-07 NOTE — Progress Notes (Signed)
 Remote Loop Recorder Transmission

## 2024-01-09 NOTE — Progress Notes (Signed)
 Remote Loop Recorder Transmission

## 2024-01-23 NOTE — Progress Notes (Signed)
 Remote Loop Recorder Transmission

## 2024-01-31 ENCOUNTER — Ambulatory Visit (INDEPENDENT_AMBULATORY_CARE_PROVIDER_SITE_OTHER)

## 2024-01-31 DIAGNOSIS — I639 Cerebral infarction, unspecified: Secondary | ICD-10-CM | POA: Diagnosis not present

## 2024-01-31 LAB — CUP PACEART REMOTE DEVICE CHECK
Date Time Interrogation Session: 20251016095458
Implantable Pulse Generator Implant Date: 20221214
Pulse Gen Model: 436066
Pulse Gen Serial Number: 94077050

## 2024-02-07 NOTE — Progress Notes (Signed)
 Remote Loop Recorder Transmission

## 2024-02-09 ENCOUNTER — Ambulatory Visit: Payer: Self-pay | Admitting: Cardiology

## 2024-02-25 DIAGNOSIS — Z23 Encounter for immunization: Secondary | ICD-10-CM | POA: Diagnosis not present

## 2024-02-25 DIAGNOSIS — R519 Headache, unspecified: Secondary | ICD-10-CM | POA: Diagnosis not present

## 2024-03-03 ENCOUNTER — Ambulatory Visit (INDEPENDENT_AMBULATORY_CARE_PROVIDER_SITE_OTHER)

## 2024-03-03 DIAGNOSIS — I639 Cerebral infarction, unspecified: Secondary | ICD-10-CM | POA: Diagnosis not present

## 2024-03-03 LAB — CUP PACEART REMOTE DEVICE CHECK
Date Time Interrogation Session: 20251117105945
Implantable Pulse Generator Implant Date: 20221214
Pulse Gen Model: 436066
Pulse Gen Serial Number: 94077050

## 2024-03-05 ENCOUNTER — Ambulatory Visit: Payer: Self-pay | Admitting: Cardiology

## 2024-03-05 NOTE — Progress Notes (Signed)
 Remote Loop Recorder Transmission

## 2024-03-11 ENCOUNTER — Encounter: Payer: Self-pay | Admitting: Podiatry

## 2024-03-11 ENCOUNTER — Ambulatory Visit (INDEPENDENT_AMBULATORY_CARE_PROVIDER_SITE_OTHER): Admitting: Podiatry

## 2024-03-11 DIAGNOSIS — E1142 Type 2 diabetes mellitus with diabetic polyneuropathy: Secondary | ICD-10-CM

## 2024-03-11 DIAGNOSIS — M79674 Pain in right toe(s): Secondary | ICD-10-CM | POA: Diagnosis not present

## 2024-03-11 DIAGNOSIS — B351 Tinea unguium: Secondary | ICD-10-CM | POA: Diagnosis not present

## 2024-03-11 DIAGNOSIS — M79675 Pain in left toe(s): Secondary | ICD-10-CM

## 2024-03-11 NOTE — Progress Notes (Signed)
  Subjective:  Patient ID: Vincent Alvarez, male    DOB: 05/19/1940,   MRN: [id number]  Chief Complaint  Patient presents with   Diabetes    My toenails and check my feet. Saw Dr. Carlin Gull - 07/25; A1c - 8.2    83 y.o. male presents for concern of thickened elongated and painful nails that are difficult to trim. Requesting to have them trimmed today. Denies burning and tingling in their feet. Patient is diabetic and last A1c was  Lab Results  Component Value Date   HGBA1C 6.7 (H) 02/19/2021   .   PCP:  Gull Carlin Redbird, MD    . Denies any other pedal complaints. Denies n/v/f/c.   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 CFT <3 seconds. Absent hair growth on digits. Edema noted to bilateral lower extremities. Xerosis noted bilaterally.  Skin. No lacerations or abrasions bilateral feet. Nails 1-5 bilateral  are thickened discolored and elongated with subungual debris.  Musculoskeletal: MMT 5/5 bilateral lower extremities in DF, PF, Inversion and Eversion. Deceased ROM in DF of ankle joint.  Neurological: Sensation intact to light touch. Protective sensation diminished bilateral.    Assessment:   1. Pain due to onychomycosis of toenails of both feet   2. Type 2 diabetes mellitus with peripheral neuropathy (HCC)       Plan:  Patient was evaluated and treated and all questions answered. -Discussed and educated patient on diabetic foot care, especially with  regards to the vascular, neurological and musculoskeletal systems.  -Stressed the importance of good glycemic control  and the detriment of not  controlling glucose levels in relation to the foot. -Discussed supportive shoes at all times and checking feet regularly.  -Mechanically debrided all nails 1-5 bilateral using sterile nail nipper and filed with dremel without incident  -Answered all patient questions -Patient to return  in 3 months for at risk foot care -Patient advised to call the office if any problems or questions arise in the meantime.   [name], DPM

## 2024-03-17 DIAGNOSIS — M170 Bilateral primary osteoarthritis of knee: Secondary | ICD-10-CM | POA: Diagnosis not present

## 2024-03-17 DIAGNOSIS — M1712 Unilateral primary osteoarthritis, left knee: Secondary | ICD-10-CM | POA: Diagnosis not present

## 2024-03-17 DIAGNOSIS — E119 Type 2 diabetes mellitus without complications: Secondary | ICD-10-CM | POA: Diagnosis not present

## 2024-03-17 DIAGNOSIS — I739 Peripheral vascular disease, unspecified: Secondary | ICD-10-CM | POA: Diagnosis not present

## 2024-03-17 DIAGNOSIS — Z7984 Long term (current) use of oral hypoglycemic drugs: Secondary | ICD-10-CM | POA: Diagnosis not present

## 2024-03-25 DIAGNOSIS — Z961 Presence of intraocular lens: Secondary | ICD-10-CM | POA: Diagnosis not present

## 2024-03-25 DIAGNOSIS — E113293 Type 2 diabetes mellitus with mild nonproliferative diabetic retinopathy without macular edema, bilateral: Secondary | ICD-10-CM | POA: Diagnosis not present

## 2024-03-25 DIAGNOSIS — H353131 Nonexudative age-related macular degeneration, bilateral, early dry stage: Secondary | ICD-10-CM | POA: Diagnosis not present

## 2024-03-25 DIAGNOSIS — H52203 Unspecified astigmatism, bilateral: Secondary | ICD-10-CM | POA: Diagnosis not present

## 2024-03-25 DIAGNOSIS — H524 Presbyopia: Secondary | ICD-10-CM | POA: Diagnosis not present

## 2024-03-28 ENCOUNTER — Ambulatory Visit: Admitting: Podiatry

## 2024-03-28 DIAGNOSIS — E11319 Type 2 diabetes mellitus with unspecified diabetic retinopathy without macular edema: Secondary | ICD-10-CM | POA: Diagnosis not present

## 2024-03-28 DIAGNOSIS — Z6830 Body mass index (BMI) 30.0-30.9, adult: Secondary | ICD-10-CM | POA: Diagnosis not present

## 2024-03-28 DIAGNOSIS — R079 Chest pain, unspecified: Secondary | ICD-10-CM | POA: Diagnosis not present

## 2024-03-28 DIAGNOSIS — F324 Major depressive disorder, single episode, in partial remission: Secondary | ICD-10-CM | POA: Diagnosis not present

## 2024-04-03 ENCOUNTER — Encounter

## 2024-04-03 ENCOUNTER — Ambulatory Visit: Payer: Self-pay | Admitting: Cardiology

## 2024-04-03 DIAGNOSIS — I639 Cerebral infarction, unspecified: Secondary | ICD-10-CM

## 2024-04-03 LAB — CUP PACEART REMOTE DEVICE CHECK
Date Time Interrogation Session: 20251218095053
Implantable Pulse Generator Implant Date: 20221214
Pulse Gen Model: 436066
Pulse Gen Serial Number: 94077050

## 2024-04-04 NOTE — Progress Notes (Signed)
 Remote Loop Recorder Transmission

## 2024-05-05 ENCOUNTER — Ambulatory Visit

## 2024-05-05 DIAGNOSIS — I639 Cerebral infarction, unspecified: Secondary | ICD-10-CM | POA: Diagnosis not present

## 2024-05-07 LAB — CUP PACEART REMOTE DEVICE CHECK
Date Time Interrogation Session: 20260121103717
Implantable Pulse Generator Implant Date: 20221214
Pulse Gen Model: 436066
Pulse Gen Serial Number: 94077050

## 2024-05-09 NOTE — Progress Notes (Signed)
 Remote Loop Recorder Transmission

## 2024-05-11 ENCOUNTER — Ambulatory Visit: Payer: Self-pay | Admitting: Cardiology

## 2024-05-12 ENCOUNTER — Ambulatory Visit: Admitting: Cardiology

## 2024-05-15 ENCOUNTER — Encounter: Payer: Self-pay | Admitting: Cardiology

## 2024-05-15 ENCOUNTER — Ambulatory Visit: Attending: Cardiology | Admitting: Cardiology

## 2024-05-15 ENCOUNTER — Other Ambulatory Visit (HOSPITAL_COMMUNITY): Payer: Self-pay

## 2024-05-15 VITALS — BP 100/52 | HR 71 | Ht 68.0 in | Wt 191.0 lb

## 2024-05-15 DIAGNOSIS — Z95818 Presence of other cardiac implants and grafts: Secondary | ICD-10-CM | POA: Insufficient documentation

## 2024-05-15 DIAGNOSIS — Z8673 Personal history of transient ischemic attack (TIA), and cerebral infarction without residual deficits: Secondary | ICD-10-CM | POA: Insufficient documentation

## 2024-05-15 DIAGNOSIS — I350 Nonrheumatic aortic (valve) stenosis: Secondary | ICD-10-CM | POA: Insufficient documentation

## 2024-05-15 DIAGNOSIS — I209 Angina pectoris, unspecified: Secondary | ICD-10-CM | POA: Insufficient documentation

## 2024-05-15 DIAGNOSIS — R0989 Other specified symptoms and signs involving the circulatory and respiratory systems: Secondary | ICD-10-CM | POA: Diagnosis present

## 2024-05-15 MED ORDER — ISOSORBIDE MONONITRATE ER 30 MG PO TB24
30.0000 mg | ORAL_TABLET | Freq: Every day | ORAL | 2 refills | Status: AC
  Filled 2024-05-15: qty 30, 30d supply, fill #0

## 2024-05-15 NOTE — Patient Instructions (Signed)
 Medication Instructions:   START  isosorbide  mononitrate (IMDUR ) 30 MG 24 hr tablet        Take 1 tablet (30 mg total) by mouth daily   DECREASE :   losartan  (COZAAR ) 100 MG tablet : Take 50 mg by mouth daily. Reduce to 1/2 tab daily.  *If you need a refill on your cardiac medications before your next appointment, please call your pharmacy*   Testing/Procedures: CAROTID ARTERY DUPLEX  Your physician has requested that you have a carotid duplex. This test is an ultrasound of the carotid arteries in your neck. It looks at blood flow through these arteries that supply the brain with blood. Allow one hour for this exam. There are no restrictions or special instructions.   STRESS TEST  Your physician has requested that you have a Myocardial Perfusion Imaging Study.  Please arrive 15 minutes prior to your appointment time for registration.  The test will take approximately 3 to 4 hours to complete.  Instructions: You may take your medications the morning of the test (someone from the testing area will call you with these specific instructions). Do not eat or drink 3 hours prior to your test, except you may have unlimited water. Do not consume products containing caffeine (regular or decaffeinated) for 12 hours prior to your test (coffee, tea, soda, chocolate, energy products and some medications). Please wear a 2-piece outfit (no dresses, overalls, etc.) Do not wear cologne, perfume, aftershave or lotions (deodorant is allowed) If you use an inhaler, bring it with you to the test.  Please note: If anyone comes with you to the appointment, they will need to remain in the main lobby due to limited space in the testing area. We also ask that you not bring children with you during testing. Due to room size and safety concerns, children are not allowed in the testing rooms during exams. Our front office staff cannot provide observation of children in our lobby area while testing is being  conducted.  **If you are pregnant or breastfeeding, please notify the technologists prior to your appointment.**   Follow-Up: At [facility], you and your health needs are our priority.  As part of our continuing mission to provide you with exceptional heart care, our providers are all part of one team.  This team includes your primary Cardiologist (physician) and Advanced Practice Providers or APPs (Physician Assistants and Nurse Practitioners) who all work together to provide you with the care you need, when you need it.  Your next appointment:   June 26, 2024 @ 11:40am  Provider:   Gordy Bergamo, MD   We recommend signing up for the patient portal called MyChart.  Patients are able to view lab/test results, encounter notes, upcoming appointments, etc.  Non-urgent messages can be sent to your provider as well, go to [email or web address].

## 2024-05-15 NOTE — Progress Notes (Addendum)
 " Cardiology Office Note:  .   Date:  05/15/2024  ID:  Vincent Alvarez, DOB 11-Sep-1940, MRN [id number] PCP: Okey Carlin Redbird, MD  [facility] Providers Cardiologist:  Gordy Bergamo, MD   History of Present Illness: Vincent Alvarez is a 84 y.o. male with history of diabetes, hypertension, hyperlipidemia, hearing loss, gout and depression, recurrent TIAs and cryptogenic stroke, first in 2017 and most recently November 2022 underwent loop recorder implantation on 03/30/2021.  He was referred back to me by Dr. Redbird Okey to evaluate recent chest pain symptoms suggestive of angina pectoris. He is by himself, also lives independently and by himself, and is considering moving into an assisted living facility.  He has no children.    Discussed the use of AI scribe software for clinical note transcription with the patient, who gave verbal consent to proceed.  History of Present Illness Vincent Alvarez is an 84 year old male who presents with chest pain. He was referred by his regular doctor for evaluation of chest pain.  He has had left-sided chest pain for two months. The pain sometimes radiates, is noticeable but not sharp, occurs with exertion such as walking to the mailbox or chopping ice, and resolves with rest.  He is on Mounjaro injections with significant weight loss over the past year due to appetite suppression.  He has a known heart murmur. He lives independently but is considering assisted living because tasks like shoveling snow are becoming difficult.  He has had no syncope or presyncope, though he sometimes stumbles while walking.  He had a single brief episode of tachycardia during a nightmare that was frightening but resolved.  He does not regularly check his blood pressure but recalls recent readings around 100/50 mmHg, lower than his prior usual of about 120/60-65 mmHg.  Cardiac Studies relevent.    Echocardiogram 10/03/2022: Normal LV systolic function, grade  LVH, EF 65 to 70%. Moderately enlarged left atrium. Mild aortic valve stenosis with a valve area of 1.6 cm with a mean gradient of 12 mmHg.  Remote loop recorder transmission January 2026: Loop recorder report reviewed. Appropriate device function. No new symptom, brady, pause or AF episodes. 1 tachy episode, lasting 20 seconds. Battery status OK. Continue remote monitoring.   EKG:   EKG Interpretation Date/Time:  Thursday May 15 2024 15:48:43 EST Ventricular Rate:  71 PR Interval:  144 QRS Duration:  132 QT Interval:  406 QTC Calculation: 441 R Axis:   -83  Text Interpretation: EKG 05/15/2024: Normal sinus rhythm at rate of 71 bpm, left anterior fascicular block.  Right bundle branch block.  Bifascicular block.  Compared to 02/18/2021, no change. Confirmed by Bergamo Gordy [id number]) on 05/15/2024 3:55:47 PM  Labs   Care everywhere/Faxed External Labs:  Labs 07/23/2023:  Total cholesterol 88, HDL 36, LDL 23, triglycerides 105.  Labs 03/28/2024:  A1c 9.8%.  ROS  Review of Systems  Cardiovascular:  Positive for chest pain. Negative for dyspnea on exertion and leg swelling.   Physical Exam:   VS:  BP (!) 100/52 (BP Location: Left Arm, Patient Position: Sitting, Cuff Size: Normal)   Pulse 71   Ht 5' 8 (1.727 m)   Wt 191 lb (86.6 kg)   SpO2 97%   BMI 29.04 kg/m    Wt Readings from Last 3 Encounters:  05/15/24 191 lb (86.6 kg)  10/19/23 188 lb (85.3 kg)  03/12/23 206 lb 6.4 oz (93.6 kg)    BP Readings from  Last 3 Encounters:  05/15/24 (!) 100/52  10/19/23 129/69  03/12/23 (!) 112/58   Physical Exam Neck:     Vascular: No JVD.  Cardiovascular:     Rate and Rhythm: Normal rate and regular rhythm.     Pulses: Intact distal pulses.          Carotid pulses are  on the right side with bruit and  on the left side with bruit.      Popliteal pulses are 2+ on the right side and 2+ on the left side.       Dorsalis pedis pulses are 0 on the right side and 0 on the left side.        Posterior tibial pulses are 0 on the right side and 0 on the left side.     Heart sounds: S1 normal and S2 normal. Murmur heard.     Early systolic murmur is present with a grade of 2/6 at the upper right sternal border.     No gallop.  Pulmonary:     Effort: Pulmonary effort is normal.     Breath sounds: Normal breath sounds.  Abdominal:     General: Bowel sounds are normal.     Palpations: Abdomen is soft.  Musculoskeletal:     Right lower leg: No edema.     Left lower leg: No edema.     ASSESSMENT AND PLAN: .      ICD-10-CM   1. 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 Mild aortic stenosis  I35.0 EKG 12-Lead    3. History of cerebrovascular accident  Z60.73     4. Loop recorder Biotronik Biomonitor III 03/30/2021  Z95.818     5. Bilateral carotid bruits  R09.89 VAS US  CAROTID     Assessment & Plan Angina pectoris Intermittent chest pain for two months, located on the left side, sometimes radiating. Pain presents during exertional activity is relieved by rest. No associated syncope or severe symptoms. Recent weight loss with Mounjaro use. - Prescribed isosorbide  mononitrate 30 mg once daily for chest pain. - Ordered chemical stress test to evaluate for major blockages. - Ordered carotid ultrasound to assess for plaque buildup.  Mild aortic stenosis No change in heart murmur, echocardiogram in 2024 revealing very mild aortic stenosis with a mean gradient of only 12 mmHg.  Bilateral carotid bruits Needs carotid artery duplex, this will be scheduled.  Primary hypertension Blood pressure readings have been low, around 100/50 mmHg. Current medication includes losartan . - Instructed to reduce losartan  from 100 mg to 50 mg dosage due to low blood  pressure.  Type 2 diabetes mellitus Diabetes management ongoing with current medications as per PCP, presently on metformin and Mounjaro and has consistently been losing weight. - Suspect weight loss has contributed to his low blood pressure as well.   Follow up: 2 months for follow-up of stress test and carotid artery duplex.  Total time spent 41 minutes today.  This included evaluation of his prior medical records, evaluation of his labs and discussion regarding multiple system involvement  Signed,  Gordy Bergamo, MD, [facility] 05/15/2024, 5:57 PM [facility] [address] [place], [place] [address] Phone: [phone]. Fax:  [phone]  "

## 2024-05-16 ENCOUNTER — Telehealth (HOSPITAL_COMMUNITY): Payer: Self-pay | Admitting: *Deleted

## 2024-05-16 NOTE — Telephone Encounter (Signed)
 Spoke to patient to remind him about his STRESS TEST on 05/23/24 at 7:45. He stated that he read his instructions had no questions and he will be here for his test on Friday.

## 2024-05-20 ENCOUNTER — Other Ambulatory Visit: Payer: Self-pay | Admitting: Cardiology

## 2024-05-20 DIAGNOSIS — I209 Angina pectoris, unspecified: Secondary | ICD-10-CM

## 2024-05-23 ENCOUNTER — Ambulatory Visit: Payer: Self-pay | Admitting: Cardiology

## 2024-05-23 ENCOUNTER — Inpatient Hospital Stay (HOSPITAL_COMMUNITY): Admission: RE | Admit: 2024-05-23

## 2024-05-23 DIAGNOSIS — I209 Angina pectoris, unspecified: Secondary | ICD-10-CM

## 2024-05-23 LAB — MYOCARDIAL PERFUSION IMAGING
LV dias vol: 78 mL (ref 62–150)
LV sys vol: 25 mL
Nuc Stress EF: 68 %
Peak HR: 98 {beats}/min
Rest HR: 71 {beats}/min
Rest Nuclear Isotope Dose: 10.9 mCi
SDS: 0
SRS: 0
SSS: 0
ST Depression (mm): 0 mm
Stress Nuclear Isotope Dose: 32.1 mCi
TID: 1.24

## 2024-05-23 MED ORDER — TECHNETIUM TC 99M TETROFOSMIN IV KIT
10.9000 | PACK | Freq: Once | INTRAVENOUS | Status: AC | PRN
  Administered 2024-05-23: 10.9 via INTRAVENOUS

## 2024-05-23 MED ORDER — REGADENOSON 0.4 MG/5ML IV SOLN
0.4000 mg | Freq: Once | INTRAVENOUS | Status: AC
  Administered 2024-05-23: 0.4 mg via INTRAVENOUS

## 2024-05-23 MED ORDER — REGADENOSON 0.4 MG/5ML IV SOLN
INTRAVENOUS | Status: AC
  Filled 2024-05-23: qty 5

## 2024-05-23 MED ORDER — TECHNETIUM TC 99M TETROFOSMIN IV KIT
32.1000 | PACK | Freq: Once | INTRAVENOUS | Status: AC | PRN
  Administered 2024-05-23: 32.1 via INTRAVENOUS

## 2024-05-27 ENCOUNTER — Ambulatory Visit (HOSPITAL_COMMUNITY)

## 2024-06-05 ENCOUNTER — Encounter

## 2024-06-17 ENCOUNTER — Ambulatory Visit: Admitting: Podiatry

## 2024-06-26 ENCOUNTER — Ambulatory Visit: Admitting: Cardiology

## 2024-07-07 ENCOUNTER — Encounter

## 2024-08-07 ENCOUNTER — Encounter
